# Patient Record
Sex: Female | Born: 1945
Health system: Southern US, Community
[De-identification: ages and names within clinical notes are randomized; demographics above are authoritative.]

## PROBLEM LIST (undated history)

## (undated) DIAGNOSIS — Z9289 Personal history of other medical treatment: Secondary | ICD-10-CM

## (undated) DIAGNOSIS — F329 Major depressive disorder, single episode, unspecified: Secondary | ICD-10-CM

## (undated) DIAGNOSIS — E119 Type 2 diabetes mellitus without complications: Secondary | ICD-10-CM

## (undated) DIAGNOSIS — R059 Cough, unspecified: Secondary | ICD-10-CM

## (undated) DIAGNOSIS — Z950 Presence of cardiac pacemaker: Secondary | ICD-10-CM

## (undated) DIAGNOSIS — M2550 Pain in unspecified joint: Secondary | ICD-10-CM

## (undated) DIAGNOSIS — E785 Hyperlipidemia, unspecified: Secondary | ICD-10-CM

## (undated) DIAGNOSIS — D649 Anemia, unspecified: Secondary | ICD-10-CM

## (undated) DIAGNOSIS — I495 Sick sinus syndrome: Secondary | ICD-10-CM

## (undated) DIAGNOSIS — J302 Other seasonal allergic rhinitis: Secondary | ICD-10-CM

## (undated) DIAGNOSIS — Z8489 Family history of other specified conditions: Secondary | ICD-10-CM

## (undated) DIAGNOSIS — R05 Cough: Secondary | ICD-10-CM

## (undated) DIAGNOSIS — F32A Depression, unspecified: Secondary | ICD-10-CM

## (undated) DIAGNOSIS — Z87440 Personal history of urinary (tract) infections: Secondary | ICD-10-CM

## (undated) DIAGNOSIS — M2540 Effusion, unspecified joint: Secondary | ICD-10-CM

## (undated) DIAGNOSIS — M1990 Unspecified osteoarthritis, unspecified site: Secondary | ICD-10-CM

## (undated) HISTORY — PX: CHOLECYSTECTOMY: SHX55

## (undated) HISTORY — PX: INSERT / REPLACE / REMOVE PACEMAKER: SUR710

## (undated) HISTORY — DX: Sick sinus syndrome: I49.5

## (undated) HISTORY — PX: ABDOMINAL HYSTERECTOMY: SHX81

## (undated) HISTORY — PX: COLONOSCOPY: SHX174

## (undated) HISTORY — PX: OTHER SURGICAL HISTORY: SHX169

---

## 2005-05-28 ENCOUNTER — Inpatient Hospital Stay (HOSPITAL_COMMUNITY): Admission: AD | Admit: 2005-05-28 | Discharge: 2005-06-01 | Payer: Self-pay | Admitting: Cardiology

## 2005-05-29 ENCOUNTER — Encounter: Payer: Self-pay | Admitting: Cardiology

## 2005-05-30 ENCOUNTER — Ambulatory Visit: Payer: Self-pay | Admitting: Internal Medicine

## 2005-06-14 ENCOUNTER — Ambulatory Visit: Payer: Self-pay

## 2005-06-20 ENCOUNTER — Ambulatory Visit: Payer: Self-pay | Admitting: Cardiology

## 2005-09-19 ENCOUNTER — Ambulatory Visit: Payer: Self-pay | Admitting: Internal Medicine

## 2006-06-25 ENCOUNTER — Ambulatory Visit: Payer: Self-pay

## 2006-09-26 ENCOUNTER — Ambulatory Visit: Payer: Self-pay | Admitting: Internal Medicine

## 2006-12-26 ENCOUNTER — Ambulatory Visit: Payer: Self-pay | Admitting: Internal Medicine

## 2007-03-27 ENCOUNTER — Ambulatory Visit: Payer: Self-pay | Admitting: Internal Medicine

## 2007-06-19 ENCOUNTER — Ambulatory Visit: Payer: Self-pay | Admitting: Internal Medicine

## 2007-09-26 ENCOUNTER — Ambulatory Visit: Payer: Self-pay | Admitting: Internal Medicine

## 2007-12-25 ENCOUNTER — Ambulatory Visit: Payer: Self-pay | Admitting: Internal Medicine

## 2008-03-25 ENCOUNTER — Ambulatory Visit: Payer: Self-pay | Admitting: Internal Medicine

## 2008-06-03 ENCOUNTER — Ambulatory Visit: Payer: Self-pay | Admitting: Internal Medicine

## 2008-09-02 ENCOUNTER — Ambulatory Visit: Payer: Self-pay | Admitting: Internal Medicine

## 2008-11-20 ENCOUNTER — Encounter (INDEPENDENT_AMBULATORY_CARE_PROVIDER_SITE_OTHER): Payer: Self-pay | Admitting: *Deleted

## 2008-12-04 ENCOUNTER — Encounter: Payer: Self-pay | Admitting: Internal Medicine

## 2008-12-14 ENCOUNTER — Ambulatory Visit: Payer: Self-pay | Admitting: Internal Medicine

## 2008-12-21 ENCOUNTER — Encounter: Payer: Self-pay | Admitting: Internal Medicine

## 2009-02-02 DIAGNOSIS — I10 Essential (primary) hypertension: Secondary | ICD-10-CM | POA: Insufficient documentation

## 2009-02-02 DIAGNOSIS — M069 Rheumatoid arthritis, unspecified: Secondary | ICD-10-CM | POA: Insufficient documentation

## 2009-02-02 DIAGNOSIS — I498 Other specified cardiac arrhythmias: Secondary | ICD-10-CM

## 2009-02-02 DIAGNOSIS — Z950 Presence of cardiac pacemaker: Secondary | ICD-10-CM

## 2009-02-09 ENCOUNTER — Encounter: Admission: RE | Admit: 2009-02-09 | Discharge: 2009-02-09 | Payer: Self-pay | Admitting: Rheumatology

## 2009-03-19 ENCOUNTER — Encounter: Payer: Self-pay | Admitting: Internal Medicine

## 2009-03-23 ENCOUNTER — Ambulatory Visit: Payer: Self-pay | Admitting: Internal Medicine

## 2009-04-07 ENCOUNTER — Encounter: Payer: Self-pay | Admitting: Internal Medicine

## 2009-07-26 ENCOUNTER — Telehealth: Payer: Self-pay | Admitting: Internal Medicine

## 2009-07-27 ENCOUNTER — Encounter: Payer: Self-pay | Admitting: Internal Medicine

## 2009-08-19 ENCOUNTER — Encounter: Payer: Self-pay | Admitting: Internal Medicine

## 2009-10-28 ENCOUNTER — Encounter: Payer: Self-pay | Admitting: Internal Medicine

## 2009-11-03 ENCOUNTER — Encounter: Payer: Self-pay | Admitting: Internal Medicine

## 2010-02-28 ENCOUNTER — Encounter (INDEPENDENT_AMBULATORY_CARE_PROVIDER_SITE_OTHER): Payer: Self-pay | Admitting: *Deleted

## 2010-05-27 ENCOUNTER — Encounter (INDEPENDENT_AMBULATORY_CARE_PROVIDER_SITE_OTHER): Payer: Self-pay | Admitting: *Deleted

## 2010-06-14 ENCOUNTER — Ambulatory Visit: Payer: Self-pay | Admitting: Internal Medicine

## 2010-08-25 ENCOUNTER — Encounter: Payer: Self-pay | Admitting: Internal Medicine

## 2010-08-30 NOTE — Letter (Signed)
Summary: Device-Delinquent Check  [facility], Main Office  [address]   [place], [place] [address]   Phone: [phone]  Fax: [phone]     February 28, 2010 MRN: [id number]   Tracey Koch [address] [place], [place]  [address]   Dear Ms. Robison,  According to our records, you have not had your implanted device checked in the recommended period of time.  We are unable to determine appropriate device function without checking your device on a regular basis.  Please call our office to schedule an appointment as soon as possible.  If you are having your device checked by another physician, please call us so that we may update our records.  Thank you,   [facility]

## 2010-08-30 NOTE — Assessment & Plan Note (Signed)
Summary: ROV   Visit Type:  Follow-up Primary Hellen Shanley:  Tracey Koch  CC:  no cardiology complaints.  History of Present Illness: Mrs. Frick returns today for PPM followup.  She is a pleasant 65 yo woman with a h/o symptomatic brdaycardia, s/p PPM.  She has a long h/o arthritis and has required chronic steroids and methotrexate. She denies c/p, sob, or peripheral edema.  Current Medications (verified): 1)  Paroxetine Hcl 20 Mg Tabs (Paroxetine Hcl) .... Take 1 Tab At Bedtime 2)  Aldactone 25 Mg Tabs (Spironolactone) .... Take 1 Tab Daily 3)  Tramadol Hcl 50 Mg Tabs (Tramadol Hcl) .... Take As Needed For Pain 4)  Trazodone Hcl 150 Mg Tabs (Trazodone Hcl) .... Take 1 Tab At Bedtime 5)  Methotrexate 2.5 Mg Tabs (Methotrexate Sodium) .... Take 5 Tabs Every Tuesday and Wednesday 6)  Furosemide 40 Mg Tabs (Furosemide) .... Take 1 Tab Daily 7)  Prednisone 10 Mg Tabs (Prednisone) .... Take 1 Tab Daily 8)  Aspir-Low 81 Mg Tbec (Aspirin) .... Take 1 Tab Daily 9)  Simvastatin 20 Mg Tabs (Simvastatin) .... Take 1 Tab At Bedtime 10)  Metformin Hcl 500 Mg Tabs (Metformin Hcl) .... Take 1 Tab Every Otherday  Allergies (verified): No Known Drug Allergies  Comments:  Nurse/Medical Assistant: patient takes metformin 500 mg everyother day   Past History:  Past Medical History: Last updated: 02/02/2009 ARTHRITIS, RHEUMATOID (ICD-714.0) HYPERTENSION, UNSPECIFIED (ICD-401.9) PACEMAKER (ICD-V45.Marland Kitchen01) BRADYCARDIA (ICD-427.89)    Past Surgical History: Last updated: 02/02/2009 PCM -  Medtronic Adapta ADDR-01 - Symptomatic bradycardia. 06/09/2005  Vital Signs:  Patient profile:   66 year old female Height:      65 inches Weight:      174 pounds BMI:     29.06 Pulse rate:   80 / minute BP sitting:   110 / 78  (right arm)  Vitals Entered By: Dreama Saa, CNA (June 14, 2010 3:57 PM)  Physical Exam  General:  Well developed, well nourished, in no acute distress.  HEENT:  normal Neck: supple. No JVD. Carotids 2+ bilaterally no bruits Cor: RRR no rubs, gallops or murmur Lungs: CTA Ab: soft, nontender. nondistended. No HSM. Good bowel sounds Ext: warm. no cyanosis, clubbing or edema Neuro: alert and oriented. Grossly nonfocal. affect pleasant    PPM Specifications Following MD:  Lewayne Bunting, MD     PPM Vendor:  Medtronic     PPM Model Number:  ADDR01     PPM Serial Number:  [id number] PPM DOI:  05/31/2005     PPM Implanting MD:  Lewayne Bunting, MD  Lead 1    Location: RA     DOI: 05/31/2005     Model #: 0981     Serial #: [id number]     Status: active Lead 2    Location: RV     DOI: 05/31/2005     Model #: 2130     Serial #: [id number]     Status: active  Magnet Response Rate:  BOL 85 ERI 65  Indications:  Sick sinus syndrome   PPM Follow Up Remote Check?  No Battery Voltage:  2.76 V     Battery Est. Longevity:  5.5 years     Pacer Dependent:  No       PPM Device Measurements Atrium  Amplitude: 2.8 mV, Impedance: 606 ohms, Threshold: 0.375 V at 0.4 msec Right Ventricle  Amplitude: 16 mV, Impedance: 730 ohms, Threshold: 0.5 V at 0.4 msec  Episodes MS Episodes:  4  Percent Mode Switch:  <0.1%     Coumadin:  No Ventricular High Rate:  0     Atrial Pacing:  16.6%     Ventricular Pacing:  0.4%  Parameters Mode:  DDDR+     Lower Rate Limit:  60     Upper Rate Limit:  140 Paced AV Delay:  150     Sensed AV Delay:  120 Next Remote Date:  09/15/2010     Next Cardiology Appt Due:  06/01/2011 Tech Comments:  No parameter changes.  Device function normal.  Carelink transmissions every 3 months.  ROV 1 year with  Dr. Ladona Ridgel in [place]. Altha Harm, LPN  June 14, 2010 4:32 PM  MD Comments:  Agree with above.  Impression & Recommendations:  Problem # 1:  PACEMAKER, PERMANENT (ICD-V45.01) Her device is working normally.  Will recheck in several months.  Problem # 2:  HYPERTENSION, UNSPECIFIED (ICD-401.9) Her blood pressure is well controlled. She  will continue her meds as below. Her updated medication list for this problem includes:    Aldactone 25 Mg Tabs (Spironolactone) .Marland Kitchen... Take 1 tab daily    Furosemide 40 Mg Tabs (Furosemide) .Marland Kitchen... Take 1 tab daily    Aspir-low 81 Mg Tbec (Aspirin) .Marland Kitchen... Take 1 tab daily

## 2010-08-30 NOTE — Letter (Signed)
Summary: Remote Device Check  [facility], Main Office  [address]   [place], [place] [address]   Phone: [phone]  Fax: [phone]     August 19, 2009 MRN: [id number]   Tracey Koch [address] [place], [place]  [address]   Dear [name],   Your remote transmission was recieved and reviewed by your physician.  All diagnostics were within normal limits for you.  __X___Your next transmission is scheduled for:  October 27, 2009.  Please transmit at any time this day.  If you have a wireless device your transmission will be sent automatically.     Sincerely,  Proofreader

## 2010-08-30 NOTE — Cardiovascular Report (Signed)
Summary: Office Visit Remote   Office Visit Remote   Imported By: Roderic Ovens 08/25/2009 12:26:30  _____________________________________________________________________  External Attachment:    Type:   Image     Comment:   External Document

## 2010-08-30 NOTE — Miscellaneous (Signed)
Summary: dx correction  Clinical Lists Changes  Problems: Changed problem from PACEMAKER (ICD-V45..01) to PACEMAKER, PERMANENT (ICD-V45.01)  changed the incorrect dx code to correct dx code 

## 2010-08-30 NOTE — Letter (Signed)
Summary: Remote Device Check  [facility], Main Office  [address]   [place], [place] [address]   Phone: [phone]  Fax: [phone]     November 03, 2009 MRN: [id number]   CURTINA GRILLS [address] [place], [place]  [address]   Dear Ms. Collar,   Your remote transmission was recieved and reviewed by your physician.  All diagnostics were within normal limits for you.  __X___Your next transmission is scheduled for: January 26, 2010.  Please transmit at any time this day.  If you have a wireless device your transmission will be sent automatically.      Sincerely,  Proofreader

## 2010-08-30 NOTE — Cardiovascular Report (Signed)
Summary: Office Visit Remote   Office Visit Remote   Imported By: Roderic Ovens 11/05/2009 14:55:29  _____________________________________________________________________  External Attachment:    Type:   Image     Comment:   External Document

## 2010-09-01 NOTE — Miscellaneous (Signed)
Summary: corrected device information  Clinical Lists Changes  Observations: Added new observation of PPM SERL#: [id number] (08/25/2010 19:32)      PPM Specifications Following MD:  Lewayne Bunting, MD     PPM Vendor:  Medtronic     PPM Model Number:  ADDR01     PPM Serial Number:  [id number] PPM DOI:  05/31/2005     PPM Implanting MD:  Lewayne Bunting, MD  Lead 1    Location: RA     DOI: 05/31/2005     Model #: 6301     Serial #: [id number]     Status: active Lead 2    Location: RV     DOI: 05/31/2005     Model #: 7322     Serial #: [id number]     Status: active  Magnet Response Rate:  BOL 85 ERI 65  Indications:  Sick sinus syndrome   PPM Follow Up Pacer Dependent:  No      Episodes Coumadin:  No  Parameters Mode:  DDDR+     Lower Rate Limit:  60     Upper Rate Limit:  140 Paced AV Delay:  150     Sensed AV Delay:  120

## 2010-09-15 ENCOUNTER — Encounter (INDEPENDENT_AMBULATORY_CARE_PROVIDER_SITE_OTHER): Payer: Medicare Other

## 2010-09-15 ENCOUNTER — Encounter: Payer: Self-pay | Admitting: Internal Medicine

## 2010-09-15 DIAGNOSIS — I495 Sick sinus syndrome: Secondary | ICD-10-CM

## 2010-10-17 ENCOUNTER — Encounter: Payer: Self-pay | Admitting: *Deleted

## 2010-10-27 NOTE — Letter (Signed)
Summary: Remote Device Check  [facility], Main Office  [address]   [place], [place] [address]   Phone: [phone]  Fax: [phone]     October 17, 2010 MRN: [id number]   Tracey Koch [address] [place], [place]  [address]   Dear [name],   Your remote transmission was recieved and reviewed by your physician.  All diagnostics were within normal limits for you.  __X___Your next transmission is scheduled for: 12-15-2010.  Please transmit at any time this day.  If you have a wireless device your transmission will be sent automatically.   Sincerely,  Vella Kohler

## 2010-10-27 NOTE — Cardiovascular Report (Signed)
Summary: Office Visit   Office Visit   Imported By: Roderic Ovens 10/18/2010 15:30:02  _____________________________________________________________________  External Attachment:    Type:   Image     Comment:   External Document

## 2010-12-13 NOTE — Assessment & Plan Note (Signed)
[facility]                         ELECTROPHYSIOLOGY OFFICE NOTE   Tracey, Koch                      MRN:          [id number]  DATE:06/19/2007                            DOB:          07-Dec-1945    HISTORY:  Tracey Koch returns today for followup.  She is a very pleasant  woman with a history of symptomatic bradycardia, hypertension, diabetes  and problems with arthritis, who returns today for followup.  Her main  complaint today is that of pain in her knees and ankles bilaterally.  She has had evaluation by Dr. Kellie Simmering in [place] and also is followed  by Dr. Margo Common.  She continues to have problems.  She is on some Vicodin  for pain.  She denies palpitations.  She denies chest pain.  She denies  shortness of breath.   MEDICATIONS:  1. Zocor 20 a day.  2. Trazodone 150 mg nightly.  3. Metformin 500 twice daily.  4. Neurontin 300 nightly.  5. Aspirin.  6. Diovan/HCTZ.  7. Cymbalta.   PHYSICAL EXAMINATION:  GENERAL:  She is a pleasant middle-aged woman in  no acute distress.  VITAL SIGNS:  Blood pressure 100/72, pulse 76 and regular, respirations  18, weight 213 pounds.  NECK:  Revealed no jugular venous distention.  LUNGS:  Clear bilaterally to auscultation.  No wheezes, rales or rhonchi  were present.  CARDIOVASCULAR:  Regular rate and rhythm.  Normal S1 and S2.  EXTREMITIES:  Demonstrated 1+ peripheral edema bilaterally.   PROCEDURE:  Interrogation of her pacemaker demonstrates a Medtronic  Adapta.  P-waves were greater than 2.  R-waves are 16.  Impedance 375 in  the atrium, 750 in the ventricle.  The threshold is 0.5 to 0.4 and 0.75  to 0.5 in the V.  The battery voltage was 2.78 volts.  There were 10  mode switches for less than 0.1% of the time.  There were no ventricular  high rates noted.   IMPRESSION:  1. Symptomatic bradycardia.  2. Status post pacemaker insertion.  3. Hypertension.   DISCUSSION:  Overall, Tracey Koch  is stable from a cardiac perspective.  I have asked her to continue her medications.  She is to remain on a low-  sodium diet.  She will follow up with the pacemaker clinic in 1 year.     Doylene Canning. Ladona Ridgel, MD  Electronically Signed    GWT/MedQ  DD: 06/19/2007  DT: 06/20/2007  Job #: [id number]

## 2010-12-13 NOTE — Assessment & Plan Note (Signed)
[facility]                         ELECTROPHYSIOLOGY OFFICE NOTE   CAMYLLE, WHICKER                      MRN:          [id number]  DATE:06/03/2008                            DOB:          1946/03/29    Tracey Koch returns today for followup.  She is a very pleasant 65-year-  old woman with a history of symptomatic bradycardia status post  pacemaker insertion.  She also has very severe arthritis and has been  followed by Dr. Kellie Simmering.  She has a history of hypertension.  The  patient, in the interim, has done well with the exception of difficulty  with her arthritis.  She denies chest pain.  She denies shortness of  breath.  She is limited by pain in her feet, ankles, and knees when she  walks.  She denies palpitations.   MEDICINES:  1. Zocor 20 a day.  2. Trazodone 150 nightly.  3. Metformin 500 twice daily.  4. Neurontin 300 a day.  5. Aspirin 81 a day.  6. Diovan 160 a day.  7. Lantus insulin nightly.  8. Glipizide 5 a day.  9. Aldactone 25 twice a day.  10.Folate 800 mcg daily.  11.Methotrexate 2 mg, Tuesday and Wednesday.  12.Lasix 40 two tablets daily.   PHYSICAL EXAMINATION:  GENERAL:  She is a pleasant middle-aged woman, in  no acute distress.  VITAL SIGNS:  Blood pressure was 100/66, pulse 70 and regular,  respirations were 18, and the weight was 207 pounds.  NECK:  No jugular venous distention.  LUNGS:  Clear bilaterally to auscultation.  No wheezes, rales, or  rhonchi are present.  There is no increased work of breathing.  CARDIOVASCULAR:  Regular rate and rhythm.  Normal S1 and S2.  EXTREMITIES:  Demonstrated no edema.   Interrogation of her pacemaker demonstrates a Medtronic Adapta.  The P-  waves were greater than 2, the R-waves 16, the impedance 474 in the A  and 629 in the V, and the threshold is 0.5 at 0.4 in the A and 0.65 at  0.4 in the RV.  The battery voltage was 2.78 volts.  The patient had 17-  mode switches less  than 0.1% at the time.  These rates were typically  around 200-150 beats per minute in the atrium.  Maximum duration of  these was 7 seconds.   IMPRESSION:  1. Symptomatic bradycardia.  2. Status post pacemaker insertion.  3. Hypertension.  4. Rheumatoid arthritis.   DISCUSSION:  Overall, Ms. Wehrenberg is stable.  Her arthritis is still  being followed by Dr. Kellie Simmering.  Her blood pressure is well controlled.  We will plan to see her for pacemaker followup in 1 year.     Doylene Canning. Ladona Ridgel, MD  Electronically Signed    GWT/MedQ  DD: 06/03/2008  DT: 06/03/2008  Job #: [id number]   cc:   Wyvonnia Lora

## 2010-12-15 ENCOUNTER — Other Ambulatory Visit: Payer: Self-pay | Admitting: Internal Medicine

## 2010-12-15 ENCOUNTER — Ambulatory Visit (INDEPENDENT_AMBULATORY_CARE_PROVIDER_SITE_OTHER): Payer: Medicare Other | Admitting: *Deleted

## 2010-12-15 DIAGNOSIS — I495 Sick sinus syndrome: Secondary | ICD-10-CM

## 2010-12-15 DIAGNOSIS — I498 Other specified cardiac arrhythmias: Secondary | ICD-10-CM

## 2010-12-16 NOTE — Cardiovascular Report (Signed)
NAMESAGAN, WURZEL             ACCOUNT NO.:  [id number]   MEDICAL RECORD NO.:  [id number]          PATIENT TYPE:  INP   LOCATION:  2926                         FACILITY:  [facility]   PHYSICIAN:  Arvilla Meres, M.D. [facility] OF BIRTH:  1945/11/05   DATE OF PROCEDURE:  05/29/2005  DATE OF DISCHARGE:                              CARDIAC CATHETERIZATION   Wyvonnia Lora, M.D. in [place].   PATIENT IDENTIFICATION:  Ms. Borgmeyer is a delightful 65 year old woman with  multiple cardiac risk factors including diabetes, hypertension,  hyperlipidemia and ongoing tobacco use who was transferred from [facility] for  further management of chest pain. While at [facility] she ruled out  for myocardial infarction with serial cardiac markers.  However, her course  was complicated by multiple sinus pauses with several between five and six  seconds.  These were asymptomatic.  She is transferred for further  evaluation and cardiac catheterization to clearly define her coronary  anatomy.   PROCEDURES PERFORMED:  1.  Selective coronary angiography.  2.  Left heart cath.  3.  Left ventriculogram.   DESCRIPTION OF PROCEDURE:  A 5-French arterial sheath was placed in the  right femoral artery using a modified Seldinger technique.  Standard  catheters including JL-4, JR-4 and angled pigtail were used for the  procedure.  All catheter changes were made over wire.  There were no  apparent complications.   CONTRAST:  80 mL.   HEMODYNAMIC RESULTS:  1.  Central aortic pressure was 146/76 with a mean of 103.  LV was      134/11/21.  There was no gradient on aortic valve pullback.   1.  Left main:  Was long.  No angiographic CAD.  2.  LAD was a moderate size vessel.  It gave off a very large proximal      septal perforator and a large diagonal in the mid section. There is no      angiographic CAD.  The distal LAD was small.  There was a minor luminal      irregularity in the proximal portion.  3.  Left  circumflex was a large vessel.  It gave off a large branching OM-1,      a large OM-2 and a tiny OM-3.  There is no angiographic CAD.  4.  Right coronary artery was a large dominant vessel.  It gave off a large      PDA, a small PL and a large PL.  There was a minor irregularity in the      proximal portion.   1.  Left ventriculogram done in the RAO approach showed an EF of 65-70% with      no significant wall motion abnormalities or mitral regurgitation.   ASSESSMENT:  1.  Normal coronary arteries.  2.  Normal LV function.  3  Sinus pauses.   PLAN:  She will be evaluated by EP for possible pacemaker.  She will also  need risk factor modification including smoking cessation.      Arvilla Meres, M.D. [facility]  Electronically Signed     DB/MEDQ  D:  05/29/2005  T:  05/29/2005  Job:  [id number]   cc:   Wyvonnia Lora  Fax: [phone]

## 2010-12-16 NOTE — H&P (Signed)
Tracey Koch, CAISSE             ACCOUNT NO.:  [id number]   MEDICAL RECORD NO.:  [id number]          PATIENT TYPE:  INP   LOCATION:  2926                         FACILITY:  [facility]   PHYSICIAN:  Arvilla Meres, M.D. [facility] OF BIRTH:  [birth year]   DATE OF ADMISSION:  05/28/2005  DATE OF DISCHARGE:                                HISTORY & PHYSICAL   PRIMARY CARE PHYSICIAN:  Dr. Margo Common in [place].   REASON FOR ADMISSION:  Shoulder pain radiating to the left arm, questionable  unstable angina, complicated by sinus pauses.   HISTORY OF PRESENT ILLNESS:  Ms. Tracey Koch is a very pleasant 65 year old woman  with multiple cardiac risk factors, but no known history of coronary  disease. She tells me she had a stress test many years ago which was  negative. She has never had a cardiac catheterization.   She reports a several day history of bilateral shoulder soreness which was  nonexertional. There was no chest pain or shortness of breath.  Yesterday  morning she was sitting in bed and she had developed severe pain in both  shoulders radiating down to her left arm. There was no associated chest  pain, shortness of breath, diaphoresis, nausea, or vomiting. The pain lasted  one hour. Given the continuing pain she went to the [facility] emergency room  for further evaluation. On arrival to the emergency room she was pain free.  She was admitted for rule out myocardial infarction. She had subsequent  three sets of cardiac markers which were negative for any evidence of  ischemia. She has not had any recurrent pain. However, while in the  hospital, she had several episodes of sinus pauses with two episodes up to  5.5 seconds. These were asymptomatic. She denies any history of  palpitations. She has never had syncope or presyncope. Currently she is  quite comfortable.   REVIEW OF SYSTEMS:  As per HPI and problem list. Otherwise, all systems  negative.   PAST MEDICAL HISTORY:  1.  Diabetes mellitus,  type 2, times ten years.  2.  Hypertension.  3.  Hyperlipidemia.  4.  Tobacco use, ongoing.      1.  One pack per day times 40 years.      2.  Family history of coronary artery disease and conduction disease.  5.  Obesity.   CURRENT MEDICATIONS:  1.  Zocor 20 mg daily.  2.  Diovan 160 mg daily.  3.  Hydrochlorothiazide 25 mg daily.  4.  Allegra D 180 mg daily.  5.  Trazodone 150 mg q.h.s. p.r.n.  6.  Aspirin 81 mg daily.  7.  Neurontin 300 mg p.r.n.  8.  Metformin 500 mg b.i.d.  9.  Vitamin C.  10. Calcium.  11. Tylenol p.r.n.   ALLERGIES:  PENICILLIN and CODEINE which causes swelling; SULFA which causes  a rash; LATEX.   SOCIAL HISTORY:  She is married, she lives in [place]. 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 She has a history of tobacco use one pack per day times 40  years, ongoing. She denies any significant alcohol use.   FAMILY  HISTORY:  Mother died at 15 due to lung disease. Father died at 63.  He had a history of stroke as well as coronary artery disease, status post  multiple CABGs and angioplasties. She has six brothers and sisters total.  The younger brother has a pacemaker, a sister has an AICD, and her oldest  brother has coronary disease with an MI.   PHYSICAL EXAMINATION:  GENERAL: She is well appearing, in no acute distress,  able to lie flat.  VITAL SIGNS: Respirations are unlabored. She is afebrile. Blood pressure is  130/68, heart rate 72, she is saturating 100% on room air.  HEENT: Sclerae anicteric. EOMI. There is no xanthelasma. Mucous membranes  are moist.  NECK: Supple. There is no JVD. Carotids are 2+ bilaterally without any  bruits. There is no lymphadenopathy or thyromegaly.  CARDIAC: Regular rate and rhythm. Normal S1 and S2. PMI is nondisplaced.  There are no murmurs, rubs, or gallops.  LUNGS: Mildly rhonchrous with very occasional end-expiratory wheeze. There  are no rales.  ABDOMEN: Mildly obese, soft, nontender, nondistended with no   hepatosplenomegaly, no bruits, no masses.  EXTREMITIES: Warm with no clubbing, cyanosis, or edema. Femoral pulses are  2+ bilaterally with no bruits. DP pulses are 2+ bilaterally.  NEUROLOGIC: She is alert and oriented times three with a very pleasant  affect. Cranial nerves II-XII are intact. She moves all four extremities  without any difficulties.   Bloodwork shows a sodium of 138, potassium 3.9, BUN 14, creatinine 0.9.  Cardiac markers reveal total CK, first set was 60 with an MB of 1.0,  troponin of 0.09; second set 78 and 1.1 with a troponin of 0.09; the third  set revealed CPK of 60, MB of 0.8, and troponin of 0.01.  INR is 1.0. White  count 6.7, hemoglobin 14.1, platelet count 287,000.   EKG shows normal sinus rhythm at 75 beats per minute with no ST-T wave  changes. Her intervals were normal with a PR interval of 186, QRS of 82  milliseconds, and QTC of 460 milliseconds.   ASSESSMENT/PLAN:  Ms. Tracey Koch is a 65 year old woman as above with multiple  cardiac risk factors, but no known history of coronary artery disease, who  was admitted with shoulder pain radiating to her left arm. This has both  typical and atypical features. This has been complicated by significant  sinus pauses ranging from 2.5 to 5.5 seconds which was asymptomatic. Given  her multiple cardiac risk factors and evidence of conduction disease, I do  think it is appropriate to proceed with cardiac catheterization to clearly  define her coronary anatomy. Should she not have any significant cardiac  disease she would likely need to be seen by EP for the possibility of a  permanent pacemaker.   PLAN:  1.  Admit to stepdown.  2.  Proceed with cardiac catheterization in the morning (hold Metformin).  3.  If cardiac catheterization negative, EP to see for question of      pacemaker.  4.  Hold all AV nodal blockers.  5.  Check echocardiogram and TSH. 6.  Atropine and Zoll's at bedside.  7.  Smoking cessation  consult.      Arvilla Meres, M.D. [facility]  Electronically Signed     DB/MEDQ  D:  05/28/2005  T:  05/28/2005  Job:  [id number]   cc:   Wyvonnia Lora  Fax: [phone]

## 2010-12-16 NOTE — Op Note (Signed)
NAMECYLA, Tracey Koch             ACCOUNT NO.:  [id number]   MEDICAL RECORD NO.:  [id number]          PATIENT TYPE:  INP   LOCATION:  2926                         FACILITY:  [facility]   PHYSICIAN:  Doylene Canning. Ladona Ridgel, M.D.  DATE OF BIRTH:  01-Jun-1946   DATE OF PROCEDURE:  06/09/2005  DATE OF DISCHARGE:                                 OPERATIVE REPORT   PROCEDURE PERFORMED:  Implantation of dual-chamber pacemaker.   INDICATIONS:  Symptomatic bradycardia.   INTRODUCTION:  The patient is a 65 year old woman with a history of diabetes  and hypertension, who was subsequently found to have pauses of over 5  seconds. She is admitted for pacemaker implantation.   DESCRIPTION OF PROCEDURE:  After informed consent was obtained, the patient  was taken to the diagnostic EP lab in a fasting state. After the usual  preparation and draping, intravenous fentanyl and midazolam was given for  sedation. Lidocaine 30 cc was infiltrated in the left infraclavicular  region. A 5 cm incision was carried out over this region. Electrocautery  utilized to dissect down to the fascial plane. The left subclavian vein was  punctured x2, and the Medtronic Model 5076 52-cm active fixation pacing lead  (serial number [id number]) was advanced into the right ventricle.  Then the  Medtronic Model 5076 45-cm active fixation pacing lead (serial number  [id number]) was advanced to the right atrium. Mapping was carried out in the  right ventricle.   At the final site at the RV septum, the R-waves measured 19 mV and the  pacing threshold with the lead actively fixed was 0.6 volts at 0.5  milliseconds. The pacing impedance was 889 ohms. Pacing at 10 V did not  stimulate the diaphragm.   With the ventricular lead in satisfactory position, attention was then  turned to the atrial lead. It was placed the right atrial appendage, where P-  waves measured 5 mV and the pacing threshold 1.2 volts at 0.5 milliseconds.  The pacing  impedance was 724 ohms. Pacing at 10 V again did not stimulate  the diaphragm with the lead actively fixed.   At this point, the leads were secured to subpectoralis fascia with a figure-  of-eight silk suture. In addition, the sewing sleeve was secured with silk  suture. Electrocautery was utilized to make a subcutaneous pocket. Kanamycin  irrigation was utilized to irrigate the pocket. Electrocautery was utilized  to assure hemostasis.   Next, the Medtronic Adapta ADDR-01 dual-chamber pacemaker (serial number  [id number]) was connected to the atrial and ventricular pacing leads, then  placed in the subcutaneous pocket. Generator was secured to the skin with  silk suture. Kanamycin irrigation was utilized to irrigate the pocket. The  incision closed with layer of 2-0 Vicryl, followed by a layer of 3-0 Vicryl,  followed by a layer of 4-0 Vicryl. Benzoin was painted on the skin and Steri-  Strips were applied.  A pressure dressing was placed. The patient was  returned to her room in satisfactory condition.   COMPLICATIONS:  There were no immediate procedure complications.   RESULTS:  Successful implantation of Medtronic dual-chamber pacemaker,  in a  patient with symptomatic bradycardia.           ______________________________  Doylene Canning. Ladona Ridgel, M.D.     GWT/MEDQ  D:  05/31/2005  T:  05/31/2005  Job:  [id number]   cc:   Wyvonnia Lora  Fax: [phone]

## 2010-12-16 NOTE — Discharge Summary (Signed)
Tracey Koch, Tracey Koch             ACCOUNT NO.:  [id number]   MEDICAL RECORD NO.:  [id number]          PATIENT TYPE:  INP   LOCATION:  4712                         FACILITY:  [facility]   PHYSICIAN:  Arvilla Meres, M.D. LHCDATE OF BIRTH:  Jul 13, 1946   DATE OF ADMISSION:  05/28/2005  DATE OF DISCHARGE:  06/01/2005                                 DISCHARGE SUMMARY   ADDENDUM DISCHARGE SUMMARY   LABORATORY DATA:  Her admission troponin-I studies were 0.09, 0.09, 0.10.  Her serum electrolytes on admission May 28, 2005 sodium 138, potassium  3.9, BUN 14, creatinine 0.9.  The complete blood count May 30, 2005  white cells 7, hemoglobin 13.9, hematocrit 40.7, platelet count 285,000.  TSH was 5.662.  Hemoglobin A1C was 6.7.  Lipid profile showing cholesterol  190, triglycerides 166, HDL cholesterol 33, LDL cholesterol 124.   Patient with elevated LDL in setting of diabetes, would probably benefit  from increasing Statin dosage.  Right now she is on Zocor 20.  She could  probably benefit from Vytorin 10/80 daily at bedtime.      Maple Mirza, P.A.      Arvilla Meres, M.D. [facility]  Electronically Signed    GM/MEDQ  D:  06/01/2005  T:  06/01/2005  Job:  [id number]   cc:   Wyvonnia Lora  Fax: [phone]   [facility], [facility]

## 2010-12-16 NOTE — Discharge Summary (Signed)
NAMEVELERA, LANSDALE             ACCOUNT NO.:  [id number]   MEDICAL RECORD NO.:  [id number]          PATIENT TYPE:  INP   LOCATION:  4712                         FACILITY:  [facility]   PHYSICIAN:  Arvilla Meres, M.D. LHCDATE OF BIRTH:  [birth year]   DATE OF ADMISSION:  05/28/2005  DATE OF DISCHARGE:  06/01/2005                                 DISCHARGE SUMMARY   Duration discharge encounter 37 minutes all told, including patient  instruction for pacemaker.   ALLERGIES:  LATEX, PENICILLIN, CODEINE, SULFA, MOLDS, RAGWEED.   DISCHARGE DIAGNOSES:  1.  Chest pain, possible unstable angina in patient with multiple risk      factors.  Troponin-I studies are negative, however.  2.  Discharging day #1, status post implantation of dual chamber Medtronic      ADAPTA ADDR-O1 pacemaker.  3.  Sinus pauses this admission, greater than 5 seconds (asymptomatic).  4.  Left heart catheterization May 29, 2005 demonstrating normal      coronaries with ejection fraction of 65 to 70%.  5.  2-dimensional echocardiogram May 29, 2005.  The left ventricle      images are suboptimal.  No definite segmental wall motion abnormalities.      Overall left ventricular function appears to be low normal.  Trivial      tricuspid regurgitation.  No pericardial effusion.   SECONDARY DIAGNOSES:  1.  Type 2 diabetes mellitus diagnosed 10 years ago.  2.  Hypertension.  3.  Dyslipidemia.  4.  Ongoing tobacco habituation.  5.  Family history of coronary artery disease.   PROCEDURE:  1.  May 29, 2005 left heart catheterization.  This study showed the left      anterior descending had proximal luminal irregularities but no flow-      limiting stenoses.  Left circumflex was without significant coronary      artery disease as was the left main free of significant coronary artery      disease.  The right coronary artery had luminal irregularities in the      proximal to mid point but no evidence of stenosis.   Ejection fraction      estimated on ventriculogram at 65 to 70%.  No wall motion abnormalities.      The patient is experiencing sinuses pauses, however, and will need      permanent pacemaker as well as risk factor reduction going forward.  2.  2-dimensional echocardiogram on May 29, 2005. Study showing left      ventricle images suboptimal, no definite segmental wall motion      abnormalities. Overall left ventricular function is low normal.  Trivial      tricuspid valvular regurgitation.  There is mild mitral annular      calcification, mild fibrocalcific change of the aortic root.  Pulmonary      valve not well visualized.  Right atrial size is normal.  No pericardial      effusion.   DISPOSITION:  Tracey Koch is discharging day #1 after implantation  of permanent pacemaker for sinus pauses greater than 5 seconds.  She is  maintaining  sinus rhythm in the post implantation period.  She is not having  any dizziness or syncope.  She is achieving 95% oxygen saturation on room  air.  Her blood pressure is 118/79. She remains afebrile.  The pacemaker  incision is without swelling, erythema or drainage and is not particularly  tender.  The patient is doing well with Tylenol 325 mg one to two tablets  every 4-6 hours as an analgesic.  Mobility of the left arm has been  discussed with the patient and follow up chest x-ray on June 01, 2005  shows the leads are in appropriate position and there is no pneumothorax.  The device has been interrogated without any changes.  All values within  normal limits.  The patient is asked to keep her incision dry for the next 7  days, to sponge bathe until Wednesday, June 07, 2005.  Her discharge diet  is low sodium, low cholesterol, diabetic diet.  She is asked not to drive  for the next week and not to lift any heavy weight more than 10 pounds for  the next four weeks.   DISCHARGE MEDICATIONS:  1.  Metformin 500 mg twice daily to start on  Saturday June 03, 2005.  2.  Enteric coated aspirin 81 mg daily.  3.  Zocor 20 mg daily at bedtime.  4.  Diovan 160 mg daily.  5.  Hydrochlorothiazide 25 mg daily.  6.  Allegra-D 180 mg daily.  7.  Trazodone 150 mg at bedtime as needed.  8.  Neurontin 300 mg as needed.   FOLLOW UP:  Follow up is at [facility] at [address].  P[facility] Clinic, Wednesday June 14, 2005 at 9:45 in the morning.  She will  see Dr. Ladona Ridgel September 19, 2005 at noon and she sees Dr. Arvilla Meres  on Tuesday, June 20, 2005 at 1:30 in the [place] office of [facility].   HISTORY OF PRESENT ILLNESS:  Briefly, Tracey Koch is a 65 year old female who  has a history of diabetes, hypertension, dyslipidemia and ongoing tobacco  use but no history of coronary artery disease.  She had a stress test many  years ago which, per the patient, was negative.  She has never had left  heart catheterization.   She has a several day history of bilateral shoulder soreness.  It is not  exertional.  Apparently it began when she was sitting in bed and she has  severe bilateral shoulder pain which radiated to the left arm. She presented  to [facility] emergency room with continued pain and was admitted to  rule out myocardial infarction.  She had no recurrent pain at the hospital  and troponin-I studies were negative.  The electrocardiogram was  nondiagnostic for acute myocardial infarction.  On telemetry, however at  [facility], it was noted she had several sinus pauses ranging from 2.5 to 5.5  seconds.  She has now been transferred to [facility].  She  has had no history of syncope, pre-syncope or palpitations.  Despite the  patient's negative troponin-I studies and atypical presentation chest pain,  the patient does have multiple risk factors for coronary artery disease including diabetes, hypertension, smoking, family history and dyslipidemia.  The patient was admitted  to [facility] in transfer.  Troponin's  were 0.09, then 0.09 then 0.1.  Admission hemoglobin was 14.1.  Admission  potassium 3.9.  Admission creatinine on May 28, 2005 0.9.  (All these  were  on May 28, 2005).  Plan is for Atropine by the bedside and all AV  node blockers will be held.  She will be planned for left heart  catheterization May 29, 2005.  To this end her metformin will be held.  If the catheterization is negative she will then have electrophysiology  consult.   HOSPITAL COURSE:  Briefly, the patient presented from [facility] with  history of chest pain, negative troponin-I studies, but sinus pauses on  telemetry.  She underwent left heart catheterization on hospital day #2 with  the results as dictated above.  She has no coronary anatomy and ejection  fraction well preserved at 65% to 70%.  She then underwent echocardiogram  the same day as the catheterization.  The study shows left ventricle images  to be suboptimal.  She was seen then by electrophysiology who recommended  implantation of percutaneous transvenous demand pacemaker.  Risks and  benefits had been explained to the patient and they agreed to proceed.  This  procedure was performed on May 31, 2005.  A DDD dual chamber pacemaker  was implanted, a Medtronic  ADAPTA ADDR-01.  She has had no post pacemaker implantation complications.  She has a potassium of 3.4 on the day of discharge which will be  supplemented prior to discharge.  She goes home with the medications and  follow up as dictated.      Maple Mirza, P.A.      Arvilla Meres, M.D. [facility]  Electronically Signed    GM/MEDQ  D:  06/01/2005  T:  06/01/2005  Job:  [id number]   cc:   Doylene Canning. Ladona Ridgel, M.D.  [address]  [address]  [place]  [place] [address]   Wyvonnia Lora  Fax: [phone]

## 2010-12-16 NOTE — Assessment & Plan Note (Signed)
[facility]                         ELECTROPHYSIOLOGY OFFICE NOTE   SHARMEL, BALLANTINE                      MRN:          [id number]  DATE:06/25/2006                            DOB:          1946-03-06    Ms. Gatchel is seen in the device clinic today, June 25, 2006, for  routine followup of her Medtronic Adapta ADDR01 dual-chamber pacemaker  implanted November 2006 for sick sinus syndrome by Dr. Ladona Ridgel. Upon  interrogation, battery voltage is 2.78 volts. She is A sense, Z sense  99.6 % of the time. The __________  intrinsic amplitude is greater than  5.6 millivolts with an impedance of 510 ohms and threshold of 0.5 volts  at 0.4 msec. In the right ventricle intrinsic amplitude is 16 millivolts  with an impedance of 598 ohms and threshold of 0.75 volts at 0.4 msec.  She has a adaptive A and V capture turned on, rate response was  activated at this visit. She will begin [facility] transmissions at 3, 6  and 9 month intervals and be seen in the device clinic in 1 year's time  for followup.      Cleatrice Burke, RN  Electronically Signed      Doylene Canning. Ladona Ridgel, MD  Electronically Signed   CF/MedQ  DD: 06/25/2006  DT: 06/25/2006  Job #: [id number]

## 2010-12-21 NOTE — Progress Notes (Signed)
Pacer remote check  

## 2010-12-22 ENCOUNTER — Encounter: Payer: Self-pay | Admitting: *Deleted

## 2011-03-16 ENCOUNTER — Other Ambulatory Visit: Payer: Self-pay | Admitting: Internal Medicine

## 2011-03-16 ENCOUNTER — Encounter: Payer: Self-pay | Admitting: Internal Medicine

## 2011-03-16 ENCOUNTER — Ambulatory Visit (INDEPENDENT_AMBULATORY_CARE_PROVIDER_SITE_OTHER): Payer: Medicare Other | Admitting: *Deleted

## 2011-03-16 DIAGNOSIS — I495 Sick sinus syndrome: Secondary | ICD-10-CM

## 2011-03-17 ENCOUNTER — Ambulatory Visit (INDEPENDENT_AMBULATORY_CARE_PROVIDER_SITE_OTHER): Payer: Medicare Other | Admitting: Urology

## 2011-03-17 DIAGNOSIS — N3942 Incontinence without sensory awareness: Secondary | ICD-10-CM

## 2011-03-17 DIAGNOSIS — N329 Bladder disorder, unspecified: Secondary | ICD-10-CM

## 2011-03-17 LAB — REMOTE PACEMAKER DEVICE
AL AMPLITUDE: 1.4 mv
AL IMPEDENCE PM: 543 Ohm
AL THRESHOLD: 0.5 V
BAMS-0001: 175 {beats}/min
BATTERY VOLTAGE: 2.76 V
RV LEAD AMPLITUDE: 11.2 mv
RV LEAD IMPEDENCE PM: 662 Ohm
RV LEAD THRESHOLD: 0.625 V
VENTRICULAR PACING PM: 0

## 2011-03-24 NOTE — Progress Notes (Signed)
Pacer remote check  

## 2011-04-06 ENCOUNTER — Inpatient Hospital Stay (HOSPITAL_COMMUNITY): Admission: RE | Admit: 2011-04-06 | Payer: Medicare Other | Source: Ambulatory Visit | Admitting: Orthopedic Surgery

## 2011-04-12 ENCOUNTER — Encounter: Payer: Self-pay | Admitting: *Deleted

## 2011-05-17 ENCOUNTER — Other Ambulatory Visit: Payer: Self-pay | Admitting: Orthopedic Surgery

## 2011-05-17 ENCOUNTER — Other Ambulatory Visit (HOSPITAL_COMMUNITY): Payer: Self-pay | Admitting: Orthopedic Surgery

## 2011-05-17 ENCOUNTER — Encounter (HOSPITAL_COMMUNITY): Payer: Medicare Other

## 2011-05-17 ENCOUNTER — Ambulatory Visit (HOSPITAL_COMMUNITY)
Admission: RE | Admit: 2011-05-17 | Discharge: 2011-05-17 | Disposition: A | Discharge status: Home / Self Care | Payer: Medicare Other | Source: Ambulatory Visit | Attending: Orthopedic Surgery | Admitting: Orthopedic Surgery

## 2011-05-17 DIAGNOSIS — M1710 Unilateral primary osteoarthritis, unspecified knee: Secondary | ICD-10-CM | POA: Insufficient documentation

## 2011-05-17 DIAGNOSIS — Z01818 Encounter for other preprocedural examination: Secondary | ICD-10-CM

## 2011-05-17 DIAGNOSIS — Z01812 Encounter for preprocedural laboratory examination: Secondary | ICD-10-CM | POA: Insufficient documentation

## 2011-05-17 DIAGNOSIS — Z01810 Encounter for preprocedural cardiovascular examination: Secondary | ICD-10-CM | POA: Insufficient documentation

## 2011-05-17 DIAGNOSIS — Z950 Presence of cardiac pacemaker: Secondary | ICD-10-CM | POA: Insufficient documentation

## 2011-05-17 LAB — CBC
HCT: 38.1 % (ref 36.0–46.0)
Hemoglobin: 12.7 g/dL (ref 12.0–15.0)
MCH: 29.1 pg (ref 26.0–34.0)
MCHC: 33.3 g/dL (ref 30.0–36.0)
MCV: 87.2 fL (ref 78.0–100.0)
RBC: 4.37 MIL/uL (ref 3.87–5.11)
RDW: 14.7 % (ref 11.5–15.5)
WBC: 8 10*3/uL (ref 4.0–10.5)

## 2011-05-17 LAB — DIFFERENTIAL
Basophils Absolute: 0 10*3/uL (ref 0.0–0.1)
Basophils Relative: 0 % (ref 0–1)
Eosinophils Absolute: 0.1 10*3/uL (ref 0.0–0.7)
Eosinophils Relative: 2 % (ref 0–5)
Lymphocytes Relative: 14 % (ref 12–46)
Lymphs Abs: 1.2 10*3/uL (ref 0.7–4.0)
Monocytes Absolute: 0.6 10*3/uL (ref 0.1–1.0)
Monocytes Relative: 8 % (ref 3–12)
Neutro Abs: 6.1 10*3/uL (ref 1.7–7.7)
Neutrophils Relative %: 76 % (ref 43–77)

## 2011-05-17 LAB — URINALYSIS, ROUTINE W REFLEX MICROSCOPIC
Bilirubin Urine: NEGATIVE
Glucose, UA: NEGATIVE mg/dL
Ketones, ur: NEGATIVE mg/dL
Protein, ur: NEGATIVE mg/dL
Urobilinogen, UA: 0.2 mg/dL (ref 0.0–1.0)
pH: 6.5 (ref 5.0–8.0)

## 2011-05-17 LAB — COMPREHENSIVE METABOLIC PANEL
ALT: 11 U/L (ref 0–35)
Albumin: 3.6 g/dL (ref 3.5–5.2)
Alkaline Phosphatase: 114 U/L (ref 39–117)
BUN: 19 mg/dL (ref 6–23)
CO2: 25 mEq/L (ref 19–32)
Calcium: 9.7 mg/dL (ref 8.4–10.5)
Chloride: 100 mEq/L (ref 96–112)
Creatinine, Ser: 1.18 mg/dL — ABNORMAL HIGH (ref 0.50–1.10)
GFR calc non Af Amer: 47 mL/min — ABNORMAL LOW (ref >90)
Glucose, Bld: 132 mg/dL — ABNORMAL HIGH (ref 70–99)
Potassium: 4.5 mEq/L (ref 3.5–5.1)
Total Bilirubin: 0.6 mg/dL (ref 0.3–1.2)
Total Protein: 7.6 g/dL (ref 6.0–8.3)

## 2011-05-17 LAB — SURGICAL PCR SCREEN
MRSA, PCR: NEGATIVE
Staphylococcus aureus: NEGATIVE

## 2011-05-17 LAB — URINE MICROSCOPIC-ADD ON

## 2011-05-17 LAB — PROTIME-INR: INR: 1.11 (ref 0.00–1.49)

## 2011-05-17 LAB — ABO/RH: ABO/RH(D): O POS

## 2011-05-18 ENCOUNTER — Telehealth: Payer: Self-pay | Admitting: Internal Medicine

## 2011-05-18 NOTE — Telephone Encounter (Signed)
Pacer Check,LOV faxed to [facility] @ [phone]  05/18/11/km

## 2011-05-24 LAB — CROSSMATCH
ABO/RH(D): O POS
Antibody Screen: NEGATIVE

## 2011-05-25 ENCOUNTER — Inpatient Hospital Stay (HOSPITAL_COMMUNITY)
Admission: RE | Admit: 2011-05-25 | Discharge: 2011-05-29 | DRG: 470 | Disposition: A | Discharge status: Home Health | Payer: Medicare Other | Source: Ambulatory Visit | Attending: Orthopedic Surgery | Admitting: Orthopedic Surgery

## 2011-05-25 DIAGNOSIS — F3289 Other specified depressive episodes: Secondary | ICD-10-CM | POA: Diagnosis present

## 2011-05-25 DIAGNOSIS — E119 Type 2 diabetes mellitus without complications: Secondary | ICD-10-CM | POA: Diagnosis present

## 2011-05-25 DIAGNOSIS — Z950 Presence of cardiac pacemaker: Secondary | ICD-10-CM

## 2011-05-25 DIAGNOSIS — I10 Essential (primary) hypertension: Secondary | ICD-10-CM | POA: Diagnosis present

## 2011-05-25 DIAGNOSIS — F329 Major depressive disorder, single episode, unspecified: Secondary | ICD-10-CM | POA: Diagnosis present

## 2011-05-25 DIAGNOSIS — Z01812 Encounter for preprocedural laboratory examination: Secondary | ICD-10-CM

## 2011-05-25 DIAGNOSIS — E7800 Pure hypercholesterolemia, unspecified: Secondary | ICD-10-CM | POA: Diagnosis present

## 2011-05-25 DIAGNOSIS — E669 Obesity, unspecified: Secondary | ICD-10-CM | POA: Diagnosis present

## 2011-05-25 DIAGNOSIS — M1710 Unilateral primary osteoarthritis, unspecified knee: Principal | ICD-10-CM | POA: Diagnosis present

## 2011-05-25 DIAGNOSIS — E876 Hypokalemia: Secondary | ICD-10-CM | POA: Diagnosis not present

## 2011-05-25 DIAGNOSIS — D62 Acute posthemorrhagic anemia: Secondary | ICD-10-CM | POA: Diagnosis not present

## 2011-05-25 DIAGNOSIS — F17200 Nicotine dependence, unspecified, uncomplicated: Secondary | ICD-10-CM | POA: Diagnosis present

## 2011-05-25 LAB — GLUCOSE, CAPILLARY: Glucose-Capillary: 112 mg/dL — ABNORMAL HIGH (ref 70–99)

## 2011-05-26 LAB — BASIC METABOLIC PANEL
BUN: 11 mg/dL (ref 6–23)
CO2: 24 mEq/L (ref 19–32)
Calcium: 8.8 mg/dL (ref 8.4–10.5)
Chloride: 104 mEq/L (ref 96–112)
GFR calc Af Amer: 80 mL/min — ABNORMAL LOW (ref >90)
GFR calc non Af Amer: 69 mL/min — ABNORMAL LOW (ref >90)
Glucose, Bld: 108 mg/dL — ABNORMAL HIGH (ref 70–99)
Potassium: 4.1 mEq/L (ref 3.5–5.1)
Sodium: 138 mEq/L (ref 135–145)

## 2011-05-26 LAB — GLUCOSE, CAPILLARY
Glucose-Capillary: 93 mg/dL (ref 70–99)
Glucose-Capillary: 99 mg/dL (ref 70–99)
Glucose-Capillary: 115 mg/dL — ABNORMAL HIGH (ref 70–99)
Glucose-Capillary: 174 mg/dL — ABNORMAL HIGH (ref 70–99)

## 2011-05-26 LAB — CBC
HCT: 32.1 % — ABNORMAL LOW (ref 36.0–46.0)
Hemoglobin: 10.6 g/dL — ABNORMAL LOW (ref 12.0–15.0)
MCH: 29 pg (ref 26.0–34.0)
MCV: 87.9 fL (ref 78.0–100.0)
Platelets: 191 10*3/uL (ref 150–400)
RBC: 3.65 MIL/uL — ABNORMAL LOW (ref 3.87–5.11)
RDW: 14.3 % (ref 11.5–15.5)
WBC: 5.9 10*3/uL (ref 4.0–10.5)

## 2011-05-26 LAB — HEMOGLOBIN A1C
Hgb A1c MFr Bld: 6.5 % — ABNORMAL HIGH (ref <5.7)
Mean Plasma Glucose: 140 mg/dL — ABNORMAL HIGH (ref <117)

## 2011-05-27 LAB — GLUCOSE, CAPILLARY

## 2011-05-27 LAB — BASIC METABOLIC PANEL
BUN: 8 mg/dL (ref 6–23)
CO2: 25 mEq/L (ref 19–32)
Calcium: 8.3 mg/dL — ABNORMAL LOW (ref 8.4–10.5)
Creatinine, Ser: 0.96 mg/dL (ref 0.50–1.10)
Glucose, Bld: 152 mg/dL — ABNORMAL HIGH (ref 70–99)

## 2011-05-27 LAB — CBC
HCT: 27.3 % — ABNORMAL LOW (ref 36.0–46.0)
Hemoglobin: 9 g/dL — ABNORMAL LOW (ref 12.0–15.0)
MCH: 28.7 pg (ref 26.0–34.0)
MCV: 86.9 fL (ref 78.0–100.0)
Platelets: 173 10*3/uL (ref 150–400)
RBC: 3.14 MIL/uL — ABNORMAL LOW (ref 3.87–5.11)

## 2011-05-28 LAB — CBC
HCT: 24 % — ABNORMAL LOW (ref 36.0–46.0)
Hemoglobin: 7.9 g/dL — ABNORMAL LOW (ref 12.0–15.0)
WBC: 6.8 10*3/uL (ref 4.0–10.5)

## 2011-05-28 LAB — BASIC METABOLIC PANEL
Chloride: 106 mEq/L (ref 96–112)
GFR calc Af Amer: 67 mL/min — ABNORMAL LOW (ref >90)
GFR calc non Af Amer: 58 mL/min — ABNORMAL LOW (ref >90)
Potassium: 3.6 mEq/L (ref 3.5–5.1)
Sodium: 136 mEq/L (ref 135–145)

## 2011-05-28 LAB — GLUCOSE, CAPILLARY
Glucose-Capillary: 96 mg/dL (ref 70–99)
Glucose-Capillary: 164 mg/dL — ABNORMAL HIGH (ref 70–99)
Glucose-Capillary: 107 mg/dL — ABNORMAL HIGH (ref 70–99)

## 2011-05-29 LAB — CBC
HCT: 24.8 % — ABNORMAL LOW (ref 36.0–46.0)
MCH: 29.1 pg (ref 26.0–34.0)
MCHC: 33.9 g/dL (ref 30.0–36.0)
RDW: 14.3 % (ref 11.5–15.5)

## 2011-05-29 LAB — BASIC METABOLIC PANEL
BUN: 13 mg/dL (ref 6–23)
Calcium: 8.9 mg/dL (ref 8.4–10.5)
GFR calc Af Amer: 52 mL/min — ABNORMAL LOW (ref >90)
GFR calc non Af Amer: 45 mL/min — ABNORMAL LOW (ref >90)
Glucose, Bld: 104 mg/dL — ABNORMAL HIGH (ref 70–99)
Potassium: 3.3 mEq/L — ABNORMAL LOW (ref 3.5–5.1)

## 2011-05-29 LAB — GLUCOSE, CAPILLARY: Glucose-Capillary: 140 mg/dL — ABNORMAL HIGH (ref 70–99)

## 2011-05-30 LAB — CROSSMATCH
Antibody Screen: NEGATIVE
Unit division: 0

## 2011-06-01 NOTE — Op Note (Signed)
NAMERYLAND, SMOOTS             ACCOUNT NO.:  [id number]  MEDICAL RECORD NO.:  [id number]  LOCATION:  1613                         FACILITY:  [facility]  PHYSICIAN:  Dezeray Puccio L. Rendall, M.D.  DATE OF BIRTH:  1945/10/30  DATE OF PROCEDURE:  05/25/2011 DATE OF DISCHARGE:                              OPERATIVE REPORT   PREOPERATIVE DIAGNOSIS:  Osteoarthritis, right knee with deformity.  SURGICAL PROCEDURE:  Right LCS total knee arthroplasty with computer navigation assistance.  POSTOPERATIVE DIAGNOSIS:  Osteoarthritis, right knee with deformity.  SURGEON:  Denyla Cortese L. Rendall, M.D.  ASSISTANT:  Legrand Pitts. Duffy, P.A.C. -  present and participating in entire procedure.  ANESTHESIA:  General with femoral nerve block.  PATHOLOGY:  The patient has a fixed varus flexed knee, that I saw at computer nav.  I have 8 degrees of fixed varus and approximately 12 degrees of fixed flexion.  Andrienne has had knee pain for 3 years.  She had been treated by a rheumatologist.  She has been rheumatoid in addition to OA for the last 3 years.  She has had all conservative measures.  She has bone against bone in both compartments of the knee, and x-ray showed sclerosis and spurs, worse at the medial joint line.  PROCEDURE IN DETAIL:  Under general anesthesia with femoral nerve block, the right leg was prepared with DuraPrep and draped as a sterile field. Sterile tourniquet was placed proximally on the thigh.  Legs wrapped out with the Esmarch and the tourniquet was used at 350 mm.  Midline incision was made.  The patella was everted.  Femur was sized as the standard.  Debridement was done in preparation for computer mapping at this point.  Schanz pins were placed through accessory punctures at the superior medial tibia and in the distal femur within the wound.  The arrays were set up.  The femoral head was then identified, medial and lateral malleoli.  Proximal tibia and distal femur were then  mapped within 1 mm of reproducible accuracy.  Then using the first tibial guide, proximal tibial resection was carried out.  At this point, bone was noted to be very soft.  It is necessary to do a considerable release along the medial and posterior side to get the leg into full extension, and to release the varus deformity.  After approximately 3 tries with this using the tensioner, we got the leg straight within 1 degree of anatomic alignment.  The flexion gap was then measured using the first femoral guide.  Anterior and posterior flare of the distal femur were resected.  This was done with a 10 mm flexion gap.  Distal femoral cut was then made and remade to give an appropriate 10 mm extension gap. Once this was completed, remnants of the menisci and cruciates were removed.  Spurs were taken off the back of the femoral condyles.  The recessing guide is then used.  Following this, the proximal tibia was sized at a 2.5, center peg hole with keel are inserted, a 10 bearing was inserted, and the standard femur.  The knee has excellent fit, alignment, and stability.  The patella was then osteotomized, 3 peg holes inserted.  Permanent components were  obtained while bony surfaces were prepared with pulse irrigation, and then all components were cemented in place.  Alignment prior to insertion of permanent components was within 1 degree of anatomic, with full extension.  At this point with permanent components in place, the tourniquet was let down once the cement was hardened.  Multiple small vessels were cauterized. Medium Hemovac drain was inserted.  The knee was then closed in layers with #1 Tycron, #1 Vicryl, 2-0 Vicryl, and skin clips.  Operative time approximately an hour and 25 minutes.  The patient tolerated the procedure well and returned to recovery in good condition.     Hughes Wyndham L. Priscille Kluver, M.D.     Renato Gails  D:  05/25/2011  T:  05/25/2011  Job:  [id number]  cc:   Wyvonnia Lora,  MD Fax: [phone]  Aundra Dubin, M.D. [address] [place] [address]  Electronically Signed by Erasmo Leventhal M.D. on 06/01/2011 07:37:19 AM

## 2011-06-06 NOTE — Discharge Summary (Signed)
Tracey Koch, Tracey Koch             ACCOUNT NO.:  [id number]  MEDICAL RECORD NO.:  [id number]  LOCATION:  1613                         FACILITY:  [facility]  PHYSICIAN:  Elver Stadler L. Rendall, M.D.  DATE OF BIRTH:  12/24/1945  DATE OF ADMISSION:  05/25/2011 DATE OF DISCHARGE:  05/29/2011                              DISCHARGE SUMMARY   ADMISSION DIAGNOSES: 1. Rheumatoid arthritis, right knee. 2. Diabetes mellitus. 3. Possible chronic obstructive pulmonary disease. 4. Hypertension. 5. Obesity. 6. Pacemaker secondary to sick sinus syndrome. 7. Hypercholesterolemia. 8. Depression.  DISCHARGE DIAGNOSES: 1. Rheumatoid arthritis of right knee, status post right total knee     arthroplasty. 2. Acute blood loss anemia secondary to surgery. 3. Hypokalemia. 4. Diabetes mellitus. 5. Possible chronic obstructive pulmonary disease. 6. Hypertension. 7. Obesity. 8. Pacemaker secondary to sick sinus syndrome. 9. Hypercholesterolemia. 10.Depression.  SURGICAL PROCEDURE:  On May 25, 2011, Tracey Koch underwent a right total knee arthroplasty with computer navigation by Dr. Jonny Ruiz L. Randall assisted by Arnoldo Morale PAC.  Tracey Koch had an LCS complete metal-backed patella, cemented size standard placed with a tibial tray rotating platform, MBT keel size 2-1/2, cemented.  A primary femoral component, cemented size standard right and an LCS complete rotating platform, 10 mm thickness standard.  COMPLICATIONS:  None.  CONSULT: 1. Physical therapy consult on May 26, 2011 2. Occupational therapy consult on May 27, 2011.  HISTORY OF PRESENT ILLNESS:  This 65 year old white female patient with rheumatoid arthritis presented with a 3-year history of gradual onset progressive right knee pain.  Tracey Koch has had no injury or surgery to the knee, but the pain is a constant ache, diffuse about the knee without radiation and increases with prolonged walking and decreases with lying down.  The knee pops  swells, occasionally gives way.  Ultram provides no relief of pain.  Tracey Koch has failed cortisone injections, also requires a cane walker or scooter for ambulation.  X-rays show bone-on-bone in the medial compartment of the knee with significant spurs off the tibia and femur.  Because of this, Tracey Koch is presenting for right knee replacement.  HOSPITAL COURSE:  Tracey Koch tolerated her surgical procedure well without immediate postoperative complications.  Tracey Koch was transferred to the orthopedic floor.  On postop day #1, Tracey Koch was afebrile.  Vitals were stable.  Hemoglobin 10.6, hematocrit 32.1.  Tracey Koch was tolerating CPM.  Tracey Koch was weaned off her oxygen and started on therapy per protocol.  On postop day #2, hemoglobin had dropped to 9 with hematocrit of 27.3. Potassium was low at 3.1 that was orally supplemented.  Tracey Koch was making slow progress with therapy, so discussion was had about possible need for skilled facility and that was discussed with the family.  Tracey Koch continued to make slow steady progress over the next 2 days.  On the29th, Tracey Koch was doing well enough with therapy that the family felt they could take her home.  Her hemoglobin, however, had dropped to 8.4 with hematocrit of 24.8.  With her rheumatoid arthritis, we felt it was necessary to give her 1 unit of blood, so Tracey Koch was transfused at that time.  Tracey Koch remained afebrile.  Potassium was still a bit low at 3.3, and that  was continued to be supplemented.  After her transfusion that day, Tracey Koch was doing well enough with therapy that Tracey Koch was able to be discharged to home.  DISCHARGE INSTRUCTIONS: 1. Diet:  Tracey Koch is to resume her regular prehospitalization diet. 2. Medications:  Please see the patient med discharge instruction     sheet for complete documentation of medications.  We did add     ferrous sulfate, Robaxin, Percocet, and Xarelto to her medicine     regime.  Tracey Koch is to hold her aspirin and her tramadol at this time.     Tracey Koch can resume  the aspirin, once his Xarelto was complete and Tracey Koch     is to resume her methotrexate the next week after surgery. 3. Activity:  Tracey Koch is to be weightbearing as tolerated on the right leg     with use of a walker.  No lifting or driving for 6 weeks.  Please     see the white total joint discharge sheet for further activity     instructions. 4. Wound care:  Please see the white total joint discharge sheet for     further wound care instructions. 5. Followup:  Tracey Koch is to follow up with Dr. Brynda Greathouse in our office in     [place].  Tracey Koch needs to call [phone] for that appointment.  Tracey Koch needs     to follow up on Monday, June 05, 2011.  Tracey Koch is arranged for home     health PT per [place].  LABORATORY DATA:  Hemoglobin and hematocrit were ranged from 10.6 and 32.1 on 26th; to 7.9 and 24 on the 28th; to 8.4 and 24.8 on the 29th. White count and platelets were within normal limits.  Potassium ranged from 4.1 on 26th; to 3.1 on the 27th; to 3.6 on the 28th; 3.3 on the 29th.  Glucose ranged from 108 on the 26th; to 152 on the 27th; then to 104.  BUN and creatinine went to a high of 1.22 and 13 on the 29th.  Calcium ranged from 8.8 on 26th to 8.3 on the 27th and 8.9 on the 29th.  Hemoglobin A1c was 6.5% on the 26th.  All other laboratory studies were within normal limits.     Legrand Pitts Duffy, P.A.   ______________________________ Carlisle Beers. Priscille Kluver, M.D.    KED/MEDQ  D:  06/01/2011  T:  06/01/2011  Job:  [id number]  Electronically Signed by Otilio Jefferson. on 06/01/2011 12:45:54 PM Electronically Signed by Erasmo Leventhal M.D. on 06/06/2011 01:31:49 PM

## 2011-07-10 ENCOUNTER — Ambulatory Visit (INDEPENDENT_AMBULATORY_CARE_PROVIDER_SITE_OTHER): Payer: Medicare Other | Admitting: Internal Medicine

## 2011-07-10 ENCOUNTER — Encounter: Payer: Self-pay | Admitting: Internal Medicine

## 2011-07-10 VITALS — BP 115/70 | HR 75 | Resp 16 | Ht 64.0 in

## 2011-07-10 DIAGNOSIS — Z950 Presence of cardiac pacemaker: Secondary | ICD-10-CM

## 2011-07-10 DIAGNOSIS — M069 Rheumatoid arthritis, unspecified: Secondary | ICD-10-CM

## 2011-07-10 DIAGNOSIS — I10 Essential (primary) hypertension: Secondary | ICD-10-CM

## 2011-07-10 DIAGNOSIS — I495 Sick sinus syndrome: Secondary | ICD-10-CM | POA: Insufficient documentation

## 2011-07-10 LAB — PACEMAKER DEVICE OBSERVATION
AL IMPEDENCE PM: 596 Ohm
ATRIAL PACING PM: 11
BATTERY VOLTAGE: 2.76 V
RV LEAD IMPEDENCE PM: 736 Ohm
VENTRICULAR PACING PM: 0

## 2011-07-10 NOTE — Progress Notes (Signed)
HPI Ms. Tracey Koch returns today for followup. She is a pleasant 65 yo woman who has a h/o symptomatic bradycardia, s/p PPM, HTN, arthritis and ongoing tobacco abuse. She is s/p knee replacement surgery and continues to c/o knee pain. She is undergoing rehab. She denies c/p or sob but admits to being sedentary. No syncope. No palpitations. Allergies  Allergen Reactions  . Codeine   . Penicillins   . Sulfa Drugs Cross Reactors      Current Outpatient Prescriptions  Medication Sig Dispense Refill  . folic acid (FOLVITE) 1 MG tablet Take 1 mg by mouth daily.        . furosemide (LASIX) 40 MG tablet Take 40 mg by mouth daily.        . hydroxychloroquine (PLAQUENIL) 200 MG tablet Take 400 mg by mouth daily.        . methotrexate (RHEUMATREX) 7.5 MG tablet Take 7.5 mg by mouth 2 (two) times a week. Caution" Chemotherapy. Protect from light.       . simvastatin (ZOCOR) 40 MG tablet Take 40 mg by mouth at bedtime.        Marland Kitchen spironolactone (ALDACTONE) 25 MG tablet Take 25 mg by mouth 2 (two) times daily.        . traZODone (DESYREL) 150 MG tablet Take 150 mg by mouth at bedtime.           Past Medical History  Diagnosis Date  . Sinoatrial node dysfunction     ROS:   All systems reviewed and negative except as noted in the HPI.   No past surgical history on file.   No family history on file.   History   Social History  . Marital Status: Married    Spouse Name: N/A    Number of Children: N/A  . Years of Education: N/A   Occupational History  . Not on file.   Social History Main Topics  . Smoking status: Current Everyday Smoker  . Smokeless tobacco: Not on file  . Alcohol Use: Not on file  . Drug Use: Not on file  . Sexually Active: Not on file   Other Topics Concern  . Not on file   Social History Narrative  . No narrative on file     BP 115/70  Pulse 75  Resp 16  Ht 5\' 4"  (1.626 m)  Physical Exam:  Well appearing middle aged woman, looks older than her stated  age, but in NAD HEENT: Unremarkable Neck:  No JVD, no thyromegally Lymphatics:  No adenopathy Back:  No CVA tenderness Lungs:  Clear except for scattered expiratory crackles. No wheezes or ronchi. Well healed PPM incision. HEART:  Regular rate rhythm, no murmurs, no rubs, no clicks Abd:  soft, positive bowel sounds, no organomegally, no rebound, no guarding Ext:  2 plus pulses, right leg with swelling about the knee, no cyanosis, no clubbing Skin:  No rashes no nodules Neuro:  CN II through XII intact, motor grossly intact  DEVICE  Normal device function.  See PaceArt for details.   Assess/Plan:

## 2011-07-10 NOTE — Assessment & Plan Note (Signed)
Her device is working normally. Will recheck in several months. 

## 2011-07-10 NOTE — Patient Instructions (Signed)
Your physician recommends that you schedule a follow-up appointment in: 12 months.  

## 2011-07-10 NOTE — Assessment & Plan Note (Signed)
This appears to be her biggest problem.  She is s/p knee replacement and has had a slow rehab. I have encouraged her to increase her physical activity as tolerated.

## 2011-07-10 NOTE — Assessment & Plan Note (Signed)
Her blood pressure is well controlled. She will continue her current meds and maintain a low sodium diet.

## 2011-10-12 ENCOUNTER — Encounter: Payer: Self-pay | Admitting: Internal Medicine

## 2011-10-12 ENCOUNTER — Ambulatory Visit (INDEPENDENT_AMBULATORY_CARE_PROVIDER_SITE_OTHER): Payer: Medicare Other | Admitting: *Deleted

## 2011-10-12 DIAGNOSIS — I495 Sick sinus syndrome: Secondary | ICD-10-CM

## 2011-10-13 LAB — REMOTE PACEMAKER DEVICE
AL AMPLITUDE: 2.8 mv
AL THRESHOLD: 0.5 V
BAMS-0001: 175 {beats}/min
BATTERY VOLTAGE: 2.75 V
RV LEAD AMPLITUDE: 16 mv

## 2011-10-26 NOTE — Progress Notes (Signed)
Remote pacer check  

## 2011-10-30 ENCOUNTER — Encounter: Payer: Self-pay | Admitting: *Deleted

## 2012-01-18 ENCOUNTER — Encounter: Payer: Medicare Other | Admitting: *Deleted

## 2012-01-29 ENCOUNTER — Encounter: Payer: Self-pay | Admitting: *Deleted

## 2012-02-06 ENCOUNTER — Ambulatory Visit (INDEPENDENT_AMBULATORY_CARE_PROVIDER_SITE_OTHER): Payer: Medicare Other | Admitting: *Deleted

## 2012-02-06 ENCOUNTER — Encounter: Payer: Self-pay | Admitting: Internal Medicine

## 2012-02-06 DIAGNOSIS — I495 Sick sinus syndrome: Secondary | ICD-10-CM

## 2012-02-16 LAB — REMOTE PACEMAKER DEVICE
AL AMPLITUDE: 2.8 mv
ATRIAL PACING PM: 7
BAMS-0001: 175 {beats}/min
BATTERY VOLTAGE: 2.75 V
RV LEAD IMPEDENCE PM: 784 Ohm
RV LEAD THRESHOLD: 0.625 V
VENTRICULAR PACING PM: 0

## 2012-03-12 ENCOUNTER — Encounter: Payer: Self-pay | Admitting: *Deleted

## 2012-05-20 ENCOUNTER — Encounter: Payer: Medicare Other | Admitting: *Deleted

## 2012-05-24 ENCOUNTER — Encounter: Payer: Self-pay | Admitting: *Deleted

## 2012-05-29 ENCOUNTER — Ambulatory Visit (INDEPENDENT_AMBULATORY_CARE_PROVIDER_SITE_OTHER): Payer: Medicare Other | Admitting: *Deleted

## 2012-05-29 DIAGNOSIS — Z950 Presence of cardiac pacemaker: Secondary | ICD-10-CM

## 2012-05-29 DIAGNOSIS — I495 Sick sinus syndrome: Secondary | ICD-10-CM

## 2012-06-11 LAB — REMOTE PACEMAKER DEVICE
AL IMPEDENCE PM: 552 Ohm
AL THRESHOLD: 0.5 V
RV LEAD AMPLITUDE: 22.4 mv
RV LEAD IMPEDENCE PM: 678 Ohm
RV LEAD THRESHOLD: 0.75 V

## 2012-06-18 ENCOUNTER — Encounter: Payer: Self-pay | Admitting: *Deleted

## 2012-07-01 ENCOUNTER — Encounter: Payer: Self-pay | Admitting: Internal Medicine

## 2012-08-05 ENCOUNTER — Encounter: Payer: Self-pay | Admitting: Internal Medicine

## 2012-08-05 ENCOUNTER — Ambulatory Visit (INDEPENDENT_AMBULATORY_CARE_PROVIDER_SITE_OTHER): Payer: Medicare Other | Admitting: Internal Medicine

## 2012-08-05 VITALS — BP 115/66 | HR 82

## 2012-08-05 DIAGNOSIS — Z950 Presence of cardiac pacemaker: Secondary | ICD-10-CM

## 2012-08-05 DIAGNOSIS — I10 Essential (primary) hypertension: Secondary | ICD-10-CM

## 2012-08-05 DIAGNOSIS — I495 Sick sinus syndrome: Secondary | ICD-10-CM

## 2012-08-05 LAB — PACEMAKER DEVICE OBSERVATION
AL AMPLITUDE: 2.8 mv
AL IMPEDENCE PM: 496 Ohm
BATTERY VOLTAGE: 2.74 V
RV LEAD IMPEDENCE PM: 704 Ohm
RV LEAD THRESHOLD: 0.75 V
VENTRICULAR PACING PM: 0

## 2012-08-05 NOTE — Assessment & Plan Note (Signed)
Her blood pressure is well controlled. Will continue current meds and she will maintain a low sodium diet.

## 2012-08-05 NOTE — Progress Notes (Signed)
HPI Tracey Koch returns today for followup. She is a pleasant 67 yo woman with a h/o symptomatic bradycardia, s/p PPM, HTN, arthritis, and DM. In the interim, she has been stable. She is trying to stop smoking and is down to under a pack a day. No chest pain or sob. No syncope. She still has pain in her left knee. The right knee which was replaced is improved. Allergies  Allergen Reactions  . Codeine   . Penicillins   . Sulfa Drugs Cross Reactors      Current Outpatient Prescriptions  Medication Sig Dispense Refill  . metFORMIN (GLUCOPHAGE) 500 MG tablet 500 mg 2 (two) times daily with a meal.       . methotrexate (RHEUMATREX) 7.5 MG tablet Take 7.5 mg by mouth 2 (two) times a week. Caution" Chemotherapy. Protect from light.       Marland Kitchen PARoxetine (PAXIL) 20 MG tablet Take 20 mg by mouth at bedtime.      . simvastatin (ZOCOR) 40 MG tablet Take 40 mg by mouth at bedtime.        . traMADol (ULTRAM) 50 MG tablet Take 50 mg by mouth every 6 (six) hours as needed.      . traZODone (DESYREL) 150 MG tablet Take 150 mg by mouth at bedtime.        . furosemide (LASIX) 40 MG tablet Take 40 mg by mouth daily.        . hydroxychloroquine (PLAQUENIL) 200 MG tablet Take 400 mg by mouth daily.        . predniSONE (DELTASONE) 5 MG tablet as needed.       Marland Kitchen spironolactone (ALDACTONE) 25 MG tablet Take 25 mg by mouth 2 (two) times daily.           Past Medical History  Diagnosis Date  . Sinoatrial node dysfunction     ROS:   All systems reviewed and negative except as noted in the HPI.   No past surgical history on file.   No family history on file.   History   Social History  . Marital Status: Married    Spouse Name: N/A    Number of Children: N/A  . Years of Education: N/A   Occupational History  . Not on file.   Social History Main Topics  . Smoking status: Current Every Day Smoker  . Smokeless tobacco: Not on file  . Alcohol Use: Not on file  . Drug Use: Not on file  . Sexually  Active: Not on file   Other Topics Concern  . Not on file   Social History Narrative  . No narrative on file     There were no vitals taken for this visit.  Physical Exam:  Well appearing 67 yo woman, NAD HEENT: Unremarkable Neck:  7 cm JVD, no thyromegally Lungs:  Clear with no wheezes, rales, or rhonchi HEART:  Regular rate rhythm, no murmurs, no rubs, no clicks Abd:  soft, positive bowel sounds, no organomegally, no rebound, no guarding Ext:  2 plus pulses, no edema, no cyanosis, no clubbing Skin:  No rashes no nodules Neuro:  CN II through XII intact, motor grossly intact  DEVICE  Normal device function.  See PaceArt for details.   Assess/Plan:

## 2012-08-05 NOTE — Assessment & Plan Note (Signed)
Her Medtronic DDD PPM is working normally. Will recheck in several months.

## 2012-08-05 NOTE — Patient Instructions (Addendum)
Your physician recommends that you schedule a follow-up appointment in:  September with Dr Kennon Rounds transmission April 7 th

## 2012-08-12 ENCOUNTER — Encounter: Payer: Self-pay | Admitting: Internal Medicine

## 2012-11-04 ENCOUNTER — Encounter: Payer: Medicare Other | Admitting: *Deleted

## 2012-11-11 ENCOUNTER — Encounter: Payer: Self-pay | Admitting: *Deleted

## 2012-11-14 ENCOUNTER — Other Ambulatory Visit: Payer: Self-pay | Admitting: Internal Medicine

## 2012-11-14 ENCOUNTER — Ambulatory Visit (INDEPENDENT_AMBULATORY_CARE_PROVIDER_SITE_OTHER): Payer: Medicare Other | Admitting: *Deleted

## 2012-11-14 DIAGNOSIS — Z950 Presence of cardiac pacemaker: Secondary | ICD-10-CM

## 2012-11-14 DIAGNOSIS — I495 Sick sinus syndrome: Secondary | ICD-10-CM

## 2012-11-20 LAB — REMOTE PACEMAKER DEVICE
AL THRESHOLD: 0.5 V
ATRIAL PACING PM: 15
BAMS-0001: 175 {beats}/min
RV LEAD THRESHOLD: 0.75 V

## 2012-12-19 ENCOUNTER — Encounter: Payer: Self-pay | Admitting: *Deleted

## 2012-12-31 ENCOUNTER — Encounter: Payer: Self-pay | Admitting: Internal Medicine

## 2013-02-17 ENCOUNTER — Ambulatory Visit (INDEPENDENT_AMBULATORY_CARE_PROVIDER_SITE_OTHER): Payer: Medicare Other | Admitting: *Deleted

## 2013-02-17 DIAGNOSIS — Z950 Presence of cardiac pacemaker: Secondary | ICD-10-CM

## 2013-02-17 DIAGNOSIS — I495 Sick sinus syndrome: Secondary | ICD-10-CM

## 2013-02-19 LAB — REMOTE PACEMAKER DEVICE
AL AMPLITUDE: 2.8 mv
AL IMPEDENCE PM: 616 Ohm
BATTERY VOLTAGE: 2.73 V
RV LEAD AMPLITUDE: 22.4 mv
VENTRICULAR PACING PM: 0

## 2013-03-21 ENCOUNTER — Encounter: Payer: Self-pay | Admitting: *Deleted

## 2013-04-01 ENCOUNTER — Encounter: Payer: Self-pay | Admitting: Internal Medicine

## 2013-04-24 ENCOUNTER — Encounter (HOSPITAL_COMMUNITY): Payer: Self-pay | Admitting: Pharmacy Technician

## 2013-04-25 NOTE — Pre-Procedure Instructions (Signed)
Kealey Kemmer  04/25/2013   Your procedure is scheduled on:  Tues, Oct 7 @ 10:05 AM  Report to [facility]  2 * 3 at 8:00 AM.  Call this number if you have problems the morning of surgery: [phone]   Remember:   Do not eat food or drink liquids after midnight.                  Stop taking your Aspirin.No Goody's,BC's,Aleve,Ibuprofen,Fish Oil,or any Herbal Medications   Do not wear jewelry, make-up or nail polish.  Do not wear lotions, powders, or perfumes. You may wear deodorant.  Do not shave 48 hours prior to surgery.   Do not bring valuables to the hospital.  [facility] is not responsible                  for any belongings or valuables.               Contacts, dentures or bridgework may not be worn into surgery.  Leave suitcase in the car. After surgery it may be brought to your room.  For patients admitted to the hospital, discharge time is determined by your                treatment team.                 Special Instructions: Shower using CHG 2 nights before surgery and the night before surgery.  If you shower the day of surgery use CHG.  Use special wash - you have one bottle of CHG for all showers.  You should use approximately 1/3 of the bottle for each shower.   Please read over the following fact sheets that you were given: Pain Booklet, Coughing and Deep Breathing, Blood Transfusion Information, MRSA Information and Surgical Site Infection Prevention

## 2013-04-28 ENCOUNTER — Inpatient Hospital Stay (HOSPITAL_COMMUNITY)
Admission: RE | Admit: 2013-04-28 | Discharge: 2013-04-28 | Disposition: A | Discharge status: Home / Self Care | Payer: Medicare Other | Source: Ambulatory Visit

## 2013-04-28 ENCOUNTER — Encounter (HOSPITAL_COMMUNITY): Payer: Self-pay

## 2013-04-28 HISTORY — DX: Depression, unspecified: F32.A

## 2013-04-28 HISTORY — DX: Major depressive disorder, single episode, unspecified: F32.9

## 2013-04-28 HISTORY — DX: Other seasonal allergic rhinitis: J30.2

## 2013-04-28 HISTORY — DX: Type 2 diabetes mellitus without complications: E11.9

## 2013-04-28 HISTORY — DX: Hyperlipidemia, unspecified: E78.5

## 2013-04-28 MED ORDER — DARBEPOETIN ALFA-POLYSORBATE 25 MCG/0.42ML IJ SOLN
INTRAMUSCULAR | Status: AC
  Filled 2013-04-28: qty 0.42

## 2013-05-01 ENCOUNTER — Encounter (HOSPITAL_COMMUNITY): Payer: Self-pay

## 2013-05-01 ENCOUNTER — Encounter (HOSPITAL_COMMUNITY)
Admission: RE | Admit: 2013-05-01 | Discharge: 2013-05-01 | Disposition: A | Discharge status: Home / Self Care | Payer: Medicare Other | Source: Ambulatory Visit | Attending: Orthopedic Surgery | Admitting: Orthopedic Surgery

## 2013-05-01 ENCOUNTER — Encounter (HOSPITAL_COMMUNITY)
Admission: RE | Admit: 2013-05-01 | Discharge: 2013-05-01 | Disposition: A | Discharge status: Home / Self Care | Payer: Medicare Other | Source: Ambulatory Visit | Attending: Orthopaedic Surgery | Admitting: Orthopaedic Surgery

## 2013-05-01 DIAGNOSIS — Z01812 Encounter for preprocedural laboratory examination: Secondary | ICD-10-CM | POA: Insufficient documentation

## 2013-05-01 DIAGNOSIS — Z01818 Encounter for other preprocedural examination: Secondary | ICD-10-CM | POA: Insufficient documentation

## 2013-05-01 HISTORY — DX: Family history of other specified conditions: Z84.89

## 2013-05-01 HISTORY — DX: Anemia, unspecified: D64.9

## 2013-05-01 HISTORY — DX: Personal history of other medical treatment: Z92.89

## 2013-05-01 HISTORY — DX: Cough: R05

## 2013-05-01 HISTORY — DX: Personal history of urinary (tract) infections: Z87.440

## 2013-05-01 HISTORY — DX: Cough, unspecified: R05.9

## 2013-05-01 HISTORY — DX: Unspecified osteoarthritis, unspecified site: M19.90

## 2013-05-01 HISTORY — DX: Pain in unspecified joint: M25.50

## 2013-05-01 HISTORY — DX: Presence of cardiac pacemaker: Z95.0

## 2013-05-01 HISTORY — DX: Effusion, unspecified joint: M25.40

## 2013-05-01 LAB — URINALYSIS, ROUTINE W REFLEX MICROSCOPIC

## 2013-05-01 MED ORDER — CHLORHEXIDINE GLUCONATE 4 % EX LIQD: 60.0000 mL | Freq: Once | CUTANEOUS | Status: DC

## 2013-05-01 MED ORDER — CHLORHEXIDINE GLUCONATE 4 % EX LIQD: 60.0000 mL | Freq: Every day | CUTANEOUS | Status: DC

## 2013-05-01 NOTE — Progress Notes (Signed)
Cardiologist is Dr.Joyce with [facility]-last visit in Jan 2014 and sees him yearly  Heart cath report in epic from 2006  Stress test and echo done 8-53yrs ago  Medical Md is Dr.David Tapper  EKG in epic from 08-12-12  Denies CXR in past yr

## 2013-05-01 NOTE — H&P (Signed)
CHIEF COMPLAINT:  Painful left knee.    HISTORY OF PRESENT ILLNESS:  Tracey Koch is a 67 year old white female who is seen today for evaluation of her left knee.  She has been having pain and discomfort in this left knee for several months.  She was last evaluated back in May of 2014 and at that time she was having some problems with a urinary tract infection and was sent to a specialist.  She is on methotrexate by Dr. Kellie Simmering.  She unfortunately has come to the point where she is losing motion in her knee and she has pain with every step.  She is unable to fully straighten the knee or fully bend the knee either.  Her bad days outnumber her good days.  She walks with a walker at this time.  Her pain is progressive.  She has had several injections into the left knee by Dr. Kellie Simmering as well as Dr. Cleophas Dunker.  She denies any neurovascular compromise.  Seen today for evaluation.     PAST MEDICAL/SURGICAL HISTORY:  In general, health is fair.  Hospitalizations:  Total abdominal hysterectomy, bilateral salpingo-oophorectomy and appendectomy, pacemaker, laparoscopic cholecystectomy, right total knee replacement by Dr. Priscille Kluver.  She has had 3 children.    CURRENT MEDICATIONS:   1.  Metformin 500 mg daily 1 daily. 2.  Methotrexate 2.5 mg 5 tablets Thursday and Friday. 3.  Simvastatin 20 mg at bedtime. 4.  Tramadol 50 mg 2 tabs at bedtime. 5.  VESIcare 5 mg each morning.    ALLERGIES:  All codeines, sulfa, and penicillin, where she has swelling and rash.   FAMILY HISTORY:  Positive for mother who is deceased at age 30 from emphysema but was not a smoker.  Father is deceased at age 6 from a stroke or aneurysm.  Brothers, she has 4 of them still alive, ages 66, 19, 42, 71.  They have had diabetes and arthritis.  She has 1 sister who is deceased in her 97s from multiple sclerosis, 1 who remains alive at age 29 with diabetes.     SOCIAL HISTORY:  Tracey Koch is a 67 year old white female who is married.  She smokes  1/2 pack of cigarettes per day now, previously 1 pack per day and she smoked for 50+ years.  She denies use of alcohol.     REVIEW OF SYSTEMS:  Fourteen-point review of systems negative except for upper denture and lower partial.  She does also wear glasses.  She does have a morning cough either on the basis of allergies or she continues to be a cigarette smoker.  She has been a diabetic since age 60 but is only using 1 metformin 500 mg daily.  She has had a bladder infection about 1-1/2 months ago.  Nocturia 2-3 times per evening.  She has problems with bladder control.  She is on VESIcare.     PHYSICAL EXAMINATION:  A 67 year old female, well developed, well nourished, alert, cooperative, looks older than her stated age, in moderate distress secondary to left knee pain.  Her height is 5 feet 3 inches.  She weighs 194 pounds.  Her BMI is 34.4.   Vital signs:  A temperature of 98.4.  Pulse is 80.  Respirations 18.  Blood pressure 144/83.   Head:  Normocephalic.   Eyes:  Pupils equal, round and reactive to light and accomodation with extraocular movements intact. Ears, nose, throat:  Benign. Neck:  Supple.  No bruits were noted.   Chest:  Had decreased expansion.  Lungs:  Had decreased breath sounds diffusely with an occasional scattered wheeze. Cardiac:  Reveals a regular rhythm and rate.  Distant heart sounds.  No murmurs, rubs or gallops appreciated. Pulse:  Were 1+ bilateral and symmetric in the posterior tibialis bilaterally and symmetric.   Abdomen:  Obese, soft, nontender.  No masses palpable.  Normal bowel sounds present. Genitorectal/breast:  Exam not indicated for an orthopedic evaluation.  CNS:  She is oriented x3.  Cranial nerves II-XII grossly intact. Musculoskeletal:  She has a range of motion from about 20-25 degrees of flexion contracture to right now around 70 degrees of flexion.  Crepitance with range of motion.  Ligamentously, it is very difficult to determine because of her  flexion contractures.  She does open some with varus and valgus stressing.     RADIOGRAPHS:  Reveal significant OA of the left knee.  She is near bone-on-bone both medially and laterally.  Periarticular spurring is noted.  There appears to be a possible bone island noted or an old bone infarct in the distal femur.  Multiple cystic changes throughout both medial and lateral compartments.     CLINICAL IMPRESSION:   1.  Endstage OA, left knee. 2.  Cigarette smoker. 3.  Obesity. 4.  Diabetes.  5.  Rheumatologic disease on methotrexate.  6.  History of multiple UTIs.    RECOMMENDATIONS:   1.  At this time, I have reviewed clearance forms from Dr. Kellie Simmering who states she can stop her methotrexate the week of surgery and can restart the next week or be off a total of 2 weeks.  Will severely flare with her rheumatoid arthritis if she is off for more than 3 weeks.  She did also have a clearance noted from Dr. Margo Common, who felt that she was a candidate from medical and cardiac standpoint.   2.  At this time, we will proceed with planning for left total knee arthroplasty.  The procedure risks and benefits were fully explained to her in detail and she is understanding.  The complications were reiterated and was told that these were the majority but did not include others that were possible.  She is going to stop cigarette smoking as of today, which is 6 days prior to her total joint replacement.  Also she will begin an exercise program, straight leg lifts, to try to institute some strengthening.  All questions were answered for her and her daughter.    Oris Drone Aleda Grana [facility] [phone]  05/01/2013 2:36 PM

## 2013-05-02 LAB — SURGICAL PCR SCREEN
MRSA, PCR: NEGATIVE
Staphylococcus aureus: NEGATIVE

## 2013-05-02 LAB — TYPE AND SCREEN
ABO/RH(D): O POS
Antibody Screen: NEGATIVE

## 2013-05-02 LAB — ABO/RH: ABO/RH(D): O POS

## 2013-05-02 NOTE — H&P (Signed)
CHIEF COMPLAINT:  Painful left knee.    HISTORY OF PRESENT ILLNESS:  Tracey Koch is a 67 year old white female who is seen today for evaluation of her left knee.  She has been having pain and discomfort in this left knee for several months.  She was last evaluated back in May of 2014 and at that time she was having some problems with a urinary tract infection and was sent to a specialist.  She is on methotrexate by Dr. Kellie Simmering.  She unfortunately has come to the point where she is losing motion in her knee and she has pain with every step.  She is unable to fully straighten the knee or fully bend the knee either.  Her bad days outnumber her good days.  She walks with a walker at this time.  Her pain is progressive.  She has had several injections into the left knee by Dr. Kellie Simmering as well as Dr. Cleophas Dunker.  She denies any neurovascular compromise.  Seen today for evaluation.     PAST MEDICAL/SURGICAL HISTORY:  In general, health is fair.  Hospitalizations:  Total abdominal hysterectomy, bilateral salpingo-oophorectomy and appendectomy, pacemaker, laparoscopic cholecystectomy, right total knee replacement by Dr. Priscille Kluver.  She has had 3 children.    CURRENT MEDICATIONS:   1.  Metformin 500 mg daily 1 daily. 2.  Methotrexate 2.5 mg 5 tablets Thursday and Friday. 3.  Simvastatin 20 mg at bedtime. 4.  Tramadol 50 mg 2 tabs at bedtime. 5.  VESIcare 5 mg each morning.    ALLERGIES:  All codeines, sulfa, and penicillin, where she has swelling and rash.   FAMILY HISTORY:  Positive for mother who is deceased at age 30 from emphysema but was not a smoker.  Father is deceased at age 70 from a stroke or aneurysm.  Brothers, she has 4 of them still alive, ages 1, 14, 60, 39.  They have had diabetes and arthritis.  She has 1 sister who is deceased in her 33s from multiple sclerosis, 1 who remains alive at age 69 with diabetes.     SOCIAL HISTORY:  Martina is a 67 year old white female who is married.  She smokes 1/2  pack of cigarettes per day now, previously 1 pack per day and she smoked for 50+ years.  She denies use of alcohol.     REVIEW OF SYSTEMS:  Fourteen-point review of systems negative except for upper denture and lower partial.  She does also wear glasses.  She does have a morning cough either on the basis of allergies or she continues to be a cigarette smoker.  She has been a diabetic since age 1 but is only using 1 metformin 500 mg daily.  She has had a bladder infection about 1-1/2 months ago.  Nocturia 2-3 times per evening.  She has problems with bladder control.  She is on VESIcare.     PHYSICAL EXAMINATION:  A 67 year old female, well developed, well nourished, alert, cooperative, looks older than her stated age, in moderate distress secondary to left knee pain.  Her height is 5 feet 3 inches.  She weighs 194 pounds.  Her BMI is 34.4.   Vital signs:  A temperature of 98.4.  Pulse is 80.  Respirations 18.  Blood pressure 144/83.   Head:  Normocephalic.   Eyes:  Pupils equal, round and reactive to light and accomodation with extraocular movements intact. Ears, nose, throat:  Benign. Neck:  Supple.  No bruits were noted.   Chest:  Had decreased expansion.  Lungs:  Had decreased breath sounds diffusely with an occasional scattered wheeze. Cardiac:  Reveals a regular rhythm and rate.  Distant heart sounds.  No murmurs, rubs or gallops appreciated. Pulse:  Were 1+ bilateral and symmetric in the posterior tibialis bilaterally and symmetric.   Abdomen:  Obese, soft, nontender.  No masses palpable.  Normal bowel sounds present. Genitorectal/breast:  Exam not indicated for an orthopedic evaluation.  CNS:  She is oriented x3.  Cranial nerves II-XII grossly intact. Musculoskeletal:  She has a range of motion from about 20-25 degrees of flexion contracture to right now around 70 degrees of flexion.  Crepitance with range of motion.  Ligamentously, it is very difficult to determine because of her flexion  contractures.  She does open some with varus and valgus stressing.     RADIOGRAPHS:  Reveal significant OA of the left knee.  She is near bone-on-bone both medially and laterally.  Periarticular spurring is noted.  There appears to be a possible bone island noted or an old bone infarct in the distal femur.  Multiple cystic changes throughout both medial and lateral compartments.     CLINICAL IMPRESSION:   1.  Endstage OA, left knee. 2.  Cigarette smoker. 3.  Obesity. 4.  Diabetes.  5.  Rheumatologic disease on methotrexate.  6.  History of multiple UTIs.    RECOMMENDATIONS:   1.  At this time, I have reviewed clearance forms from Dr. Kellie Simmering who states she can stop her methotrexate the week of surgery and can restart the next week or be off a total of 2 weeks.  Will severely flare with her rheumatoid arthritis if she is off for more than 3 weeks.  She did also have a clearance noted from Dr. Margo Common, who felt that she was a candidate from medical and cardiac standpoint.   2.  At this time, we will proceed with planning for left total knee arthroplasty.  The procedure risks and benefits were fully explained to her in detail and she is understanding.  The complications were reiterated and was told that these were the majority but did not include others that were possible.  She is going to stop cigarette smoking as of today, which is 6 days prior to her total joint replacement.  Also she will begin an exercise program, straight leg lifts, to try to institute some strengthening.  All questions were answered for her and her daughter.   Oris Drone Aleda Grana [facility] [phone]  05/02/2013 9:11 PM

## 2013-05-02 NOTE — Progress Notes (Signed)
Tracey Koch the PA at Dr. Hoy Register office wants the patient to go to [facility] to have blood drawn or arrive at 6am on Tuesday for surgery. Patient states she will [place] back when her husband gets home to let us know what she will do.

## 2013-05-02 NOTE — Progress Notes (Signed)
Patient's husband called to say he will take patient to [facility] on Monday 05/05/13 to have blood drawn. Lab request information faxed to [facility] main lab to [name]'s attention to make them aware of patient coming on Monday.

## 2013-05-02 NOTE — Progress Notes (Signed)
Patient's blood will need to be repeated DOS on 10/7. Patient's family stated they will not be coming back to [facility] before surgery date. Patient was pre-admitted and tube station did not send the blood in a timely fashion and the blood was too late to process when discovered. Patient made aware and will notify Dr. Hoy Register office of this occurrence.

## 2013-05-05 MED ORDER — VANCOMYCIN HCL IN DEXTROSE 1-5 GM/200ML-% IV SOLN
1000.0000 mg | INTRAVENOUS | Status: AC
  Administered 2013-05-06: 1000 mg via INTRAVENOUS
  Filled 2013-05-05: qty 200

## 2013-05-06 ENCOUNTER — Inpatient Hospital Stay (HOSPITAL_COMMUNITY)
Admission: RE | Admit: 2013-05-06 | Discharge: 2013-05-09 | DRG: 470 | Disposition: A | Discharge status: Skilled Nursing Facility | Payer: Medicare Other | Source: Ambulatory Visit | Attending: Orthopaedic Surgery | Admitting: Orthopaedic Surgery

## 2013-05-06 ENCOUNTER — Encounter (HOSPITAL_COMMUNITY): Payer: Self-pay | Admitting: *Deleted

## 2013-05-06 ENCOUNTER — Encounter (HOSPITAL_COMMUNITY)
Admission: RE | Disposition: A | Discharge status: Skilled Nursing Facility | Payer: Self-pay | Source: Ambulatory Visit | Attending: Orthopaedic Surgery

## 2013-05-06 ENCOUNTER — Inpatient Hospital Stay (HOSPITAL_COMMUNITY): Payer: Medicare Other | Admitting: Anesthesiology

## 2013-05-06 ENCOUNTER — Encounter (HOSPITAL_COMMUNITY): Payer: Self-pay | Admitting: Anesthesiology

## 2013-05-06 DIAGNOSIS — M1710 Unilateral primary osteoarthritis, unspecified knee: Principal | ICD-10-CM | POA: Diagnosis present

## 2013-05-06 DIAGNOSIS — Z823 Family history of stroke: Secondary | ICD-10-CM

## 2013-05-06 DIAGNOSIS — E785 Hyperlipidemia, unspecified: Secondary | ICD-10-CM | POA: Diagnosis present

## 2013-05-06 DIAGNOSIS — Z95810 Presence of automatic (implantable) cardiac defibrillator: Secondary | ICD-10-CM

## 2013-05-06 DIAGNOSIS — Z9089 Acquired absence of other organs: Secondary | ICD-10-CM

## 2013-05-06 DIAGNOSIS — Z7901 Long term (current) use of anticoagulants: Secondary | ICD-10-CM

## 2013-05-06 DIAGNOSIS — Z79899 Other long term (current) drug therapy: Secondary | ICD-10-CM

## 2013-05-06 DIAGNOSIS — Z6834 Body mass index (BMI) 34.0-34.9, adult: Secondary | ICD-10-CM

## 2013-05-06 DIAGNOSIS — Z888 Allergy status to other drugs, medicaments and biological substances status: Secondary | ICD-10-CM

## 2013-05-06 DIAGNOSIS — D649 Anemia, unspecified: Secondary | ICD-10-CM | POA: Diagnosis present

## 2013-05-06 DIAGNOSIS — R112 Nausea with vomiting, unspecified: Secondary | ICD-10-CM | POA: Diagnosis not present

## 2013-05-06 DIAGNOSIS — N390 Urinary tract infection, site not specified: Secondary | ICD-10-CM | POA: Diagnosis not present

## 2013-05-06 DIAGNOSIS — Z8261 Family history of arthritis: Secondary | ICD-10-CM

## 2013-05-06 DIAGNOSIS — Z833 Family history of diabetes mellitus: Secondary | ICD-10-CM

## 2013-05-06 DIAGNOSIS — E1142 Type 2 diabetes mellitus with diabetic polyneuropathy: Secondary | ICD-10-CM | POA: Diagnosis present

## 2013-05-06 DIAGNOSIS — Z882 Allergy status to sulfonamides status: Secondary | ICD-10-CM

## 2013-05-06 DIAGNOSIS — E669 Obesity, unspecified: Secondary | ICD-10-CM | POA: Diagnosis present

## 2013-05-06 DIAGNOSIS — M069 Rheumatoid arthritis, unspecified: Secondary | ICD-10-CM | POA: Diagnosis present

## 2013-05-06 DIAGNOSIS — Z880 Allergy status to penicillin: Secondary | ICD-10-CM

## 2013-05-06 DIAGNOSIS — Z836 Family history of other diseases of the respiratory system: Secondary | ICD-10-CM

## 2013-05-06 DIAGNOSIS — E1149 Type 2 diabetes mellitus with other diabetic neurological complication: Secondary | ICD-10-CM | POA: Diagnosis present

## 2013-05-06 DIAGNOSIS — F329 Major depressive disorder, single episode, unspecified: Secondary | ICD-10-CM | POA: Diagnosis present

## 2013-05-06 DIAGNOSIS — Z96659 Presence of unspecified artificial knee joint: Secondary | ICD-10-CM

## 2013-05-06 DIAGNOSIS — M1712 Unilateral primary osteoarthritis, left knee: Secondary | ICD-10-CM | POA: Diagnosis present

## 2013-05-06 DIAGNOSIS — A498 Other bacterial infections of unspecified site: Secondary | ICD-10-CM | POA: Diagnosis not present

## 2013-05-06 DIAGNOSIS — I495 Sick sinus syndrome: Secondary | ICD-10-CM | POA: Diagnosis present

## 2013-05-06 DIAGNOSIS — F3289 Other specified depressive episodes: Secondary | ICD-10-CM | POA: Diagnosis present

## 2013-05-06 DIAGNOSIS — F17200 Nicotine dependence, unspecified, uncomplicated: Secondary | ICD-10-CM | POA: Diagnosis present

## 2013-05-06 HISTORY — PX: TOTAL KNEE ARTHROPLASTY: SHX125

## 2013-05-06 LAB — URINALYSIS, ROUTINE W REFLEX MICROSCOPIC
Glucose, UA: NEGATIVE mg/dL
Ketones, ur: NEGATIVE mg/dL
Nitrite: POSITIVE — AB
Protein, ur: NEGATIVE mg/dL
Specific Gravity, Urine: 1.017 (ref 1.005–1.030)
pH: 6 (ref 5.0–8.0)

## 2013-05-06 LAB — GLUCOSE, CAPILLARY
Glucose-Capillary: 127 mg/dL — ABNORMAL HIGH (ref 70–99)
Glucose-Capillary: 144 mg/dL — ABNORMAL HIGH (ref 70–99)

## 2013-05-06 LAB — GRAM STAIN

## 2013-05-06 LAB — URINE MICROSCOPIC-ADD ON

## 2013-05-06 LAB — HEMOGLOBIN A1C
Hgb A1c MFr Bld: 5.7 % — ABNORMAL HIGH (ref <5.7)
Mean Plasma Glucose: 117 mg/dL — ABNORMAL HIGH (ref <117)

## 2013-05-06 SURGERY — ARTHROPLASTY, KNEE, TOTAL
Anesthesia: General | Site: Knee | Laterality: Left | Wound class: Clean

## 2013-05-06 MED ORDER — INSULIN ASPART 100 UNIT/ML ~~LOC~~ SOLN
0.0000 [IU] | Freq: Three times a day (TID) | SUBCUTANEOUS | Status: DC
  Administered 2013-05-06: 3 [IU] via SUBCUTANEOUS
  Administered 2013-05-07: 5 [IU] via SUBCUTANEOUS
  Administered 2013-05-07 – 2013-05-08 (×4): 3 [IU] via SUBCUTANEOUS
  Administered 2013-05-08: 8 [IU] via SUBCUTANEOUS
  Administered 2013-05-09: 3 [IU] via SUBCUTANEOUS
  Administered 2013-05-09: 5 [IU] via SUBCUTANEOUS

## 2013-05-06 MED ORDER — HYDROMORPHONE HCL PF 1 MG/ML IJ SOLN
0.5000 mg | INTRAMUSCULAR | Status: DC | PRN
  Administered 2013-05-06: 1 mg via INTRAVENOUS
  Filled 2013-05-06: qty 1

## 2013-05-06 MED ORDER — FOLIC ACID 1 MG PO TABS
1.0000 mg | ORAL_TABLET | Freq: Every day | ORAL | Status: DC
  Administered 2013-05-06 – 2013-05-09 (×4): 1 mg via ORAL
  Filled 2013-05-06 (×5): qty 1

## 2013-05-06 MED ORDER — KETOROLAC TROMETHAMINE 15 MG/ML IJ SOLN
15.0000 mg | Freq: Four times a day (QID) | INTRAMUSCULAR | Status: AC
  Administered 2013-05-06: 15 mg via INTRAVENOUS
  Filled 2013-05-06 (×3): qty 1

## 2013-05-06 MED ORDER — HYDROMORPHONE HCL PF 1 MG/ML IJ SOLN: 0.2500 mg | INTRAMUSCULAR | Status: DC | PRN

## 2013-05-06 MED ORDER — ALUM & MAG HYDROXIDE-SIMETH 200-200-20 MG/5ML PO SUSP: 30.0000 mL | ORAL | Status: DC | PRN

## 2013-05-06 MED ORDER — INFLUENZA VAC SPLIT QUAD 0.5 ML IM SUSP
0.5000 mL | INTRAMUSCULAR | Status: AC
  Filled 2013-05-06: qty 0.5

## 2013-05-06 MED ORDER — ONDANSETRON HCL 4 MG/2ML IJ SOLN
INTRAMUSCULAR | Status: DC | PRN
  Administered 2013-05-06: 4 mg via INTRAMUSCULAR

## 2013-05-06 MED ORDER — LORATADINE 10 MG PO TABS
10.0000 mg | ORAL_TABLET | Freq: Every day | ORAL | Status: DC
  Administered 2013-05-06 – 2013-05-09 (×4): 10 mg via ORAL
  Filled 2013-05-06 (×5): qty 1

## 2013-05-06 MED ORDER — METOCLOPRAMIDE HCL 5 MG/ML IJ SOLN
5.0000 mg | Freq: Three times a day (TID) | INTRAMUSCULAR | Status: DC | PRN
  Administered 2013-05-06: 10 mg via INTRAVENOUS
  Filled 2013-05-06: qty 2

## 2013-05-06 MED ORDER — MAGNESIUM HYDROXIDE 400 MG/5ML PO SUSP: 30.0000 mL | Freq: Every day | ORAL | Status: DC | PRN

## 2013-05-06 MED ORDER — ONDANSETRON HCL 4 MG/2ML IJ SOLN
4.0000 mg | Freq: Four times a day (QID) | INTRAMUSCULAR | Status: DC | PRN
  Administered 2013-05-07: 4 mg via INTRAVENOUS
  Filled 2013-05-06: qty 2

## 2013-05-06 MED ORDER — MIDAZOLAM HCL 2 MG/2ML IJ SOLN
1.0000 mg | INTRAMUSCULAR | Status: DC | PRN
  Administered 2013-05-06: 2 mg via INTRAVENOUS

## 2013-05-06 MED ORDER — ACETAMINOPHEN 650 MG RE SUPP: 650.0000 mg | Freq: Four times a day (QID) | RECTAL | Status: DC | PRN

## 2013-05-06 MED ORDER — LACTATED RINGERS IV SOLN
INTRAVENOUS | Status: DC
  Administered 2013-05-06: 10:00:00 via INTRAVENOUS

## 2013-05-06 MED ORDER — MEPERIDINE HCL 25 MG/ML IJ SOLN: 6.2500 mg | INTRAMUSCULAR | Status: DC | PRN

## 2013-05-06 MED ORDER — METHOCARBAMOL 500 MG PO TABS
500.0000 mg | ORAL_TABLET | Freq: Four times a day (QID) | ORAL | Status: DC | PRN
  Administered 2013-05-07 – 2013-05-09 (×5): 500 mg via ORAL
  Filled 2013-05-06 (×6): qty 1

## 2013-05-06 MED ORDER — FLUOXETINE HCL 20 MG PO TABS
20.0000 mg | ORAL_TABLET | Freq: Every day | ORAL | Status: DC
  Administered 2013-05-06 – 2013-05-08 (×3): 20 mg via ORAL
  Filled 2013-05-06 (×4): qty 1

## 2013-05-06 MED ORDER — SODIUM CHLORIDE 0.9 % IR SOLN
Status: DC | PRN
  Administered 2013-05-06: 3000 mL
  Administered 2013-05-06: 1000 mL

## 2013-05-06 MED ORDER — OXYCODONE HCL 5 MG PO TABS: 5.0000 mg | ORAL_TABLET | ORAL | Status: DC | PRN

## 2013-05-06 MED ORDER — VANCOMYCIN HCL IN DEXTROSE 1-5 GM/200ML-% IV SOLN
1000.0000 mg | Freq: Two times a day (BID) | INTRAVENOUS | Status: AC
  Administered 2013-05-06: 1000 mg via INTRAVENOUS
  Filled 2013-05-06: qty 200

## 2013-05-06 MED ORDER — FENTANYL CITRATE 0.05 MG/ML IJ SOLN
INTRAMUSCULAR | Status: AC
  Administered 2013-05-06: 100 ug via INTRAVENOUS
  Filled 2013-05-06: qty 2

## 2013-05-06 MED ORDER — SODIUM CHLORIDE 0.9 % IV SOLN
75.0000 mL/h | INTRAVENOUS | Status: DC
  Administered 2013-05-06: 75 mL/h via INTRAVENOUS

## 2013-05-06 MED ORDER — BISACODYL 10 MG RE SUPP: 10.0000 mg | Freq: Every day | RECTAL | Status: DC | PRN

## 2013-05-06 MED ORDER — SODIUM CHLORIDE 0.9 % IV SOLN: INTRAVENOUS | Status: DC

## 2013-05-06 MED ORDER — VANCOMYCIN HCL 1000 MG IV SOLR
INTRAVENOUS | Status: DC | PRN
  Administered 2013-05-06: 1000 mg

## 2013-05-06 MED ORDER — FLEET ENEMA 7-19 GM/118ML RE ENEM: 1.0000 | ENEMA | Freq: Once | RECTAL | Status: AC | PRN

## 2013-05-06 MED ORDER — METHOCARBAMOL 100 MG/ML IJ SOLN
500.0000 mg | Freq: Four times a day (QID) | INTRAVENOUS | Status: DC | PRN
  Filled 2013-05-06: qty 5

## 2013-05-06 MED ORDER — MIDAZOLAM HCL 2 MG/2ML IJ SOLN
INTRAMUSCULAR | Status: AC
  Administered 2013-05-06: 2 mg via INTRAVENOUS
  Filled 2013-05-06: qty 2

## 2013-05-06 MED ORDER — RIVAROXABAN 10 MG PO TABS
10.0000 mg | ORAL_TABLET | Freq: Every day | ORAL | Status: DC
  Administered 2013-05-07 – 2013-05-08 (×3): 10 mg via ORAL
  Filled 2013-05-06 (×4): qty 1

## 2013-05-06 MED ORDER — BUPIVACAINE-EPINEPHRINE PF 0.5-1:200000 % IJ SOLN
INTRAMUSCULAR | Status: DC | PRN
  Administered 2013-05-06: 30 mL

## 2013-05-06 MED ORDER — PHENOL 1.4 % MT LIQD: 1.0000 | OROMUCOSAL | Status: DC | PRN

## 2013-05-06 MED ORDER — PROPOFOL 10 MG/ML IV BOLUS
INTRAVENOUS | Status: DC | PRN
  Administered 2013-05-06: 140 mg via INTRAVENOUS

## 2013-05-06 MED ORDER — FENTANYL CITRATE 0.05 MG/ML IJ SOLN
INTRAMUSCULAR | Status: DC | PRN
  Administered 2013-05-06: 50 ug via INTRAVENOUS
  Administered 2013-05-06 (×2): 100 ug via INTRAVENOUS
  Administered 2013-05-06 (×3): 50 ug via INTRAVENOUS

## 2013-05-06 MED ORDER — ONDANSETRON HCL 4 MG PO TABS: 4.0000 mg | ORAL_TABLET | Freq: Four times a day (QID) | ORAL | Status: DC | PRN

## 2013-05-06 MED ORDER — FENTANYL CITRATE 0.05 MG/ML IJ SOLN
50.0000 ug | INTRAMUSCULAR | Status: DC | PRN
  Administered 2013-05-06: 100 ug via INTRAVENOUS

## 2013-05-06 MED ORDER — INSULIN ASPART 100 UNIT/ML ~~LOC~~ SOLN: 0.0000 [IU] | Freq: Every day | SUBCUTANEOUS | Status: DC

## 2013-05-06 MED ORDER — METOCLOPRAMIDE HCL 10 MG PO TABS: 5.0000 mg | ORAL_TABLET | Freq: Three times a day (TID) | ORAL | Status: DC | PRN

## 2013-05-06 MED ORDER — OXYCODONE HCL 5 MG/5ML PO SOLN: 5.0000 mg | Freq: Once | ORAL | Status: DC | PRN

## 2013-05-06 MED ORDER — ACETAMINOPHEN 500 MG PO TABS: 1000.0000 mg | ORAL_TABLET | Freq: Once | ORAL | Status: DC

## 2013-05-06 MED ORDER — CIPROFLOXACIN HCL 500 MG PO TABS
500.0000 mg | ORAL_TABLET | Freq: Two times a day (BID) | ORAL | Status: DC
  Administered 2013-05-06 – 2013-05-08 (×4): 500 mg via ORAL
  Filled 2013-05-06 (×6): qty 1

## 2013-05-06 MED ORDER — LIDOCAINE HCL (CARDIAC) 10 MG/ML IV SOLN
INTRAVENOUS | Status: DC | PRN
  Administered 2013-05-06: 100 mg via INTRAVENOUS

## 2013-05-06 MED ORDER — MENTHOL 3 MG MT LOZG: 1.0000 | LOZENGE | OROMUCOSAL | Status: DC | PRN

## 2013-05-06 MED ORDER — BUPIVACAINE-EPINEPHRINE PF 0.25-1:200000 % IJ SOLN
INTRAMUSCULAR | Status: AC
  Filled 2013-05-06: qty 30

## 2013-05-06 MED ORDER — ONDANSETRON HCL 4 MG/2ML IJ SOLN
INTRAMUSCULAR | Status: AC
  Filled 2013-05-06: qty 2

## 2013-05-06 MED ORDER — DOCUSATE SODIUM 100 MG PO CAPS
100.0000 mg | ORAL_CAPSULE | Freq: Two times a day (BID) | ORAL | Status: DC
  Administered 2013-05-06 – 2013-05-09 (×6): 100 mg via ORAL
  Filled 2013-05-06 (×7): qty 1

## 2013-05-06 MED ORDER — NICOTINE 21 MG/24HR TD PT24
21.0000 mg | MEDICATED_PATCH | Freq: Every day | TRANSDERMAL | Status: DC
  Administered 2013-05-06 – 2013-05-09 (×4): 21 mg via TRANSDERMAL
  Filled 2013-05-06 (×4): qty 1

## 2013-05-06 MED ORDER — KETOROLAC TROMETHAMINE 30 MG/ML IJ SOLN
INTRAMUSCULAR | Status: DC | PRN
  Administered 2013-05-06: 30 mg via INTRAVENOUS

## 2013-05-06 MED ORDER — ACETAMINOPHEN 325 MG PO TABS
650.0000 mg | ORAL_TABLET | Freq: Four times a day (QID) | ORAL | Status: DC | PRN
  Administered 2013-05-07 – 2013-05-09 (×4): 650 mg via ORAL
  Filled 2013-05-06 (×4): qty 2

## 2013-05-06 MED ORDER — ONDANSETRON HCL 4 MG/2ML IJ SOLN
4.0000 mg | Freq: Once | INTRAMUSCULAR | Status: AC | PRN
  Administered 2013-05-06: 4 mg via INTRAVENOUS

## 2013-05-06 MED ORDER — VANCOMYCIN HCL 1000 MG IV SOLR
INTRAVENOUS | Status: AC
  Filled 2013-05-06: qty 1000

## 2013-05-06 MED ORDER — OXYCODONE HCL 5 MG PO TABS: 5.0000 mg | ORAL_TABLET | Freq: Once | ORAL | Status: DC | PRN

## 2013-05-06 MED ORDER — BUPIVACAINE-EPINEPHRINE 0.25% -1:200000 IJ SOLN
INTRAMUSCULAR | Status: DC | PRN
  Administered 2013-05-06: 30 mL

## 2013-05-06 SURGICAL SUPPLY — 70 items
ANCH SUT 2 FT CRKSW 14.7X5.5 (Anchor) ×1 IMPLANT
ANCHOR CORKSCREW FIBER 5.5X15 (Anchor) ×1 IMPLANT
BANDAGE ESMARK 6X9 LF (GAUZE/BANDAGES/DRESSINGS) ×1 IMPLANT
BLADE SAGITTAL 25.0X1.19X90 (BLADE) ×2 IMPLANT
BNDG CMPR 9X6 STRL LF SNTH (GAUZE/BANDAGES/DRESSINGS) ×1
BNDG ESMARK 6X9 LF (GAUZE/BANDAGES/DRESSINGS) ×2
BOWL SMART MIX CTS (DISPOSABLE) ×2 IMPLANT
CAPT RP KNEE ×1 IMPLANT
CEMENT HV SMART SET (Cement) ×4 IMPLANT
CLOTH BEACON ORANGE TIMEOUT ST (SAFETY) ×1 IMPLANT
CONT SPEC STER OR (MISCELLANEOUS) ×1 IMPLANT
COVER BACK TABLE 24X17X13 BIG (DRAPES) ×1 IMPLANT
COVER SURGICAL LIGHT HANDLE (MISCELLANEOUS) ×2 IMPLANT
CUFF TOURNIQUET SINGLE 34IN LL (TOURNIQUET CUFF) ×1 IMPLANT
CUFF TOURNIQUET SINGLE 44IN (TOURNIQUET CUFF) IMPLANT
DRAPE EXTREMITY T 121X128X90 (DRAPE) ×2 IMPLANT
DRAPE PROXIMA HALF (DRAPES) ×2 IMPLANT
DRSG ADAPTIC 3X8 NADH LF (GAUZE/BANDAGES/DRESSINGS) ×2 IMPLANT
DRSG PAD ABDOMINAL 8X10 ST (GAUZE/BANDAGES/DRESSINGS) ×3 IMPLANT
DURAPREP 26ML APPLICATOR (WOUND CARE) ×2 IMPLANT
ELECT CAUTERY BLADE 6.4 (BLADE) ×3 IMPLANT
ELECT REM PT RETURN 9FT ADLT (ELECTROSURGICAL) ×2
ELECTRODE REM PT RTRN 9FT ADLT (ELECTROSURGICAL) ×1 IMPLANT
EVACUATOR 1/8 PVC DRAIN (DRAIN) ×1 IMPLANT
FACESHIELD LNG OPTICON STERILE (SAFETY) ×4 IMPLANT
FLOSEAL 10ML (HEMOSTASIS) IMPLANT
GLOVE BIO SURGEON STRL SZ7 (GLOVE) ×1 IMPLANT
GLOVE BIOGEL M STRL SZ7.5 (GLOVE) ×1 IMPLANT
GLOVE BIOGEL PI IND STRL 7.0 (GLOVE) IMPLANT
GLOVE BIOGEL PI IND STRL 8 (GLOVE) ×1 IMPLANT
GLOVE BIOGEL PI IND STRL 8.5 (GLOVE) ×1 IMPLANT
GLOVE BIOGEL PI INDICATOR 7.0 (GLOVE) ×1
GLOVE BIOGEL PI INDICATOR 8 (GLOVE) ×1
GLOVE BIOGEL PI INDICATOR 8.5 (GLOVE) ×1
GLOVE ECLIPSE 8.0 STRL XLNG CF (GLOVE) ×5 IMPLANT
GLOVE SURG ORTHO 8.5 STRL (GLOVE) ×4 IMPLANT
GOWN PREVENTION PLUS XXLARGE (GOWN DISPOSABLE) ×2 IMPLANT
GOWN STRL NON-REIN LRG LVL3 (GOWN DISPOSABLE) ×3 IMPLANT
GOWN STRL REIN 2XL XLG LVL4 (GOWN DISPOSABLE) ×1 IMPLANT
HANDPIECE INTERPULSE COAX TIP (DISPOSABLE) ×2
KIT BASIN OR (CUSTOM PROCEDURE TRAY) ×2 IMPLANT
KIT ROOM TURNOVER OR (KITS) ×2 IMPLANT
MANIFOLD NEPTUNE II (INSTRUMENTS) ×2 IMPLANT
NDL 1/2 CIR CATGUT .05X1.09 (NEEDLE) IMPLANT
NEEDLE 1/2 CIR CATGUT .05X1.09 (NEEDLE) ×2 IMPLANT
NEEDLE 22X1 1/2 (OR ONLY) (NEEDLE) ×1 IMPLANT
NS IRRIG 1000ML POUR BTL (IV SOLUTION) ×2 IMPLANT
PACK TOTAL JOINT (CUSTOM PROCEDURE TRAY) ×2 IMPLANT
PAD ARMBOARD 7.5X6 YLW CONV (MISCELLANEOUS) ×3 IMPLANT
PAD CAST 4YDX4 CTTN HI CHSV (CAST SUPPLIES) ×1 IMPLANT
PADDING CAST COTTON 4X4 STRL (CAST SUPPLIES) ×2
PADDING CAST COTTON 6X4 STRL (CAST SUPPLIES) ×2 IMPLANT
PENCIL BUTTON HOLSTER BLD 10FT (ELECTRODE) ×1 IMPLANT
SET HNDPC FAN SPRY TIP SCT (DISPOSABLE) ×1 IMPLANT
SPONGE GAUZE 4X4 12PLY (GAUZE/BANDAGES/DRESSINGS) ×2 IMPLANT
STAPLER VISISTAT 35W (STAPLE) ×2 IMPLANT
SUCTION FRAZIER TIP 10 FR DISP (SUCTIONS) ×2 IMPLANT
SUT BONE WAX W31G (SUTURE) ×2 IMPLANT
SUT ETHIBOND NAB CT1 #1 30IN (SUTURE) ×6 IMPLANT
SUT MNCRL AB 3-0 PS2 18 (SUTURE) ×2 IMPLANT
SUT VIC AB 0 CT1 27 (SUTURE) ×2
SUT VIC AB 0 CT1 27XBRD ANBCTR (SUTURE) ×1 IMPLANT
SUT VIC AB 1 CT1 27 (SUTURE) ×2
SUT VIC AB 1 CT1 27XBRD ANBCTR (SUTURE) ×1 IMPLANT
SYR CONTROL 10ML LL (SYRINGE) ×2 IMPLANT
TOWEL OR 17X24 6PK STRL BLUE (TOWEL DISPOSABLE) ×2 IMPLANT
TOWEL OR 17X26 10 PK STRL BLUE (TOWEL DISPOSABLE) ×2 IMPLANT
TRAY FOLEY CATH 16FRSI W/METER (SET/KITS/TRAYS/PACK) IMPLANT
WATER STERILE IRR 1000ML POUR (IV SOLUTION) ×3 IMPLANT
WRAP KNEE MAXI GEL POST OP (GAUZE/BANDAGES/DRESSINGS) ×2 IMPLANT

## 2013-05-06 NOTE — Anesthesia Procedure Notes (Signed)
Anesthesia Regional Block:  Femoral nerve block  Pre-Anesthetic Checklist: ,, timeout performed, Correct Patient, Correct Site, Correct Laterality, Correct Procedure, Correct Position, site marked, Risks and benefits discussed,  Surgical consent,  Pre-op evaluation,  At surgeon's request and post-op pain management  Laterality: Left  Prep: chloraprep       Needles:  Injection technique: Single-shot  Needle Type: Echogenic Stimulator Needle     Needle Length:cm 9 cm Needle Gauge: 21 and 21 G    Additional Needles:  Procedures: ultrasound guided (picture in chart) and nerve stimulator Femoral nerve block  Nerve Stimulator or Paresthesia:  Response: 0.4 mA,   Additional Responses:   Narrative:  Start time: 05/06/2013 9:20 AM End time: 05/06/2013 9:35 AM Injection made incrementally with aspirations every 5 mL.  Performed by: Personally  Anesthesiologist: Arta Bruce MD  Additional Notes: Monitors applied. Patient sedated. Sterile prep and drape,hand hygiene and sterile gloves were used. Relevant anatomy identified.Needle position confirmed.Local anesthetic injected incrementally after negative aspiration. Local anesthetic spread visualized around nerve(s). Vascular puncture avoided. No complications. Image printed for medical record.The patient tolerated the procedure well.       Femoral nerve block

## 2013-05-06 NOTE — Anesthesia Preprocedure Evaluation (Signed)
Anesthesia Evaluation  Patient identified by MRN, date of birth, ID band Patient awake    Reviewed: Allergy & Precautions, H&P , NPO status , Patient's Chart, lab work & pertinent test results  Airway Mallampati: I TM Distance: >3 FB Neck ROM: Full    Dental   Pulmonary          Cardiovascular + dysrhythmias + pacemaker     Neuro/Psych    GI/Hepatic   Endo/Other  diabetes, Well Controlled, Type 2, Oral Hypoglycemic Agents  Renal/GU      Musculoskeletal   Abdominal   Peds  Hematology   Anesthesia Other Findings   Reproductive/Obstetrics                           Anesthesia Physical Anesthesia Plan  ASA: II  Anesthesia Plan: General   Post-op Pain Management:    Induction: Intravenous  Airway Management Planned: LMA  Additional Equipment:   Intra-op Plan:   Post-operative Plan: Extubation in OR  Informed Consent: I have reviewed the patients History and Physical, chart, labs and discussed the procedure including the risks, benefits and alternatives for the proposed anesthesia with the patient or authorized representative who has indicated his/her understanding and acceptance.     Plan Discussed with: CRNA and Surgeon  Anesthesia Plan Comments:         Anesthesia Quick Evaluation

## 2013-05-06 NOTE — Op Note (Signed)
PATIENT ID:      Tracey Koch  MRN:     [id number] DOB/AGE:    09/03/1945 / 67 y.o.       OPERATIVE REPORT    DATE OF PROCEDURE:  05/06/2013       PREOPERATIVE DIAGNOSIS:   Left Knee Osteoarthritis with associated RA                                                       Estimated body mass index is 30.83 kg/(m^2) as calculated from the following:   Height as of 05/01/13: 5\' 3"  (1.6 m).   Weight as of 06/14/10: 78.926 kg (174 lb).     POSTOPERATIVE DIAGNOSIS:   Left Knee Osteoarthritis-same                                                                     Estimated body mass index is 30.83 kg/(m^2) as calculated from the following:   Height as of 05/01/13: 5\' 3"  (1.6 m).   Weight as of 06/14/10: 78.926 kg (174 lb).     PROCEDURE:  Procedure(s): TOTAL KNEE ARTHROPLASTY left     SURGEON:  [name], MD    ASSISTANT:   Jacqualine Code, PA-C   (Present and scrubbed throughout the case, critical for assistance with exposure, retraction, instrumentation, and closure.)          ANESTHESIA: regional and general     DRAINS: (clamped) Hemovact drain(s) in the left knee with  Suction Clamped :      TOURNIQUET TIME:  Total Tourniquet Time Documented: Thigh (Left) - 79 minutes Total: Thigh (Left) - 79 minutes     COMPLICATIONS:  None   CONDITION:  stable  PROCEDURE IN [id number]   Tracey Koch 05/06/2013, 12:08 PM

## 2013-05-06 NOTE — Anesthesia Postprocedure Evaluation (Signed)
Anesthesia Post Note  Patient: Tracey Koch  Procedure(s) Performed: Procedure(s) (LRB): TOTAL KNEE ARTHROPLASTY (Left)  Anesthesia type: general  Patient location: PACU  Post pain: Pain level controlled  Post assessment: Patient's Cardiovascular Status Stable  Last Vitals:  Filed Vitals:   05/06/13 1437  BP: 140/65  Pulse: 78  Temp:   Resp: 12    Post vital signs: Reviewed and stable  Level of consciousness: sedated  Complications: No apparent anesthesia complications

## 2013-05-06 NOTE — H&P (Signed)
  The recent History & Physical has been reviewed. I have personally examined the patient today. There is no interval change to the documented History & Physical. The patient would like to proceed with the procedure.  [name] W 05/06/2013,  9:45 AM

## 2013-05-06 NOTE — Progress Notes (Signed)
Patient could not provide urine sample at this time.

## 2013-05-06 NOTE — Progress Notes (Signed)
UR COMPLETED  

## 2013-05-06 NOTE — Plan of Care (Signed)
Problem: Consults Goal: Diagnosis- Total Joint Replacement Primary Total Knee     

## 2013-05-06 NOTE — Progress Notes (Signed)
Orthopedic Tech Progress Note Patient Details:  Tracey Koch 17-Jan-1946 [id number] CPM applied to Left LE with appropriate settings. OHF applied to bed.  CPM Left Knee CPM Left Knee: On Left Knee Flexion (Degrees): 60 Left Knee Extension (Degrees): 0   Asia R Thompson 05/06/2013, 1:23 PM

## 2013-05-06 NOTE — Transfer of Care (Signed)
Immediate Anesthesia Transfer of Care Note  Patient: Tracey Koch  Procedure(s) Performed: Procedure(s): TOTAL KNEE ARTHROPLASTY (Left)  Patient Location: PACU  Anesthesia Type:GA combined with regional for post-op pain  Level of Consciousness: awake, alert  and patient cooperative  Airway & Oxygen Therapy: Patient Spontanous Breathing and Patient connected to nasal cannula oxygen  Post-op Assessment: Report given to PACU RN, Post -op Vital signs reviewed and stable and Patient moving all extremities  Post vital signs: Reviewed and stable  Complications: No apparent anesthesia complications

## 2013-05-07 ENCOUNTER — Encounter (HOSPITAL_COMMUNITY): Payer: Self-pay | Admitting: General Practice

## 2013-05-07 LAB — BASIC METABOLIC PANEL
CO2: 23 mEq/L (ref 19–32)
Calcium: 8.1 mg/dL — ABNORMAL LOW (ref 8.4–10.5)
Creatinine, Ser: 1.06 mg/dL (ref 0.50–1.10)
GFR calc non Af Amer: 53 mL/min — ABNORMAL LOW (ref >90)
Glucose, Bld: 204 mg/dL — ABNORMAL HIGH (ref 70–99)
Sodium: 134 mEq/L — ABNORMAL LOW (ref 135–145)

## 2013-05-07 LAB — CBC
HCT: 31.7 % — ABNORMAL LOW (ref 36.0–46.0)
Hemoglobin: 10.7 g/dL — ABNORMAL LOW (ref 12.0–15.0)
MCH: 32.5 pg (ref 26.0–34.0)
MCHC: 33.8 g/dL (ref 30.0–36.0)
MCV: 96.4 fL (ref 78.0–100.0)
Platelets: 204 10*3/uL (ref 150–400)
RBC: 3.29 MIL/uL — ABNORMAL LOW (ref 3.87–5.11)
RDW: 15.5 % (ref 11.5–15.5)

## 2013-05-07 LAB — GLUCOSE, CAPILLARY
Glucose-Capillary: 214 mg/dL — ABNORMAL HIGH (ref 70–99)
Glucose-Capillary: 154 mg/dL — ABNORMAL HIGH (ref 70–99)
Glucose-Capillary: 184 mg/dL — ABNORMAL HIGH (ref 70–99)

## 2013-05-07 MED ORDER — MORPHINE SULFATE 2 MG/ML IJ SOLN: 1.0000 mg | INTRAMUSCULAR | Status: DC | PRN

## 2013-05-07 MED ORDER — SODIUM CHLORIDE 0.9 % IV SOLN
INTRAVENOUS | Status: DC
  Administered 2013-05-07: 20 mL/h via INTRAVENOUS

## 2013-05-07 NOTE — Progress Notes (Signed)
Patient ID: Tracey Koch, female   DOB: [birth year], 67 y.o.   MRN: [id number] PATIENT ID: Tracey Koch        MRN:  [id number]          DOB/AGE: 1945/08/23 / 67 y.o.    [name], MD   Jacqualine Code, PA-C [address] [place], [place]  [address]                             [phone]   PROGRESS NOTE  Subjective:  negative for Chest Pain  negative for Shortness of Breath  positive for Nausea/Vomiting   negative for Calf Pain    Tolerating Diet: no         Patient reports pain as mild.     Nausea and some vomiting after dilaudid  Objective: Vital signs in last 24 hours:   Patient Vitals for the past 24 hrs:  BP Temp Temp src Pulse Resp SpO2  05/07/13 0603 134/67 mmHg 99.9 F (37.7 C) - 94 18 97 %  05/06/13 2127 131/60 mmHg 98.1 F (36.7 C) - 86 18 98 %  05/06/13 1500 155/65 mmHg 97.4 F (36.3 C) - 73 16 100 %  05/06/13 1437 140/65 mmHg - - 78 12 100 %  05/06/13 1430 - 97.7 F (36.5 C) - - - -  05/06/13 1415 150/78 mmHg - - 81 11 100 %  05/06/13 1400 144/70 mmHg - - - - -  05/06/13 1345 151/70 mmHg - - 83 - 99 %  05/06/13 1330 140/80 mmHg - - 85 10 99 %  05/06/13 1315 142/73 mmHg - - 80 12 98 %  05/06/13 1300 145/72 mmHg - - - - -  05/06/13 1245 135/64 mmHg 98.4 F (36.9 C) - 100 12 94 %  05/06/13 0907 - - - 68 18 100 %  05/06/13 0835 165/66 mmHg 98.1 F (36.7 C) Oral 79 22 99 %      Intake/Output from previous day:   10/07 0701 - 10/08 0700 In: 1510 [P.O.:360; I.V.:1150] Out: 775 [Urine:675; Drains:100]   Intake/Output this shift:       Intake/Output     10/07 0701 - 10/08 0700 10/08 0701 - 10/09 0700   P.O. 360    I.V. 1150    Total Intake 1510     Urine 675    Drains 100    Total Output 775     Net +735             LABORATORY DATA:  Recent Labs  05/07/13 0615  WBC 9.2  HGB 10.7*  HCT 31.7*  PLT 204    Recent Labs  05/07/13 0615  NA 134*  K 4.1  CL 103  CO2 23  BUN 18  CREATININE 1.06  GLUCOSE 204*  CALCIUM 8.1*    Lab Results  Component Value Date   INR 1.11 05/17/2011    Recent Radiographic Studies :  Chest 2 View  05/01/2013   CLINICAL DATA:  Preoperative evaluation for knee replacement  EXAM: CHEST  2 VIEW  COMPARISON:  10/30/2011  FINDINGS: Pacing device is again noted. The cardiac shadow is stable. The lungs are clear bilaterally. No acute bony abnormality is noted.  IMPRESSION: No active cardiopulmonary disease.   Electronically Signed   By: Alcide Clever   On: 05/01/2013 16:38     Examination:  General appearance: alert, cooperative and no distress  Wound Exam: clean, dry,  intact   Drainage:  None: wound tissue dry  Motor Exam: EHL, FHL, Anterior Tibial and Posterior Tibial Intact  Sensory Exam: Superficial Peroneal, Deep Peroneal and Tibial normal  Vascular Exam: Normal  Assessment:    1 Day Post-Op  Procedure(s) (LRB): TOTAL KNEE ARTHROPLASTY (Left)  ADDITIONAL DIAGNOSIS:  Active Problems:   * No active hospital problems. *  post op nausea   Plan: Physical Therapy as ordered Partial Weight Bearing @ 50% (PWB)  DVT Prophylaxis:  Xarelto  DISCHARGE PLAN: Skilled Nursing Facility/Rehab-[facility]  DISCHARGE NEEDS: HHPT, CPM, Walker and 3-in-1 comode seat  OOB with PT, foley out, await urine and left knee culture reports, switch to MS to control nausea, has active bowel sounds       [name] 05/07/2013 7:36 AM

## 2013-05-07 NOTE — Clinical Social Work Psychosocial (Signed)
Clinical Social Work Department  BRIEF PSYCHOSOCIAL ASSESSMENT  Patient: Tracey Koch  Account Number: [id number]  Admit date: 05/06/13 Clinical Social Worker Sabino Niemann, MSW Date/Time: 05/07/2013 3:45 PM Referred by: Physician Date Referred:  05/06/13 Referred for   SNF Placement   Other Referral:  Interview type: Patient and patient's husband Other interview type: PSYCHOSOCIAL DATA  Living Status:Husband Admitted from facility:  Level of care:  Primary support name: Lohn,Johnny  Primary support relationship to patient:  Husband Degree of support available:  Strong and vested  CURRENT CONCERNS  Current Concerns   Post-Acute Placement   Other Concerns:  SOCIAL WORK ASSESSMENT / PLAN  CSW met with pt re: PT recommendation for SNF.   Pt lives with her husband  CSW explained placement process and answered questions.   Pt reports [facility]   as her preference    CSW completed FL2 and initiated SNF search.     Assessment/plan status: Information/Referral to [facility]  Other assessment/ plan:  Information/referral to community resources:  SNF     PATIENT'S/FAMILY'S RESPONSE TO PLAN OF CARE:  Pt  reports she is agreeable to [facility] SNF in order to increase strength and independence with mobility prior to returning home  Pt verbalized understanding of placement process and appreciation for CSW assist.   Sabino Niemann, MSW [phone]

## 2013-05-07 NOTE — Op Note (Signed)
Tracey Koch, Tracey Koch             ACCOUNT NO.:  [id number]  MEDICAL RECORD NO.:  [id number]  LOCATION:  5N19C                        FACILITY:  [facility]  PHYSICIAN:  Claude Manges. Laparis Durrett, M.D.DATE OF BIRTH:  06/29/1946  DATE OF PROCEDURE:  05/06/2013 DATE OF DISCHARGE:                              OPERATIVE REPORT   PREOPERATIVE DIAGNOSIS:  End-stage osteoarthritis/rheumatoid arthritis, left knee.  POSTOPERATIVE DIAGNOSIS:  End-stage osteoarthritis/rheumatoid arthritis, left knee.  PROCEDURE:  Left total knee replacement.  SURGEON:  Claude Manges. Cleophas Dunker, MD  ASSISTANT:  Jacqualine Code PA-C was present throughout the operative procedure to ensure its timely completion.  ANESTHESIA:  General with left femoral nerve block.  COMPLICATIONS:  None.  COMPONENTS:  DePuy LCS standard femoral component.  A #3 rotating tibial keeled tray and 10-mm bridging bearing, a 3 PEG metal back rotating patella.  Components were secured with polymethylmethacrylate with added gram of vancomycin.  PROCEDURE IN DETAIL:  [name] was met with her husband in the holding area, identified the left knee as the appropriate operative site.  She had a Foley catheter inserted in the preop area, and a cath urine specimen was sent.  She does have an increased white cells and has a prior history of urinary tract infection.  She has been afebrile with normal white count, and we will await cultures and identified the left as the appropriate operative site.  The patient was then transported to room #7 and placed under general anesthesia without difficulty.  Tourniquet was applied to the left thigh and the leg was then prepped with chlorhexidine scrub and then DuraPrep from the tourniquet to the toes.  Sterile draping was performed.  With the extremity elevated with Esmarch exsanguinated with a proximal tourniquet at 350 mmHg, a time-out was called.  Midline longitudinal incision was made centered about the  patella extending from the superior pouch to the tibial tubercle via sharp dissection incision was carried down to subcutaneous tissue.  Small bleeders were Bovie coagulated.  The first layer of capsule was incised in midline.  A medial parapatellar incision was then made with the Bovie to the deep capsule.  The joint was entered.  There was a cloudy joint fluid.  This was sent for stat Gram stain and culture and sensitivity. Gram stain revealed numerous polys, but no organisms identified.  With a history of rheumatoid arthritis, it certainly could be consistent. There was a large pannus formation with areas of erosion about the both femur as well as the tibia.  She did have a fixed flexure contracture preoperatively of least 15 degrees.  With the knee flexed 90 degrees and the patella everted 180 degrees, the joint was then further explored.  I did an extensive synovectomy and measured a standard femoral component.  The first bony cut was made transversely on the tibia using the external tibial guide with the 7-degree angle of declination at each bony cut on both the tibia and femur.  We used the external guide to check our alignment and to make any corrections.  Subsequent cuts were then made on the femur using the standard femoral guide, 10 mm flexion and extension gaps were perfectly symmetrical and we had completely eliminated  the preoperative fixed flexure contracture.  The tissue was very poor and the capsule would avulse from the bone and there was a partial avulsion of the patellar tendon despite our attempt to be as careful as we could.  This was subsequently repaired.  Otherwise MCL and LCL remained intact.  The final cut on the tibia was then made for tapering into.  Laminar spurs were then inserted.  I removed the medial and lateral menisci as well as ACL and PCL and any osteophytes in the posterior femoral condyles medially and laterally.  The bone was soft.  We  then measured a #3 rotating keeled tibial tray.  This was pinned in place.  A center hole was then made followed by the keeled cut.  With the tibial trial in place, we inserted the 10-mm bridging bearing and then inserted the standard femoral component.  This was then reduced and through a full range of motion, we had excellent stability.  I completely eliminate the flexion contracture, full extension, no opening with varus or valgus stress.  Patella was then prepared by removing approximately 8 mm of bone leaving the patella thickness of approximately 12 mm.  The trial patella was then inserted and the appropriate holes were made with the trial and through full range of motion remained stable.  We, at that point, noticed that there was a partial avulsion of the patellar tendon.  The trial components were then removed.  We copiously irrigated the joint with saline solution.  We checked to be sure we had done an extensive synovectomy.  The final components were then impacted with polymethylmethacrylate.  We initially inserted the tibial tray followed by the 10-mm bridging bearing and then the femoral component.  The joint was then reduced.  We had nice tight fit and any extraneous methacrylate was removed from around the periphery of the components.  We did use 1 g of the vancomycin given the patient's multiple comorbidities including her diabetes, she smokes, and is on methotrexate for her rheumatoid arthritis.  While waiting for the methacrylate to mature and after inserting the patella with the same polymethyl methacrylate in the patellar clamp, we injected the deep capsule with 0.25% Marcaine with epinephrine, approximately 16 minutes, the methacrylate hardened.  The joint was then explored.  We re-irrigated and removed any further synovitis.  I used a single 5.5-mm peek screw and anchor to reattached the partially avulsed patellar tendon.  We had a nice fit and the edges were  then secured to the medial capsule with the same #2 FiberWire.  The tourniquet was deflated.  Gross bleeders were Bovie coagulated.  We had a nice dry field.  A medium-sized Hemovac was placed laterally.  A deep capsule was then closed with interrupted #1 Ethibond, did run the Ethibond along the last several inches of capsule proximally and then I placed the knee through a full range of motion.  The patella tracked in the midline. Again, no opening with varus or valgus stress.  The superficial capsule was closed with running 0 Vicryl, subcu with 3-0 Monocryl.  Skin closed with skin clips.  Sterile bulky dressing was applied followed by the patient's support stocking.  Ms. Utke did not have any anesthetic problems.  She was awoke and returned to the postanesthesia recovery room in satisfactory condition.     Claude Manges. Cleophas Dunker, M.D.     PWW/MEDQ  D:  05/06/2013  T:  05/07/2013  Job:  [id number]

## 2013-05-07 NOTE — Progress Notes (Signed)
OT Cancellation Note  Patient Details Name: Tracey Koch MRN: [id number] DOB: April 06, 1946   Cancelled Treatment:    Reason Eval/Treat Not Completed: Fatigue/lethargy limiting ability to participate. Pt had just finished working with PT and now back in bed.  Declining OT session at this time. Will re-attempt next date.  05/07/2013 Cipriano Mile OTR/L Pager [phone] Office [phone]

## 2013-05-07 NOTE — Clinical Social Work Placement (Signed)
Clinical Social Work Department  CLINICAL SOCIAL WORK PLACEMENT NOTE   Patient: Radha Coggins  Account Number: [id number]  Admit date: 05/06/13 Clinical Social Worker: Sabino Niemann LCSWA Date/time: 05/07/2013 11:30 AM  Clinical Social Work is seeking post-discharge placement for this patient at the following level of care: SKILLED NURSING (*CSW will update this form in Epic as items are completed)  10/8/2014Patient/family provided with [facility] Department of Clinical Social Work's list of facilities offering this level of care within the geographic area requested by the patient (or if unable, by the patient's family).  05/07/2013 Patient/family informed of their freedom to choose among providers that offer the needed level of care, that participate in Medicare, Medicaid or managed care program needed by the patient, have an available bed and are willing to accept the patient.  05/07/2013 Patient/family informed of [facility]' ownership interest in [facility], as well as of the fact that they are under no obligation to receive care at this facility.  PASARR submitted to EDS on   PASARR number received from EDS on  FL2 transmitted to all facilities in geographic area requested by pt/family on 05/07/2013 FL2 transmitted to all facilities within larger geographic area on  Patient informed that his/her managed care company has contracts with or will negotiate with certain facilities, including the following:  Patient/family informed of bed offers received:  Patient chooses bed at  Physician recommends and patient chooses bed at  Patient to be transferred to on  Patient to be transferred to facility by  The following physician request were entered in Epic:  Additional Comments:

## 2013-05-07 NOTE — Progress Notes (Signed)
Physical Therapy Treatment Patient Details Name: Tracey Koch MRN: [id number] DOB: [birth year] Today's Date: 05/07/2013 Time: 4098-1191 PT Time Calculation (min): 34 min  PT Assessment / Plan / Recommendation  History of Present Illness Pt is a 67 y/o female s/p L TKA on 05/06/2013.    PT Comments   Pt progressing towards physical therapy goals. Pt with difficulty maintaining upright posture during ambulation with RW and requires increased cueing for LPWB status. 2 person assist for transfers to/from EOB and for chair follow necessary for safety. Pt on CPM at end of session -1-70.   Follow Up Recommendations  SNF     Does the patient have the potential to tolerate intense rehabilitation     Barriers to Discharge        Equipment Recommendations  None recommended by PT    Recommendations for Other Services    Frequency 7X/week   Progress towards PT Goals Progress towards PT goals: Progressing toward goals  Plan Current plan remains appropriate    Precautions / Restrictions Precautions Precautions: Fall;Knee Restrictions Weight Bearing Restrictions: Yes LLE Weight Bearing: Partial weight bearing LLE Partial Weight Bearing Percentage or Pounds: 50   Pertinent Vitals/Pain Pt reports 7/10 pain throughout session.    Mobility  Bed Mobility Bed Mobility: Sit to Supine;Scooting to HOB Sit to Supine: 1: +2 Total assist;HOB flat;With rail Sit to Supine: Patient Percentage: 50% Scooting to HOB: 1: +2 Total assist;With rail Scooting to [facility]: Patient Percentage: 50% Details for Bed Mobility Assistance: VC's for sequencing and technique. Pt required increased assist for L LE support onto the bed and trunk support while lowering to supine position. Transfers Transfers: Sit to Stand;Stand to Sit Sit to Stand: 1: +2 Total assist;From chair/3-in-1;With upper extremity assist Sit to Stand: Patient Percentage: 50% Stand to Sit: 3: Mod assist;To chair/3-in-1;With armrests Details for  Transfer Assistance: VC's for hand placement on seated surface, and L LE extended for comfort prior to initiating transfers. Ambulation/Gait Ambulation/Gait Assistance: 3: Mod assist Ambulation Distance (Feet): 15 Feet Assistive device: Rolling walker Ambulation/Gait Assistance Details: VC's for hand placement on the RW as pt frequently reached for other objects to hold on to or rested elbows on walker handles. Gait Pattern: Step-to pattern;Decreased stride length;Trunk flexed Gait velocity: decreased Stairs: No    Exercises Total Joint Exercises Ankle Circles/Pumps: 10 reps;Both Heel Slides: 5 reps;Left Goniometric ROM: 10-68   PT Diagnosis:    PT Problem List:   PT Treatment Interventions:     PT Goals (current goals can now be found in the care plan section) Acute Rehab PT Goals Patient Stated Goal: to return home after SNF PT Goal Formulation: With patient/family Time For Goal Achievement: 05/14/13 Potential to Achieve Goals: Good  Visit Information  Last PT Received On: 05/07/13 Assistance Needed: +2 (Chair follow) History of Present Illness: Pt is a 67 y/o female s/p L TKA on 05/06/2013.     Subjective Data  Subjective: "My arms are weak with this walker." Patient Stated Goal: to return home after SNF   Cognition  Cognition Arousal/Alertness: Awake/alert Behavior During Therapy: WFL for tasks assessed/performed Overall Cognitive Status: Within Functional Limits for tasks assessed    Balance     End of Session PT - End of Session Equipment Utilized During Treatment: Gait belt Activity Tolerance: Patient limited by fatigue;Patient limited by pain Patient left: in bed;in CPM;with call bell/phone within reach;with family/visitor present Nurse Communication: Mobility status   GP     Ruthann Cancer 05/07/2013, 4:12 PM  Ruthann Cancer, PT, DPT Acute Rehabilitation Services

## 2013-05-07 NOTE — Evaluation (Signed)
Physical Therapy Evaluation Patient Details Name: Tracey Koch MRN: [id number] DOB: [birth year] Today's Date: 05/07/2013 Time: 7829-5621 PT Time Calculation (min): 23 min  PT Assessment / Plan / Recommendation History of Present Illness  Pt is a 67 y/o female s/p L TKA on 05/06/2013.   Clinical Impression  This patient presents with decreased AROM and strength s/p L TKA. At the time of PT eval, pt was able to perform transfers and ambulation with mod assist and +2 assist for safety. This patient is appropriate for skilled PT interventions to address functional limitations, improve safety and independence with functional mobility, and return to PLOF.    PT Assessment  Patient needs continued PT services    Follow Up Recommendations  SNF    Does the patient have the potential to tolerate intense rehabilitation      Barriers to Discharge Inaccessible home environment;Decreased caregiver support      Equipment Recommendations  None recommended by PT    Recommendations for Other Services     Frequency 7X/week    Precautions / Restrictions Precautions Precautions: Fall;Knee Restrictions Weight Bearing Restrictions: Yes LLE Weight Bearing: Partial weight bearing LLE Partial Weight Bearing Percentage or Pounds: 50   Pertinent Vitals/Pain Pt reports 7/10 pain during transfer to chair.      Mobility  Bed Mobility Bed Mobility: Supine to Sit;Sitting - Scoot to Edge of Bed Supine to Sit: 1: +2 Total assist;With rails Supine to Sit: Patient Percentage: 50% Sitting - Scoot to Edge of Bed: 3: Mod assist Details for Bed Mobility Assistance: VC's for sequencing and safety awareness with transfer to EOB. Increased assist needed for trunk elevation to full sitting. Transfers Transfers: Sit to Stand;Stand to Sit Sit to Stand: 1: +2 Total assist;From bed;With upper extremity assist Sit to Stand: Patient Percentage: 50% Stand to Sit: 3: Mod assist;To chair/3-in-1;With armrests Details  for Transfer Assistance: VC's for hand placement on seated surface prior to initiating transfers. Pt began to sit without warning prior to being all the way at the chair. Ambulation/Gait Ambulation/Gait Assistance: 3: Mod assist Ambulation Distance (Feet): 5 Feet Assistive device: Rolling walker Ambulation/Gait Assistance Details: VC's for sequencing with the RW and to maintain PWB status. Assist was required to [facility] pt as she was stepping with the R LE. Gait Pattern: Step-to pattern;Decreased stride length;Shuffle;Narrow base of support Gait velocity: decreased Stairs: No    Exercises     PT Diagnosis: Difficulty walking;Acute pain  PT Problem List: Decreased strength;Decreased range of motion;Decreased activity tolerance;Decreased balance;Decreased mobility;Decreased knowledge of use of DME;Decreased safety awareness;Decreased knowledge of precautions;Pain PT Treatment Interventions: DME instruction;Gait training;Stair training;Functional mobility training;Therapeutic activities;Therapeutic exercise;Neuromuscular re-education;Patient/family education     PT Goals(Current goals can be found in the care plan section) Acute Rehab PT Goals Patient Stated Goal: to return home after SNF PT Goal Formulation: With patient/family Time For Goal Achievement: 05/14/13 Potential to Achieve Goals: Good  Visit Information  Last PT Received On: 05/07/13 Assistance Needed: +2 History of Present Illness: Pt is a 67 y/o female s/p L TKA on 05/06/2013.        Prior Functioning  Home Living Family/patient expects to be discharged to:: Skilled nursing facility [facility] ) Living Arrangements: Spouse/significant other Prior Function Level of Independence: Independent with assistive device(s) Communication Communication: No difficulties Dominant Hand: Right    Cognition  Cognition Arousal/Alertness: Awake/alert Behavior During Therapy: WFL for tasks assessed/performed Overall Cognitive  Status: Within Functional Limits for tasks assessed    Extremity/Trunk Assessment Upper Extremity Assessment Upper  Extremity Assessment: Defer to OT evaluation Lower Extremity Assessment Lower Extremity Assessment: LLE deficits/detail LLE Deficits / Details: Decreased AROM and strength consistent with L TKA. Cervical / Trunk Assessment Cervical / Trunk Assessment: Kyphotic   Balance    End of Session PT - End of Session Equipment Utilized During Treatment: Gait belt Activity Tolerance: Patient limited by fatigue;Patient limited by pain Patient left: in chair;with call bell/phone within reach;with family/visitor present Nurse Communication: Mobility status CPM Left Knee CPM Left Knee: Off  GP     Ruthann Cancer 05/07/2013, 12:15 PM  Ruthann Cancer, PT, DPT Acute Rehabilitation Services

## 2013-05-08 DIAGNOSIS — M1712 Unilateral primary osteoarthritis, left knee: Secondary | ICD-10-CM | POA: Diagnosis present

## 2013-05-08 LAB — URINE CULTURE: Colony Count: >=100000

## 2013-05-08 LAB — CBC
MCH: 32.4 pg (ref 26.0–34.0)
MCHC: 34.3 g/dL (ref 30.0–36.0)
Platelets: 180 10*3/uL (ref 150–400)
RBC: 3.27 MIL/uL — ABNORMAL LOW (ref 3.87–5.11)
RDW: 14.9 % (ref 11.5–15.5)
WBC: 12.2 10*3/uL — ABNORMAL HIGH (ref 4.0–10.5)

## 2013-05-08 LAB — GLUCOSE, CAPILLARY
Glucose-Capillary: 159 mg/dL — ABNORMAL HIGH (ref 70–99)
Glucose-Capillary: 171 mg/dL — ABNORMAL HIGH (ref 70–99)
Glucose-Capillary: 260 mg/dL — ABNORMAL HIGH (ref 70–99)
Glucose-Capillary: 148 mg/dL — ABNORMAL HIGH (ref 70–99)

## 2013-05-08 LAB — BASIC METABOLIC PANEL
BUN: 15 mg/dL (ref 6–23)
CO2: 23 mEq/L (ref 19–32)
Calcium: 8.5 mg/dL (ref 8.4–10.5)
Chloride: 104 mEq/L (ref 96–112)
Creatinine, Ser: 1.02 mg/dL (ref 0.50–1.10)
GFR calc Af Amer: 64 mL/min — ABNORMAL LOW (ref >90)
GFR calc non Af Amer: 55 mL/min — ABNORMAL LOW (ref >90)
Glucose, Bld: 165 mg/dL — ABNORMAL HIGH (ref 70–99)
Potassium: 3.9 mEq/L (ref 3.5–5.1)
Sodium: 135 mEq/L (ref 135–145)

## 2013-05-08 MED ORDER — FOSFOMYCIN TROMETHAMINE 3 G PO PACK: 3.0000 g | PACK | ORAL | Status: DC

## 2013-05-08 MED ORDER — METHOCARBAMOL 500 MG PO TABS: 500.0000 mg | ORAL_TABLET | Freq: Three times a day (TID) | ORAL | Status: DC | PRN

## 2013-05-08 MED ORDER — RIVAROXABAN 10 MG PO TABS: 10.0000 mg | ORAL_TABLET | Freq: Every day | ORAL | Status: DC

## 2013-05-08 MED ORDER — NICOTINE 21 MG/24HR TD PT24: 1.0000 | MEDICATED_PATCH | Freq: Every day | TRANSDERMAL | Status: DC

## 2013-05-08 MED ORDER — DSS 100 MG PO CAPS: 100.0000 mg | ORAL_CAPSULE | Freq: Two times a day (BID) | ORAL | Status: DC

## 2013-05-08 MED ORDER — TRAMADOL HCL 50 MG PO TABS: 100.0000 mg | ORAL_TABLET | Freq: Four times a day (QID) | ORAL | Status: DC | PRN

## 2013-05-08 MED ORDER — CEPHALEXIN 500 MG PO CAPS: 500.0000 mg | ORAL_CAPSULE | Freq: Two times a day (BID) | ORAL | Status: DC

## 2013-05-08 MED ORDER — FOSFOMYCIN TROMETHAMINE 3 G PO PACK
3.0000 g | PACK | ORAL | Status: DC
  Administered 2013-05-08: 3 g via ORAL
  Filled 2013-05-08: qty 3

## 2013-05-08 NOTE — Progress Notes (Signed)
OT Cancellation Note and Discharge  Patient Details Name: Tracey Koch MRN: [id number] DOB: 04-07-1946   Cancelled Treatment:    Reason Eval/Treat Not Completed: Other (comment). Pt's plan is to go to SNF and pt is Medicare, will defer OT eval and treatment to that facility. Acute OT will sign off.  Evette Georges [phone] 05/08/2013, 8:08 AM

## 2013-05-08 NOTE — Progress Notes (Signed)
Physical Therapy Treatment Patient Details Name: Tracey Koch MRN: [id number] DOB: May 15, 1946 Today's Date: 05/08/2013 Time: 7846-9629 PT Time Calculation (min): 30 min  PT Assessment / Plan / Recommendation  History of Present Illness Pt is a 67 y/o female s/p L TKA on 05/06/2013.    PT Comments   Pt significantly limited by pain and stiffness in the L knee. She was unable to initiate movement in L LE during ambulation and required assist to advance foot forward while gait training. Pt left in CPM set from -1-75 at 1525. +2 assist required for safety.  Follow Up Recommendations  SNF     Does the patient have the potential to tolerate intense rehabilitation     Barriers to Discharge        Equipment Recommendations  None recommended by PT    Recommendations for Other Services    Frequency 7X/week   Progress towards PT Goals Progress towards PT goals: Progressing toward goals  Plan Current plan remains appropriate    Precautions / Restrictions Precautions Precautions: Fall;Knee Restrictions Weight Bearing Restrictions: Yes LLE Weight Bearing: Partial weight bearing LLE Partial Weight Bearing Percentage or Pounds: 50   Pertinent Vitals/Pain Pt reports unrated pain in L knee and states she had pain medication prior to session beginning.     Mobility  Bed Mobility Bed Mobility: Supine to Sit;Sitting - Scoot to Edge of Bed Sit to Supine: 1: +2 Total assist;HOB flat;With rail Sit to Supine: Patient Percentage: 50% Scooting to HOB: 1: +2 Total assist;With rail Scooting to [facility]: Patient Percentage: 50% Details for Bed Mobility Assistance: VC's for sequencing and technique. Pt moving very slow due to pain Transfers Transfers: Sit to Stand;Stand to Sit Sit to Stand: 1: +2 Total assist;From chair/3-in-1;With upper extremity assist Sit to Stand: Patient Percentage: 30% Stand to Sit: 3: Mod assist;To chair/3-in-1;With armrests Details for Transfer Assistance: Pt with increased  difficulty completing transfers due to knee pain and stiffness. Does not make corrective changes with cueing. Ambulation/Gait Ambulation/Gait Assistance: 3: Mod assist Ambulation Distance (Feet): 5 Feet Assistive device: Rolling walker Ambulation/Gait Assistance Details: Pt unable to take more than a few steps due to pain and stiffness in the L knee. Pt took frequent seated rest breaks and reequired assist to advance L foot forward. Gait Pattern: Step-to pattern;Decreased stride length;Trunk flexed Gait velocity: decreased Stairs: No    Exercises Total Joint Exercises Heel Slides: 5 reps;Seated;Left   PT Diagnosis:    PT Problem List:   PT Treatment Interventions:     PT Goals (current goals can now be found in the care plan section) Acute Rehab PT Goals Patient Stated Goal: to return home after SNF PT Goal Formulation: With patient/family Time For Goal Achievement: 05/14/13 Potential to Achieve Goals: Good  Visit Information  Last PT Received On: 05/08/13 Assistance Needed: +2 History of Present Illness: Pt is a 67 y/o female s/p L TKA on 05/06/2013.     Subjective Data  Subjective: Pt very tearful and cries out a lot during session due to pain. Patient Stated Goal: to return home after SNF   Cognition  Cognition Arousal/Alertness: Awake/alert Behavior During Therapy: WFL for tasks assessed/performed Overall Cognitive Status: Within Functional Limits for tasks assessed    Balance     End of Session PT - End of Session Equipment Utilized During Treatment: Gait belt Activity Tolerance: Patient limited by fatigue;Patient limited by pain Patient left: in bed;in CPM;with call bell/phone within reach;with family/visitor present Nurse Communication: Mobility status CPM Left  Knee CPM Left Knee: On Left Knee Flexion (Degrees): 70   GP     Ruthann Cancer 05/08/2013, 4:47 PM  Ruthann Cancer, PT, DPT Acute Rehabilitation Services

## 2013-05-08 NOTE — Progress Notes (Signed)
Patient ID: Tracey Koch, female   DOB: 1945/10/22, 67 y.o.   MRN: [id number] PATIENT ID: Tracey Koch        MRN:  [id number]          DOB/AGE: 14-Jan-1946 / 67 y.o.    [name], MD   Jacqualine Code, PA-C [address] [place], [place]  [address]                             [phone]   PROGRESS NOTE  Subjective:  negative for Chest Pain  negative for Shortness of Breath  negative for Nausea/Vomiting   negative for Calf Pain    Tolerating Diet: yes         Patient reports pain as mild.     Pain with movement, comfortable at rest  Objective: Vital signs in last 24 hours:   Patient Vitals for the past 24 hrs:  BP Temp Pulse Resp SpO2  05/08/13 0502 142/84 mmHg 98.5 F (36.9 C) 86 16 95 %  05/07/13 2138 165/78 mmHg 100.3 F (37.9 C) 109 16 96 %  05/07/13 1333 141/60 mmHg 98.7 F (37.1 C) 98 17 97 %      Intake/Output from previous day:   10/08 0701 - 10/09 0700 In: 240 [P.O.:240] Out: 325 [Urine:175; Drains:150]   Intake/Output this shift:       Intake/Output     10/08 0701 - 10/09 0700 10/09 0701 - 10/10 0700   P.O. 240    I.V.     Total Intake 240     Urine 175    Drains 150    Total Output 325     Net -85          Urine Occurrence 2 x    Stool Occurrence 1 x       LABORATORY DATA:  Recent Labs  05/07/13 0615  WBC 9.2  HGB 10.7*  HCT 31.7*  PLT 204    Recent Labs  05/07/13 0615  NA 134*  K 4.1  CL 103  CO2 23  BUN 18  CREATININE 1.06  GLUCOSE 204*  CALCIUM 8.1*   Lab Results  Component Value Date   INR 1.11 05/17/2011    Recent Radiographic Studies :  Chest 2 View  05/01/2013   CLINICAL DATA:  Preoperative evaluation for knee replacement  EXAM: CHEST  2 VIEW  COMPARISON:  10/30/2011  FINDINGS: Pacing device is again noted. The cardiac shadow is stable. The lungs are clear bilaterally. No acute bony abnormality is noted.  IMPRESSION: No active cardiopulmonary disease.   Electronically Signed   By: Alcide Clever   On:  05/01/2013 16:38     Examination:  General appearance: alert, cooperative and no distress  Wound Exam: clean, dry, intact   Drainage:  None: wound tissue dry  Motor Exam: EHL, FHL, Anterior Tibial and Posterior Tibial  intact  Sensory Exam: history of peripheral neuropathy related to DM diminished sensation  Vascular Exam: both feet warm  Assessment:    2 Days Post-Op  Procedure(s) (LRB): TOTAL KNEE ARTHROPLASTY (Left)  ADDITIONAL DIAGNOSIS:  Active Problems:   * No active hospital problems. *  Nausea resolved   Plan: Physical Therapy as ordered Partial Weight Bearing @ 50% (PWB)  DVT Prophylaxis:  Xarelto  DISCHARGE PLAN: Skilled Nursing Facility/Rehab  DISCHARGE NEEDS: HHPT and Walker    Plan on D/C in am to [facility] in [place] changed-wound clean  dry, will Rx with cipro for 5 days with E. Coli UTI, synovial fluid from left knee culture no growth at two days    [name] 05/08/2013 7:24 AM

## 2013-05-08 NOTE — Discharge Summary (Signed)
[name], MD   Jacqualine Code, PA-C [address], [place], [place]  [address]                             [phone]  PATIENT ID: Tracey Koch        MRN:  [id number]          DOB/AGE: 1946/04/08 / 67 y.o.    DISCHARGE SUMMARY  ADMISSION DATE:    05/06/2013 DISCHARGE DATE:   05/09/2013   ADMISSION DIAGNOSIS: Left Knee Osteoarthritis    DISCHARGE DIAGNOSIS:  Left Knee Osteoarthritis    ADDITIONAL DIAGNOSIS: Principal Problem:   Osteoarthritis of left knee  Past Medical History  Diagnosis Date  . Hyperlipidemia     takes Simvastatin nightly  . Seasonal allergies     takes Allegra daily  . Depression     takes Prozac at bedtime  . Diabetes mellitus without complication     takes Metformin daily  . Sinoatrial node dysfunction     has ICD  . Anemia     takes Folic Acid daily  . Arthritis     takes Plaquenil daily and Methotrexate 2 times a week  . Family history of anesthesia complication     sister post op N/V  . Pacemaker   . Cough     takes Allegra daily  . Joint pain   . Joint swelling   . History of UTI   . History of blood transfusion     PROCEDURE: Procedure(s): TOTAL KNEE ARTHROPLASTY Left on 05/06/2013  CONSULTS: none     HISTORY:  Tracey Koch is a 67 year old white female who is seen today for evaluation of her left knee. She has been having pain and discomfort in this left knee for several months. She was last evaluated back in May of 2014 and at that time she was having some problems with a urinary tract infection and was sent to a specialist. She is on methotrexate by Dr. Kellie Simmering. She unfortunately has come to the point where she is losing motion in her knee and she has pain with every step. She is unable to fully straighten the knee or fully bend the knee either. Her bad days outnumber her good days. She walks with a walker at this time. Her pain is progressive. She has had several injections into the left knee by Dr. Kellie Simmering as well as Dr.  Cleophas Dunker. She denies any neurovascular compromise.   HOSPITAL COURSE:  Ireanna Finlayson is a 67 y.o. admitted on 05/06/2013 and found to have a diagnosis of Left Knee Osteoarthritis.  After appropriate laboratory studies were obtained  they were taken to the operating room on 05/06/2013 and underwent  Procedure(s): TOTAL KNEE ARTHROPLASTY  Left.   They were given perioperative antibiotics:  Anti-infectives   Start     Dose/Rate Route Frequency Ordered Stop   05/08/13 1030  cephALEXin (KEFLEX) capsule 500 mg  Status:  Discontinued     500 mg Oral Every 12 hours 05/08/13 1027 05/08/13 1104   05/06/13 2200  vancomycin (VANCOCIN) IVPB 1000 mg/200 mL premix     1,000 mg 200 mL/hr over 60 Minutes Intravenous Every 12 hours 05/06/13 1509 05/06/13 2242   05/06/13 2000  ciprofloxacin (CIPRO) tablet 500 mg  Status:  Discontinued    Comments:  Pt has history of multiple UTI's with pre op U/A showing increased WBC's and pos nitrite   500 mg Oral 2 times daily 05/06/13  1510 05/08/13 1024   05/06/13 1133  vancomycin (VANCOCIN) powder  Status:  Discontinued       As needed 05/06/13 1133 05/06/13 1232   05/06/13 0600  vancomycin (VANCOCIN) IVPB 1000 mg/200 mL premix     1,000 mg 200 mL/hr over 60 Minutes Intravenous On call to O.R. 05/05/13 1248 05/06/13 1015    .  Tolerated the procedure well.  Placed with a foley intraoperatively.    Toradol was given post op.  POD #1, allowed out of bed to a chair.  PT for ambulation and exercise program.  Foley D/C'd in morning.  IV saline locked.  O2 discontionued.  Hemovac pulled.  POD #2, continued PT and ambulation. Placed on Fosfomycin for UTI.  Dressing changed . POD #3, continued PT and ambulation The remainder of the hospital course was dedicated to ambulation and strengthening.   The patient was discharged on 2 Days Post-Op in  Stable condition.  Blood products given:none  DIAGNOSTIC STUDIES: Recent vital signs:  Patient Vitals for the past 24 hrs:   BP Temp Pulse Resp SpO2  05/08/13 0502 142/84 mmHg 98.5 F (36.9 C) 86 16 95 %  05/07/13 2138 165/78 mmHg 100.3 F (37.9 C) 109 16 96 %  05/07/13 1333 141/60 mmHg 98.7 F (37.1 C) 98 17 97 %       Recent laboratory studies:  Recent Labs  05/07/13 0615 05/08/13 0725  WBC 9.2 12.2*  HGB 10.7* 10.6*  HCT 31.7* 30.9*  PLT 204 180    Recent Labs  05/07/13 0615 05/08/13 0725  NA 134* 135  K 4.1 3.9  CL 103 104  CO2 23 23  BUN 18 15  CREATININE 1.06 1.02  GLUCOSE 204* 165*  CALCIUM 8.1* 8.5   Lab Results  Component Value Date   INR 1.11 05/17/2011     Recent Radiographic Studies :  Chest 2 View  05/01/2013   CLINICAL DATA:  Preoperative evaluation for knee replacement  EXAM: CHEST  2 VIEW  COMPARISON:  10/30/2011  FINDINGS: Pacing device is again noted. The cardiac shadow is stable. The lungs are clear bilaterally. No acute bony abnormality is noted.  IMPRESSION: No active cardiopulmonary disease.   Electronically Signed   By: Alcide Clever   On: 05/01/2013 16:38    DISCHARGE INSTRUCTIONS:     Discharge Orders   Future Appointments Provider Department Dept Phone   05/26/2013 9:45 AM [facility] [place] Office [phone]   Future Orders Complete By Expires   Call MD / Call 911  As directed    Comments:     If you experience chest pain or shortness of breath, CALL 911 and be transported to the hospital emergency room.  If you develope a fever above 101 F, pus (white drainage) or increased drainage or redness at the wound, or calf pain, call your surgeon's office.   Change dressing  As directed    Comments:     Change dressing on Saturday, then change the dressing daily with sterile 4 x 4 inch gauze dressing and apply TED hose.  You may clean the incision with alcohol prior to redressing.   Constipation Prevention  As directed    Comments:     Drink plenty of fluids.  Prune juice and/or coffee may be helpful.  You may use a stool  softener, such as Colace (over the counter) 100 mg twice a day.  Use MiraLax (over the counter) for constipation as needed but  this may take several days to work.  Mag Citrate --OR-- Milk of Magnesia may also be used but follow directions on the label.   CPM  As directed    Comments:     Continuous passive motion machine (CPM):      Use the CPM from 0 to 60 for 6-8 hours per day.      You may increase by 5-10 per day.  You may break it up into 2 or 3 sessions per day.      Use CPM for 3-4 weeks or until you are told to stop.   Diet general  As directed    Do not put a pillow under the knee. Place it under the heel.  As directed    Driving restrictions  As directed    Comments:     No driving for 6 weeks   Increase activity slowly as tolerated  As directed    Lifting restrictions  As directed    Comments:     No lifting for 6 weeks   Partial weight bearing  As directed    Comments:     50 % WEIGHT BEARING AS TAUGHT IN PHYSICAL THERAPY   Questions:     % Body Weight:     Laterality:     Extremity:     Patient may shower  As directed    Comments:     You may shower over the brown dressing.  Once the dressing is removed you may shower without a dressing once there is no drainage.  Do not wash over the wound.  If drainage remains, cover wound with plastic wrap and then shower.   TED hose  As directed    Comments:     Use stockings (TED hose) for 2 weeks on operative leg(s).  You may remove them at night for sleeping.      DISCHARGE MEDICATIONS:     Medication List    STOP taking these medications       aspirin 81 MG tablet     hydroxychloroquine 200 MG tablet  Commonly known as:  PLAQUENIL      TAKE these medications       acetaminophen 500 MG tablet  Commonly known as:  TYLENOL  Take 1,000 mg by mouth every 6 (six) hours as needed for pain.     DSS 100 MG Caps  Take 100 mg by mouth 2 (two) times daily.     fexofenadine-pseudoephedrine 180-240 MG per 24 hr tablet    Commonly known as:  ALLEGRA-D 24  Take 1 tablet by mouth daily.     FLUoxetine 20 MG tablet  Commonly known as:  PROZAC  Take 20 mg by mouth at bedtime.     folic acid 1 MG tablet  Commonly known as:  FOLVITE  Take 1 mg by mouth daily.     fosfomycin 3 G Pack  Commonly known as:  MONUROL  Take 3 g by mouth every other day.     metFORMIN 500 MG tablet  Commonly known as:  GLUCOPHAGE  500 mg at bedtime.     methocarbamol 500 MG tablet  Commonly known as:  ROBAXIN  Take 1 tablet (500 mg total) by mouth every 8 (eight) hours as needed.     methotrexate 2.5 MG tablet  Take 2.5 mg by mouth 2 (two) times a week.     nicotine 21 mg/24hr patch  Commonly known as:  NICODERM CQ - dosed in mg/24  hours  Place 1 patch onto the skin daily.     rivaroxaban 10 MG Tabs tablet  Commonly known as:  XARELTO  Take 1 tablet (10 mg total) by mouth daily before supper.     simvastatin 20 MG tablet  Commonly known as:  ZOCOR  Take 20 mg by mouth every evening.     traMADol 50 MG tablet  Commonly known as:  ULTRAM  Take 2 tablets (100 mg total) by mouth every 6 (six) hours as needed for pain.        FOLLOW UP VISIT:   Follow-up Information   Follow up with [name], MD On 05/19/2013.   Specialty:  Orthopedic Surgery   Contact information:   [address] [place] [address] [phone]       DISPOSITION:   Skilled Nursing Facility/Rehab  CONDITION:  Stable   Sharell Hilmer 05/08/2013, 12:36 PM

## 2013-05-09 ENCOUNTER — Encounter (HOSPITAL_COMMUNITY): Payer: Self-pay | Admitting: Orthopaedic Surgery

## 2013-05-09 LAB — CBC
HCT: 29.8 % — ABNORMAL LOW (ref 36.0–46.0)
Hemoglobin: 10.3 g/dL — ABNORMAL LOW (ref 12.0–15.0)
MCV: 94 fL (ref 78.0–100.0)
Platelets: 176 10*3/uL (ref 150–400)
RBC: 3.17 MIL/uL — ABNORMAL LOW (ref 3.87–5.11)
RDW: 14.9 % (ref 11.5–15.5)
WBC: 12.9 10*3/uL — ABNORMAL HIGH (ref 4.0–10.5)

## 2013-05-09 LAB — GLUCOSE, CAPILLARY
Glucose-Capillary: 164 mg/dL — ABNORMAL HIGH (ref 70–99)
Glucose-Capillary: 236 mg/dL — ABNORMAL HIGH (ref 70–99)

## 2013-05-09 LAB — BASIC METABOLIC PANEL
BUN: 15 mg/dL (ref 6–23)
CO2: 23 mEq/L (ref 19–32)
Chloride: 104 mEq/L (ref 96–112)
Creatinine, Ser: 1.09 mg/dL (ref 0.50–1.10)
GFR calc Af Amer: 59 mL/min — ABNORMAL LOW (ref >90)
Potassium: 3.5 mEq/L (ref 3.5–5.1)

## 2013-05-09 NOTE — Progress Notes (Signed)
Inpatient Diabetes Program Recommendations  AACE/ADA: New Consensus Statement on Inpatient Glycemic Control (2013)  Target Ranges:  Prepandial:   less than 140 mg/dL      Peak postprandial:   less than 180 mg/dL (1-2 hours)      Critically ill patients:  140 - 180 mg/dL   Hyperglycemia following meals If pt is not discharged today, please add the following:  Inpatient Diabetes Program Recommendations Correction (SSI): Increase correction to resistant tidwc and continue HS coverage  Thank you, Lenor Coffin, RN, CNS, Diabetes Coordinator [phone])

## 2013-05-09 NOTE — Progress Notes (Signed)
Physical Therapy Treatment Patient Details Name: Tracey Koch MRN: [id number] DOB: [birth year] Today's Date: 05/09/2013 Time: 4098-1191 PT Time Calculation (min): 35 min  PT Assessment / Plan / Recommendation  History of Present Illness Pt is a 67 y/o female s/p L TKA on 05/06/2013.    PT Comments   Pt to d/c to SNF this afternoon. Pt improving with rehab effort and ability to perform functional mobility. Therapeutic exercise was heavy active assist, and pt continues to struggle with ambulation due to pain and weakness.  Follow Up Recommendations  SNF     Does the patient have the potential to tolerate intense rehabilitation     Barriers to Discharge        Equipment Recommendations  None recommended by PT    Recommendations for Other Services    Frequency 7X/week   Progress towards PT Goals Progress towards PT goals: Progressing toward goals  Plan Current plan remains appropriate    Precautions / Restrictions Precautions Precautions: Fall;Knee Restrictions Weight Bearing Restrictions: Yes LLE Weight Bearing: Partial weight bearing LLE Partial Weight Bearing Percentage or Pounds: 50%   Pertinent Vitals/Pain 4/10    Mobility  Bed Mobility Bed Mobility: Supine to Sit;Sitting - Scoot to Edge of Bed Supine to Sit: 3: Mod assist;With rails;HOB elevated Supine to Sit: Patient Percentage: 50% Sitting - Scoot to Edge of Bed: 4: Min assist;With rail Details for Bed Mobility Assistance: VC's for sequencing and safety awareness during transfer to EOB. Pt able to perform more of transfer this session and did not require +2. Transfers Transfers: Sit to Stand;Stand to Sit Sit to Stand: 3: Mod assist;From bed;With upper extremity assist Sit to Stand: Patient Percentage: 50% Stand to Sit: 4: Min assist;To chair/3-in-1;With upper extremity assist Details for Transfer Assistance: VC's for L LE positioning and hand placement prior to initiating  transfers. Ambulation/Gait Ambulation/Gait Assistance: 4: Min assist Ambulation Distance (Feet): 8 Feet Assistive device: Rolling walker Ambulation/Gait Assistance Details: Continued difficulty maintaining 50% WB status during ambulation, however is improving with ability to use UE's on walker for support. Gait Pattern: Step-to pattern;Decreased stride length;Narrow base of support;Trunk flexed Gait velocity: decreased Stairs: No    Exercises Total Joint Exercises Ankle Circles/Pumps: 15 reps Quad Sets: 15 reps Towel Squeeze: 15 reps Short Arc Quad: 15 reps Heel Slides: 15 reps Hip ABduction/ADduction: 15 reps Straight Leg Raises: 15 reps Knee Flexion: 5 reps (20 second hold) Goniometric ROM: 80 AAROM   PT Diagnosis:    PT Problem List:   PT Treatment Interventions:     PT Goals (current goals can now be found in the care plan section) Acute Rehab PT Goals Patient Stated Goal: to return home after SNF PT Goal Formulation: With patient/family Time For Goal Achievement: 05/14/13 Potential to Achieve Goals: Good  Visit Information  Last PT Received On: 05/09/13 Assistance Needed: +2 History of Present Illness: Pt is a 67 y/o female s/p L TKA on 05/06/2013.     Subjective Data  Subjective: "I'm supposed to be leaving today." Patient Stated Goal: to return home after SNF   Cognition  Cognition Arousal/Alertness: Awake/alert Behavior During Therapy: WFL for tasks assessed/performed Overall Cognitive Status: Within Functional Limits for tasks assessed    Balance     End of Session PT - End of Session Equipment Utilized During Treatment: Gait belt Activity Tolerance: Patient limited by fatigue;Patient limited by pain Patient left: in chair;with call bell/phone within reach;with family/visitor present Nurse Communication: Mobility status   GP  Ruthann Cancer 05/09/2013, 3:03 PM  Ruthann Cancer, PT, DPT Acute Rehabilitation Services [phone]

## 2013-05-09 NOTE — Progress Notes (Signed)
Patient d/c to SNF this afternoon.  IV removed.  Report called to SNF.

## 2013-05-10 LAB — BODY FLUID CULTURE: Culture: NO GROWTH

## 2013-05-11 LAB — ANAEROBIC CULTURE

## 2013-05-26 ENCOUNTER — Encounter: Payer: Self-pay | Admitting: Internal Medicine

## 2013-05-26 ENCOUNTER — Ambulatory Visit (INDEPENDENT_AMBULATORY_CARE_PROVIDER_SITE_OTHER): Payer: Medicare Other | Admitting: *Deleted

## 2013-05-26 DIAGNOSIS — I495 Sick sinus syndrome: Secondary | ICD-10-CM

## 2013-05-26 DIAGNOSIS — Z950 Presence of cardiac pacemaker: Secondary | ICD-10-CM

## 2013-05-30 LAB — REMOTE PACEMAKER DEVICE
AL AMPLITUDE: 2.8 mv
AL IMPEDENCE PM: 608 Ohm
AL THRESHOLD: 0.5 V
BATTERY VOLTAGE: 2.72 V
RV LEAD AMPLITUDE: 22.4 mv
RV LEAD IMPEDENCE PM: 776 Ohm

## 2013-06-06 ENCOUNTER — Encounter: Payer: Self-pay | Admitting: *Deleted

## 2013-06-23 ENCOUNTER — Telehealth: Payer: Self-pay | Admitting: *Deleted

## 2013-06-23 NOTE — Telephone Encounter (Signed)
PT WANTS TO KNOW IF COMPUTERS ARE GOING TO BE INVOLVED IN PACEMAKER PLACEMENT.

## 2013-06-24 NOTE — Telephone Encounter (Signed)
Patient states she didn't call and doesn't have any questions.

## 2013-06-24 NOTE — Telephone Encounter (Signed)
Left message to call office back. Will send to [employer].

## 2013-08-07 ENCOUNTER — Ambulatory Visit (INDEPENDENT_AMBULATORY_CARE_PROVIDER_SITE_OTHER): Payer: Medicare Other | Admitting: Internal Medicine

## 2013-08-07 ENCOUNTER — Encounter: Payer: Self-pay | Admitting: Internal Medicine

## 2013-08-07 VITALS — BP 114/68 | HR 62 | Ht 65.0 in | Wt 196.0 lb

## 2013-08-07 DIAGNOSIS — I495 Sick sinus syndrome: Secondary | ICD-10-CM

## 2013-08-07 DIAGNOSIS — I10 Essential (primary) hypertension: Secondary | ICD-10-CM

## 2013-08-07 DIAGNOSIS — Z950 Presence of cardiac pacemaker: Secondary | ICD-10-CM

## 2013-08-07 LAB — MDC_IDC_ENUM_SESS_TYPE_INCLINIC
Battery Remaining Longevity: 25 mo
Lead Channel Pacing Threshold Amplitude: 0.5 V
Lead Channel Pacing Threshold Pulse Width: 0.4 ms
Lead Channel Sensing Intrinsic Amplitude: 31.36 mV
Lead Channel Setting Pacing Amplitude: 1.5 V
Lead Channel Setting Pacing Pulse Width: 0.4 ms
Lead Channel Setting Sensing Sensitivity: 5.6 mV
MDC IDC MSMT BATTERY IMPEDANCE: 2182 Ohm
MDC IDC MSMT BATTERY VOLTAGE: 2.71 V
MDC IDC MSMT LEADCHNL RA IMPEDANCE VALUE: 614 Ohm
MDC IDC MSMT LEADCHNL RA PACING THRESHOLD AMPLITUDE: 0.5 V
MDC IDC MSMT LEADCHNL RA PACING THRESHOLD PULSEWIDTH: 0.4 ms
MDC IDC MSMT LEADCHNL RA SENSING INTR AMPL: 4 mV
MDC IDC MSMT LEADCHNL RV IMPEDANCE VALUE: 673 Ohm
MDC IDC SESS DTM: 20150108094558
MDC IDC SET LEADCHNL RV PACING AMPLITUDE: 2 V
MDC IDC STAT BRADY AP VP PERCENT: 0 %
MDC IDC STAT BRADY AP VS PERCENT: 11 %
MDC IDC STAT BRADY AS VP PERCENT: 0 %
MDC IDC STAT BRADY AS VS PERCENT: 88 %

## 2013-08-07 NOTE — Patient Instructions (Addendum)
Your physician recommends that you schedule a follow-up appointment in: 1 year You will receive a reminder letter two months in advance reminding you to call and schedule your appointment. If you don't receive this letter, please contact our office.   [facility] on 11-10-13  

## 2013-08-07 NOTE — Progress Notes (Signed)
HPI [name] returns today for followup. She is a pleasant 68 yo woman with a h/o symptomatic bradycardia, s/p PPM, HTN, arthritis, and DM. In the interim, she has been stable. She is trying to stop smoking and is down to under a pack a day. No chest pain or sob. No syncope. She still has pain in her left knee after knee replacement 4 months ago. Her pain however is improved.  Allergies  Allergen Reactions  . Codeine Itching, Swelling and Rash  . Penicillins Itching, Swelling and Rash  . Sulfa Drugs Cross Reactors Itching, Swelling and Rash     Current Outpatient Prescriptions  Medication Sig Dispense Refill  . acetaminophen (TYLENOL) 500 MG tablet Take 1,000 mg by mouth every 6 (six) hours as needed for pain.      Tery Sanfilippo Sodium (DSS) 100 MG CAPS Take 100 mg by mouth as needed.      . fexofenadine-pseudoephedrine (ALLEGRA-D 24) 180-240 MG per 24 hr tablet Take 1 tablet by mouth daily.      . folic acid (FOLVITE) 1 MG tablet Take 1 mg by mouth daily.      . hydroxychloroquine (PLAQUENIL) 200 MG tablet Take 400 mg by mouth daily.      . metFORMIN (GLUCOPHAGE) 500 MG tablet 500 mg 2 (two) times daily with a meal.       . methotrexate 2.5 MG tablet Take 12.5 mg by mouth once a week.       Marland Kitchen PHENERGAN 25 MG suppository Place 25 mg rectally as needed.      . simvastatin (ZOCOR) 20 MG tablet Take 20 mg by mouth every evening.      . solifenacin (VESICARE) 5 MG tablet Take 5 mg by mouth daily.      . traMADol (ULTRAM) 50 MG tablet Take 2 tablets (100 mg total) by mouth every 6 (six) hours as needed for pain.  60 tablet  0   No current facility-administered medications for this visit.     Past Medical History  Diagnosis Date  . Hyperlipidemia     takes Simvastatin nightly  . Seasonal allergies     takes Allegra daily  . Depression     takes Prozac at bedtime  . Diabetes mellitus without complication     takes Metformin daily  . Sinoatrial node dysfunction     has ICD  . Anemia      takes Folic Acid daily  . Arthritis     takes Plaquenil daily and Methotrexate 2 times a week  . Family history of anesthesia complication     sister post op N/V  . Pacemaker   . Cough     takes Allegra daily  . Joint pain   . Joint swelling   . History of UTI   . History of blood transfusion     ROS:   All systems reviewed and negative except as noted in the HPI.   Past Surgical History  Procedure Laterality Date  . Abdominal hysterectomy    . Insert / replace / remove pacemaker    . Cholecystectomy    . Colonoscopy    . Right knee replacement    . Total knee arthroplasty Left 05/06/2013    Dr Cleophas Dunker  . Total knee arthroplasty Left 05/06/2013    Procedure: TOTAL KNEE ARTHROPLASTY;  Surgeon: [name], MD;  Location: [facility] OR;  Service: Orthopedics;  Laterality: Left;     No family history on file.   History  Social History  . Marital Status: Married    Spouse Name: N/A    Number of Children: N/A  . Years of Education: N/A   Occupational History  . Not on file.   Social History Main Topics  . Smoking status: Current Every Day Smoker -- 1.00 packs/day for 52 years    Types: Cigarettes  . Smokeless tobacco: Never Used  . Alcohol Use: No  . Drug Use: No  . Sexual Activity: Not Currently    Birth Control/ Protection: Surgical   Other Topics Concern  . Not on file   Social History Narrative  . No narrative on file     BP 114/68  Pulse 62  Ht 5\' 5"  (1.651 m)  Wt 196 lb (88.905 kg)  BMI 32.62 kg/m2  SpO2 95%  Physical Exam:  Well appearing 68 yo woman, NAD HEENT: Unremarkable Neck:  7 cm JVD, no thyromegally Lungs:  Clear with no wheezes, rales, or rhonchi HEART:  Regular rate rhythm, no murmurs, no rubs, no clicks Abd:  soft, positive bowel sounds, no organomegally, no rebound, no guarding Ext:  2 plus pulses, no edema, no cyanosis, no clubbing Skin:  No rashes no nodules Neuro:  CN II through XII intact, motor grossly  intact  DEVICE  Normal device function.  See PaceArt for details.   Assess/Plan:

## 2013-08-07 NOTE — Assessment & Plan Note (Signed)
Her Medtronic dual-chamber pacemaker is working normally. We'll plan to recheck in several months. 

## 2013-08-07 NOTE — Assessment & Plan Note (Signed)
Her blood pressure is well controlled today. She will continue her current medical therapy, and maintain a low-sodium diet.

## 2013-08-14 ENCOUNTER — Encounter: Payer: Self-pay | Admitting: Internal Medicine

## 2013-11-10 ENCOUNTER — Ambulatory Visit (INDEPENDENT_AMBULATORY_CARE_PROVIDER_SITE_OTHER): Payer: Medicare Other | Admitting: *Deleted

## 2013-11-10 DIAGNOSIS — I498 Other specified cardiac arrhythmias: Secondary | ICD-10-CM

## 2013-11-10 DIAGNOSIS — I495 Sick sinus syndrome: Secondary | ICD-10-CM

## 2013-11-10 LAB — MDC_IDC_ENUM_SESS_TYPE_REMOTE
Battery Impedance: 2246 Ohm
Battery Remaining Longevity: 25 mo
Brady Statistic AP VP Percent: 0 %
Brady Statistic AS VP Percent: 0 %
Brady Statistic AS VS Percent: 98 %
Date Time Interrogation Session: 20150413211454
Lead Channel Impedance Value: 763 Ohm
Lead Channel Pacing Threshold Amplitude: 0.5 V
Lead Channel Pacing Threshold Amplitude: 0.625 V
Lead Channel Pacing Threshold Pulse Width: 0.4 ms
Lead Channel Sensing Intrinsic Amplitude: 1.4 mV
Lead Channel Sensing Intrinsic Amplitude: 16 mV
MDC IDC MSMT BATTERY VOLTAGE: 2.71 V
MDC IDC MSMT LEADCHNL RA IMPEDANCE VALUE: 563 Ohm
MDC IDC MSMT LEADCHNL RV PACING THRESHOLD PULSEWIDTH: 0.4 ms
MDC IDC SET LEADCHNL RA PACING AMPLITUDE: 1.5 V
MDC IDC SET LEADCHNL RV PACING AMPLITUDE: 2 V
MDC IDC SET LEADCHNL RV PACING PULSEWIDTH: 0.4 ms
MDC IDC SET LEADCHNL RV SENSING SENSITIVITY: 5.6 mV
MDC IDC STAT BRADY AP VS PERCENT: 2 %

## 2013-11-25 ENCOUNTER — Encounter: Payer: Self-pay | Admitting: *Deleted

## 2013-12-05 ENCOUNTER — Encounter: Payer: Self-pay | Admitting: Internal Medicine

## 2014-02-11 ENCOUNTER — Encounter: Payer: Medicare Other | Admitting: *Deleted

## 2014-02-11 ENCOUNTER — Telehealth: Payer: Self-pay | Admitting: Cardiology

## 2014-02-11 NOTE — Telephone Encounter (Signed)
Called to confirm remote transmission for device for today. Pt husband stated that pt is in the hospital and she will send transmission when she gets out.

## 2014-02-16 ENCOUNTER — Ambulatory Visit (INDEPENDENT_AMBULATORY_CARE_PROVIDER_SITE_OTHER): Payer: Medicare Other | Admitting: *Deleted

## 2014-02-16 DIAGNOSIS — I495 Sick sinus syndrome: Secondary | ICD-10-CM

## 2014-02-16 DIAGNOSIS — I498 Other specified cardiac arrhythmias: Secondary | ICD-10-CM

## 2014-02-16 NOTE — Progress Notes (Signed)
Remote pacemaker transmission.   

## 2014-02-23 LAB — MDC_IDC_ENUM_SESS_TYPE_REMOTE
Battery Impedance: 2821 Ohm
Battery Remaining Longevity: 19 mo
Brady Statistic AP VP Percent: 0 %
Brady Statistic AS VS Percent: 96 %
Lead Channel Impedance Value: 687 Ohm
Lead Channel Pacing Threshold Amplitude: 0.75 V
Lead Channel Pacing Threshold Pulse Width: 0.4 ms
Lead Channel Pacing Threshold Pulse Width: 0.4 ms
Lead Channel Sensing Intrinsic Amplitude: 2.8 mV
Lead Channel Sensing Intrinsic Amplitude: 22.4 mV
Lead Channel Setting Pacing Amplitude: 1.5 V
Lead Channel Setting Pacing Amplitude: 2 V
Lead Channel Setting Pacing Pulse Width: 0.4 ms
Lead Channel Setting Sensing Sensitivity: 5.6 mV
MDC IDC MSMT BATTERY VOLTAGE: 2.7 V
MDC IDC MSMT LEADCHNL RA IMPEDANCE VALUE: 499 Ohm
MDC IDC MSMT LEADCHNL RA PACING THRESHOLD AMPLITUDE: 0.5 V
MDC IDC SESS DTM: 20150720124437
MDC IDC STAT BRADY AP VS PERCENT: 3 %
MDC IDC STAT BRADY AS VP PERCENT: 0 %

## 2014-03-06 ENCOUNTER — Encounter: Payer: Self-pay | Admitting: Cardiology

## 2014-03-13 ENCOUNTER — Emergency Department (HOSPITAL_COMMUNITY): Payer: Medicare Other

## 2014-03-13 ENCOUNTER — Encounter: Payer: Self-pay | Admitting: Internal Medicine

## 2014-03-13 ENCOUNTER — Inpatient Hospital Stay (HOSPITAL_COMMUNITY)
Admission: EM | Admit: 2014-03-13 | Discharge: 2014-03-17 | DRG: 872 | Disposition: A | Discharge status: Home Health | Payer: Medicare Other | Attending: Internal Medicine | Admitting: Internal Medicine

## 2014-03-13 ENCOUNTER — Encounter (HOSPITAL_COMMUNITY): Payer: Self-pay | Admitting: Emergency Medicine

## 2014-03-13 DIAGNOSIS — Z87440 Personal history of urinary (tract) infections: Secondary | ICD-10-CM | POA: Diagnosis not present

## 2014-03-13 DIAGNOSIS — N390 Urinary tract infection, site not specified: Secondary | ICD-10-CM | POA: Diagnosis present

## 2014-03-13 DIAGNOSIS — E119 Type 2 diabetes mellitus without complications: Secondary | ICD-10-CM | POA: Diagnosis present

## 2014-03-13 DIAGNOSIS — M129 Arthropathy, unspecified: Secondary | ICD-10-CM | POA: Diagnosis present

## 2014-03-13 DIAGNOSIS — Z87891 Personal history of nicotine dependence: Secondary | ICD-10-CM | POA: Diagnosis not present

## 2014-03-13 DIAGNOSIS — Z79899 Other long term (current) drug therapy: Secondary | ICD-10-CM

## 2014-03-13 DIAGNOSIS — N3000 Acute cystitis without hematuria: Secondary | ICD-10-CM

## 2014-03-13 DIAGNOSIS — A419 Sepsis, unspecified organism: Principal | ICD-10-CM

## 2014-03-13 DIAGNOSIS — I10 Essential (primary) hypertension: Secondary | ICD-10-CM | POA: Diagnosis present

## 2014-03-13 DIAGNOSIS — E785 Hyperlipidemia, unspecified: Secondary | ICD-10-CM | POA: Diagnosis present

## 2014-03-13 DIAGNOSIS — Z96659 Presence of unspecified artificial knee joint: Secondary | ICD-10-CM | POA: Diagnosis not present

## 2014-03-13 DIAGNOSIS — E1165 Type 2 diabetes mellitus with hyperglycemia: Secondary | ICD-10-CM

## 2014-03-13 DIAGNOSIS — Z950 Presence of cardiac pacemaker: Secondary | ICD-10-CM

## 2014-03-13 DIAGNOSIS — M069 Rheumatoid arthritis, unspecified: Secondary | ICD-10-CM | POA: Diagnosis present

## 2014-03-13 DIAGNOSIS — R509 Fever, unspecified: Secondary | ICD-10-CM | POA: Diagnosis present

## 2014-03-13 DIAGNOSIS — A4151 Sepsis due to Escherichia coli [E. coli]: Secondary | ICD-10-CM

## 2014-03-13 DIAGNOSIS — I495 Sick sinus syndrome: Secondary | ICD-10-CM

## 2014-03-13 LAB — CBC WITH DIFFERENTIAL/PLATELET
BASOS PCT: 0 % (ref 0–1)
Basophils Absolute: 0 10*3/uL (ref 0.0–0.1)
EOS ABS: 0.2 10*3/uL (ref 0.0–0.7)
EOS PCT: 2 % (ref 0–5)
HCT: 35.7 % — ABNORMAL LOW (ref 36.0–46.0)
Hemoglobin: 12.3 g/dL (ref 12.0–15.0)
LYMPHS ABS: 0.6 10*3/uL — AB (ref 0.7–4.0)
Lymphocytes Relative: 5 % — ABNORMAL LOW (ref 12–46)
MCH: 31.6 pg (ref 26.0–34.0)
MCHC: 34.5 g/dL (ref 30.0–36.0)
MCV: 91.8 fL (ref 78.0–100.0)
Monocytes Absolute: 1.4 10*3/uL — ABNORMAL HIGH (ref 0.1–1.0)
Monocytes Relative: 11 % (ref 3–12)
Neutro Abs: 10.1 10*3/uL — ABNORMAL HIGH (ref 1.7–7.7)
Neutrophils Relative %: 82 % — ABNORMAL HIGH (ref 43–77)
Platelets: 136 10*3/uL — ABNORMAL LOW (ref 150–400)
RBC: 3.89 MIL/uL (ref 3.87–5.11)
RDW: 16.5 % — ABNORMAL HIGH (ref 11.5–15.5)
WBC: 12.3 10*3/uL — ABNORMAL HIGH (ref 4.0–10.5)

## 2014-03-13 LAB — URINALYSIS, ROUTINE W REFLEX MICROSCOPIC
Bilirubin Urine: NEGATIVE
GLUCOSE, UA: NEGATIVE mg/dL
Ketones, ur: NEGATIVE mg/dL
Nitrite: POSITIVE — AB
PROTEIN: 30 mg/dL — AB
Specific Gravity, Urine: <1.005 — ABNORMAL LOW (ref 1.005–1.030)
UROBILINOGEN UA: 1 mg/dL (ref 0.0–1.0)
pH: 7 (ref 5.0–8.0)

## 2014-03-13 LAB — COMPREHENSIVE METABOLIC PANEL
ALT: 17 U/L (ref 0–35)
AST: 25 U/L (ref 0–37)
Albumin: 2.6 g/dL — ABNORMAL LOW (ref 3.5–5.2)
Alkaline Phosphatase: 143 U/L — ABNORMAL HIGH (ref 39–117)
Anion gap: 14 (ref 5–15)
BUN: 17 mg/dL (ref 6–23)
CALCIUM: 8 mg/dL — AB (ref 8.4–10.5)
CO2: 20 mEq/L (ref 19–32)
Chloride: 102 mEq/L (ref 96–112)
Creatinine, Ser: 1.17 mg/dL — ABNORMAL HIGH (ref 0.50–1.10)
GFR calc non Af Amer: 46 mL/min — ABNORMAL LOW (ref >90)
GFR, EST AFRICAN AMERICAN: 54 mL/min — AB (ref >90)
GLUCOSE: 118 mg/dL — AB (ref 70–99)
Potassium: 3.7 mEq/L (ref 3.7–5.3)
SODIUM: 136 meq/L — AB (ref 137–147)
TOTAL PROTEIN: 6.3 g/dL (ref 6.0–8.3)
Total Bilirubin: 1 mg/dL (ref 0.3–1.2)

## 2014-03-13 LAB — GLUCOSE, CAPILLARY: Glucose-Capillary: 118 mg/dL — ABNORMAL HIGH (ref 70–99)

## 2014-03-13 LAB — PROTIME-INR
INR: 1.37 (ref 0.00–1.49)
Prothrombin Time: 16.9 seconds — ABNORMAL HIGH (ref 11.6–15.2)

## 2014-03-13 LAB — URINE MICROSCOPIC-ADD ON

## 2014-03-13 LAB — LIPASE, BLOOD: Lipase: 25 U/L (ref 11–59)

## 2014-03-13 LAB — TROPONIN I

## 2014-03-13 LAB — I-STAT CG4 LACTIC ACID, ED: Lactic Acid, Venous: 1.51 mmol/L (ref 0.5–2.2)

## 2014-03-13 MED ORDER — INSULIN ASPART 100 UNIT/ML ~~LOC~~ SOLN
0.0000 [IU] | Freq: Three times a day (TID) | SUBCUTANEOUS | Status: DC
  Administered 2014-03-14 – 2014-03-15 (×2): 3 [IU] via SUBCUTANEOUS
  Administered 2014-03-15: 4 [IU] via SUBCUTANEOUS
  Administered 2014-03-16: 3 [IU] via SUBCUTANEOUS

## 2014-03-13 MED ORDER — SODIUM CHLORIDE 0.9 % IV SOLN
1000.0000 mL | INTRAVENOUS | Status: DC
  Administered 2014-03-13: 1000 mL via INTRAVENOUS

## 2014-03-13 MED ORDER — DEXTROSE 5 % IV SOLN
2.0000 g | Freq: Once | INTRAVENOUS | Status: AC
  Administered 2014-03-13: 2 g via INTRAVENOUS
  Filled 2014-03-13: qty 2

## 2014-03-13 MED ORDER — DEXTROSE 5 % IV SOLN
INTRAVENOUS | Status: AC
  Filled 2014-03-13: qty 2

## 2014-03-13 MED ORDER — INSULIN ASPART 100 UNIT/ML ~~LOC~~ SOLN: 0.0000 [IU] | Freq: Every day | SUBCUTANEOUS | Status: DC

## 2014-03-13 MED ORDER — VANCOMYCIN HCL 10 G IV SOLR
1500.0000 mg | Freq: Once | INTRAVENOUS | Status: AC
  Administered 2014-03-13: 1500 mg via INTRAVENOUS
  Filled 2014-03-13: qty 1500

## 2014-03-13 MED ORDER — HEPARIN SODIUM (PORCINE) 5000 UNIT/ML IJ SOLN
5000.0000 [IU] | Freq: Three times a day (TID) | INTRAMUSCULAR | Status: DC
  Administered 2014-03-13 – 2014-03-16 (×10): 5000 [IU] via SUBCUTANEOUS
  Filled 2014-03-13 (×11): qty 1

## 2014-03-13 MED ORDER — ONDANSETRON HCL 4 MG PO TABS: 4.0000 mg | ORAL_TABLET | Freq: Four times a day (QID) | ORAL | Status: DC | PRN

## 2014-03-13 MED ORDER — TRAMADOL HCL 50 MG PO TABS
100.0000 mg | ORAL_TABLET | Freq: Four times a day (QID) | ORAL | Status: DC | PRN
  Administered 2014-03-14 – 2014-03-17 (×4): 100 mg via ORAL
  Filled 2014-03-13 (×4): qty 2

## 2014-03-13 MED ORDER — LEVOFLOXACIN IN D5W 750 MG/150ML IV SOLN: 750.0000 mg | INTRAVENOUS | Status: DC

## 2014-03-13 MED ORDER — ONDANSETRON HCL 4 MG/2ML IJ SOLN
4.0000 mg | Freq: Four times a day (QID) | INTRAMUSCULAR | Status: DC | PRN
  Administered 2014-03-16: 4 mg via INTRAVENOUS
  Filled 2014-03-13: qty 2

## 2014-03-13 MED ORDER — DARIFENACIN HYDROBROMIDE ER 7.5 MG PO TB24
7.5000 mg | ORAL_TABLET | Freq: Every day | ORAL | Status: DC
  Administered 2014-03-13 – 2014-03-17 (×5): 7.5 mg via ORAL
  Filled 2014-03-13 (×5): qty 1

## 2014-03-13 MED ORDER — DEXTROSE 5 % IV SOLN
1.0000 g | Freq: Three times a day (TID) | INTRAVENOUS | Status: DC
  Administered 2014-03-14 – 2014-03-16 (×8): 1 g via INTRAVENOUS
  Filled 2014-03-13 (×11): qty 1

## 2014-03-13 MED ORDER — LEVOFLOXACIN IN D5W 750 MG/150ML IV SOLN
750.0000 mg | Freq: Once | INTRAVENOUS | Status: DC
Start: 2014-03-13 — End: 2014-03-13
  Filled 2014-03-13: qty 150

## 2014-03-13 MED ORDER — SIMVASTATIN 20 MG PO TABS
20.0000 mg | ORAL_TABLET | Freq: Every evening | ORAL | Status: DC
  Administered 2014-03-13 – 2014-03-16 (×4): 20 mg via ORAL
  Filled 2014-03-13 (×4): qty 1

## 2014-03-13 MED ORDER — METFORMIN HCL 500 MG PO TABS
500.0000 mg | ORAL_TABLET | Freq: Two times a day (BID) | ORAL | Status: DC
  Administered 2014-03-14 – 2014-03-17 (×7): 500 mg via ORAL
  Filled 2014-03-13 (×7): qty 1

## 2014-03-13 MED ORDER — HYDROXYCHLOROQUINE SULFATE 200 MG PO TABS
ORAL_TABLET | ORAL | Status: AC
  Filled 2014-03-13: qty 2

## 2014-03-13 MED ORDER — LORATADINE 10 MG PO TABS
10.0000 mg | ORAL_TABLET | Freq: Every day | ORAL | Status: DC
  Administered 2014-03-13 – 2014-03-17 (×5): 10 mg via ORAL
  Filled 2014-03-13 (×5): qty 1

## 2014-03-13 MED ORDER — AZTREONAM 1 G IJ SOLR
INTRAMUSCULAR | Status: AC
  Filled 2014-03-13: qty 1

## 2014-03-13 MED ORDER — VANCOMYCIN HCL IN DEXTROSE 1-5 GM/200ML-% IV SOLN
INTRAVENOUS | Status: AC
Start: 2014-03-13 — End: 2014-03-13
  Filled 2014-03-13: qty 200

## 2014-03-13 MED ORDER — SODIUM CHLORIDE 0.9 % IV BOLUS (SEPSIS)
30.0000 mL/kg | Freq: Once | INTRAVENOUS | Status: AC
  Administered 2014-03-13: 2721 mL via INTRAVENOUS

## 2014-03-13 MED ORDER — HYDROXYCHLOROQUINE SULFATE 200 MG PO TABS
400.0000 mg | ORAL_TABLET | Freq: Every day | ORAL | Status: DC
  Administered 2014-03-13 – 2014-03-17 (×5): 400 mg via ORAL
  Filled 2014-03-13 (×6): qty 2

## 2014-03-13 MED ORDER — VANCOMYCIN HCL IN DEXTROSE 1-5 GM/200ML-% IV SOLN
1000.0000 mg | Freq: Two times a day (BID) | INTRAVENOUS | Status: DC
  Administered 2014-03-14: 1000 mg via INTRAVENOUS
  Filled 2014-03-13 (×5): qty 200

## 2014-03-13 MED ORDER — ACETAMINOPHEN 500 MG PO TABS
1000.0000 mg | ORAL_TABLET | Freq: Four times a day (QID) | ORAL | Status: DC | PRN
  Administered 2014-03-13 – 2014-03-15 (×2): 1000 mg via ORAL
  Filled 2014-03-13 (×2): qty 2

## 2014-03-13 MED ORDER — VANCOMYCIN HCL IN DEXTROSE 1-5 GM/200ML-% IV SOLN
1000.0000 mg | Freq: Once | INTRAVENOUS | Status: DC
Start: 2014-03-13 — End: 2014-03-13

## 2014-03-13 NOTE — H&P (Signed)
[facility] History and Physical  Aileth Foo EYC:[id number] DOB: [birth year] DOA: 03/13/2014  Referring physician: ER. PCP: Louie Boston, MD   Chief Complaint: Fever, rigors, nausea and vomiting.  HPI: Tracey Koch is a 68 y.o. female  This is a 68 year old lady who gives a 3 week history of dysuria, fever, nausea vomiting and rigors  associated with a degree of confusion. She also has had lower abdominal pain and dysuria. She was seen at [facility] on 2 occasions. On the first occasion she was kept in the hospital for 3 days and on the second occasion she was discharged from the emergency room. She appears to have not improved despite this therapy. Today she once again had a fever and weakness. Evaluation in the emergency room shows urinalysis consistent with UTI and her clinical picture appears to be one of urosepsis. She is now being admitted for further management.   Review of Systems:  Constitutional:  No weight loss, night sweats  HEENT:  No headaches, Difficulty swallowing,Tooth/dental problems,Sore throat,  No sneezing, itching, ear ache, nasal congestion, post nasal drip,  Cardio-vascular:  No chest pain, Orthopnea, PND, swelling in lower extremities, anasarca, dizziness, palpitations  GI:  No heartburn, indigestion, abdominal pain, nausea, vomiting, diarrhea, change in bowel habits, loss of appetite  Resp:  No shortness of breath with exertion or at rest. No excess mucus, no productive cough, No non-productive cough, No coughing up of blood.No change in color of mucus.No wheezing.No chest wall deformity  Skin:  no rash or lesions.   Musculoskeletal:  No joint pain or swelling. No decreased range of motion. No back pain.  Psych:  No change in mood or affect. No depression or anxiety. No memory loss.   Past Medical History  Diagnosis Date  . Hyperlipidemia     takes Simvastatin nightly  . Seasonal allergies     takes Allegra daily  . Depression     takes Prozac at bedtime  . Diabetes mellitus without complication     takes Metformin daily  . Sinoatrial node dysfunction     has ICD  . Anemia     takes Folic Acid daily  . Arthritis     takes Plaquenil daily and Methotrexate 2 times a week  . Family history of anesthesia complication     sister post op N/V  . Pacemaker   . Cough     takes Allegra daily  . Joint pain   . Joint swelling   . History of UTI   . History of blood transfusion    Past Surgical History  Procedure Laterality Date  . Abdominal hysterectomy    . Insert / replace / remove pacemaker    . Cholecystectomy    . Colonoscopy    . Right knee replacement    . Total knee arthroplasty Left 05/06/2013    Dr Cleophas Dunker  . Total knee arthroplasty Left 05/06/2013    Procedure: TOTAL KNEE ARTHROPLASTY;  Surgeon: [name], MD;  Location: [facility] OR;  Service: Orthopedics;  Laterality: Left;   Social History:  reports that she quit smoking about 2 weeks ago. Her smoking use included Cigarettes. She has a 52 pack-year smoking history. She has never used smokeless tobacco. She reports that she does not drink alcohol or use illicit drugs.  Allergies  Allergen Reactions  . Codeine Itching, Swelling and Rash  . Levaquin [Levofloxacin In D5w] Itching, Swelling and Rash  . Penicillins Itching, Swelling and Rash  . Sulfa Drugs Cross  Reactors Itching, Swelling and Rash    No family history on file.   Prior to Admission medications   Medication Sig Start Date End Date Taking? Authorizing Provider  acetaminophen (TYLENOL) 500 MG tablet Take 1,000 mg by mouth every 6 (six) hours as needed for mild pain, moderate pain or fever.    Yes Historical Provider, MD  fexofenadine-pseudoephedrine (ALLEGRA-D 24) 180-240 MG per 24 hr tablet Take 1 tablet by mouth daily.   Yes Historical Provider, MD  hydroxychloroquine (PLAQUENIL) 200 MG tablet Take 400 mg by mouth daily.   Yes Historical Provider, MD  metFORMIN (GLUCOPHAGE) 500 MG  tablet Take 500 mg by mouth 2 (two) times daily with a meal.  07/09/12  Yes Historical Provider, MD  simvastatin (ZOCOR) 20 MG tablet Take 20 mg by mouth every evening.   Yes Historical Provider, MD  solifenacin (VESICARE) 5 MG tablet Take 5 mg by mouth daily.   Yes Historical Provider, MD  traMADol (ULTRAM) 50 MG tablet Take 2 tablets (100 mg total) by mouth every 6 (six) hours as needed for pain. 05/08/13  Yes Jacqualine Code, PA-C  cephALEXin (KEFLEX) 500 MG capsule Take 500 mg by mouth 3 (three) times daily. 5 day course starting on 7/30 per pharmacy records    Historical Provider, MD  ciprofloxacin (CIPRO) 500 MG tablet Take 500 mg by mouth 2 (two) times daily. 5 day course starting on 02/25/2014 per pharmacy records    Historical Provider, MD   Physical Exam: Filed Vitals:   03/13/14 1930 03/13/14 1945 03/13/14 2000 03/13/14 2015  BP: 107/74 107/72  118/71  Pulse: 84 84    Temp:      TempSrc:      Resp: 19 15  16   Height:      Weight:      SpO2: 99% 100% 98%     Wt Readings from Last 3 Encounters:  03/13/14 90.719 kg (200 lb)  08/07/13 88.905 kg (196 lb)  05/01/13 87.091 kg (192 lb)    General:  Appears slightly tremulous as if she is having rigors. Eyes: PERRL, normal lids, irises & conjunctiva ENT: grossly normal hearing, lips & tongue Neck: no LAD, masses or thyromegaly Cardiovascular: RRR, no m/r/g. No LE edema. Telemetry: SR, no arrhythmias  Respiratory: Mild scattered wheezing bilaterally consistent with long-standing history of smoking and probable COPD. Abdomen: soft, ntnd Skin: no rash or induration seen on limited exam Musculoskeletal: grossly normal tone BUE/BLE Psychiatric: grossly normal mood and affect, speech fluent and appropriate Neurologic: grossly non-focal.          Labs on Admission:  Basic Metabolic Panel:  Recent Labs Lab 03/13/14 1749  NA 136*  K 3.7  CL 102  CO2 20  GLUCOSE 118*  BUN 17  CREATININE 1.17*  CALCIUM 8.0*   Liver  Function Tests:  Recent Labs Lab 03/13/14 1749  AST 25  ALT 17  ALKPHOS 143*  BILITOT 1.0  PROT 6.3  ALBUMIN 2.6*    Recent Labs Lab 03/13/14 1749  LIPASE 25   No results found for this basename: AMMONIA,  in the last 168 hours CBC:  Recent Labs Lab 03/13/14 1749  WBC 12.3*  NEUTROABS 10.1*  HGB 12.3  HCT 35.7*  MCV 91.8  PLT 136*   Cardiac Enzymes:  Recent Labs Lab 03/13/14 1749  TROPONINI <0.30    BNP (last 3 results) No results found for this basename: PROBNP,  in the last 8760 hours CBG: No results found for this basename:  GLUCAP,  in the last 168 hours  Radiological Exams on Admission: Dg Chest 1 View  03/13/2014   CLINICAL DATA:  Fever and headache.  EXAM: CHEST - 1 VIEW  COMPARISON:  Single view of the chest 03/11/2014.  FINDINGS: Lungs are clear. Heart size is normal. No pneumothorax or pleural fluid. Pacing device again seen.  IMPRESSION: No acute disease.   Electronically Signed   By: Drusilla Kanner M.D.   On: 03/13/2014 18:32   Ct Head Wo Contrast  03/13/2014   CLINICAL DATA:  Fever, headache and confusion.  EXAM: CT HEAD WITHOUT CONTRAST  TECHNIQUE: Contiguous axial images were obtained from the base of the skull through the vertex without intravenous contrast.  COMPARISON:  10/30/2011.  FINDINGS: No evidence of an acute infarct, acute hemorrhage, mass lesion, mass effect or hydrocephalus. Mild atrophy. Mucosal thickening is seen in the ethmoid air cells. No air-fluid levels. Mastoid air cells are clear.  IMPRESSION: 1. No acute intracranial abnormality. 2. Mild atrophy. 3. Mucosal thickening in the ethmoid air cells.   Electronically Signed   By: Leanna Battles M.D.   On: 03/13/2014 18:32      Assessment/Plan   1. Sepsis secondary to UTI. 2. UTI. 3. Status post pacemaker for bradycardia. 4. Hypertension. 5. History of probable rheumatoid arthritis. 6. Diabetes mellitus.  Plan: 1. Admit to medical floor. 2. Intravenous antibiotics. We  will choose a combination of vancomycin and aztreonam as she appears to be allergic to several other antibiotics. 3. Intravenous fluids. 4. Monitor and control diabetes closely.  Further recommendations will depend on patient's hospital progress.  Code Status: Full code.  DVT Prophylaxis: Heparin.  Family Communication: I discussed the plan with patient at the bedside.   Disposition Plan: Home when medically stable.   Time spent: 60 minutes.  Wilson Singer [facility] Pager [phone].  **Disclaimer: This note may have been dictated with voice recognition software. Similar sounding words can inadvertently be transcribed and this note may contain transcription errors which may not have been corrected upon publication of note.**

## 2014-03-13 NOTE — ED Notes (Signed)
PT was dx with kidney infection on 03/11/14 and husband states she was unable to take the antibiotics d/t it made her itch. PT fever at home was 102 and pt took 2 tylenol prior to arrival. PT c/o headache with nausea and vomiting x2 times today.

## 2014-03-13 NOTE — ED Provider Notes (Signed)
CSN: [id number]     Arrival date & time 03/13/14  1722 History   First MD Initiated Contact with Patient 03/13/14 1726     Chief Complaint  Patient presents with  . Fever     (Consider location/radiation/quality/duration/timing/severity/associated sxs/prior Treatment) HPI Comments: Patient presents to the ER for evaluation of fever and weakness. Patient has reportedly been ill for one week. She was seen at [facility] 3 days ago and diagnosed with urinary tract infection. She was given an antibiotic in the ER and discharged with a prescription. She reports that she began to have itching when she got, determined that it was secondary to the antibiotic and has not taken any more of the medicine. Since then she has progressively worsened. Patient is complaining of moderate to severe global headache, nausea, vomiting, diarrhea. She is not evidencing any chest pain, cough or shortness of breath.   The patient developed a fever to 102 at home. She took Tylenol prior to coming to the ER.     Patient is a 68 y.o. female presenting with fever.  Fever Associated symptoms: diarrhea, headaches, nausea and vomiting     Past Medical History  Diagnosis Date  . Hyperlipidemia     takes Simvastatin nightly  . Seasonal allergies     takes Allegra daily  . Depression     takes Prozac at bedtime  . Diabetes mellitus without complication     takes Metformin daily  . Sinoatrial node dysfunction     has ICD  . Anemia     takes Folic Acid daily  . Arthritis     takes Plaquenil daily and Methotrexate 2 times a week  . Family history of anesthesia complication     sister post op N/V  . Pacemaker   . Cough     takes Allegra daily  . Joint pain   . Joint swelling   . History of UTI   . History of blood transfusion    Past Surgical History  Procedure Laterality Date  . Abdominal hysterectomy    . Insert / replace / remove pacemaker    . Cholecystectomy    . Colonoscopy    .  Right knee replacement    . Total knee arthroplasty Left 05/06/2013    Dr Cleophas Dunker  . Total knee arthroplasty Left 05/06/2013    Procedure: TOTAL KNEE ARTHROPLASTY;  Surgeon: [name], MD;  Location: [facility] OR;  Service: Orthopedics;  Laterality: Left;   No family history on file. History  Substance Use Topics  . Smoking status: Former Smoker -- 1.00 packs/day for 52 years    Types: Cigarettes    Quit date: 02/27/2014  . Smokeless tobacco: Never Used  . Alcohol Use: No   OB History   Grav Para Term Preterm Abortions TAB SAB Ect Mult Living                 Review of Systems  Constitutional: Positive for fever.  Respiratory: Negative for shortness of breath.   Gastrointestinal: Positive for nausea, vomiting and diarrhea.  Neurological: Positive for headaches.  All other systems reviewed and are negative.     Allergies  Codeine; Levaquin; Penicillins; and Sulfa drugs cross reactors  Home Medications   Prior to Admission medications   Medication Sig Start Date End Date Taking? Authorizing Provider  acetaminophen (TYLENOL) 500 MG tablet Take 1,000 mg by mouth every 6 (six) hours as needed for mild pain, moderate pain or fever.  Yes Historical Provider, MD  fexofenadine-pseudoephedrine (ALLEGRA-D 24) 180-240 MG per 24 hr tablet Take 1 tablet by mouth daily.   Yes Historical Provider, MD  hydroxychloroquine (PLAQUENIL) 200 MG tablet Take 400 mg by mouth daily.   Yes Historical Provider, MD  metFORMIN (GLUCOPHAGE) 500 MG tablet Take 500 mg by mouth 2 (two) times daily with a meal.  07/09/12  Yes Historical Provider, MD  simvastatin (ZOCOR) 20 MG tablet Take 20 mg by mouth every evening.   Yes Historical Provider, MD  solifenacin (VESICARE) 5 MG tablet Take 5 mg by mouth daily.   Yes Historical Provider, MD  traMADol (ULTRAM) 50 MG tablet Take 2 tablets (100 mg total) by mouth every 6 (six) hours as needed for pain. 05/08/13  Yes Jacqualine Code, PA-C  cephALEXin (KEFLEX) 500  MG capsule Take 500 mg by mouth 3 (three) times daily. 5 day course starting on 7/30 per pharmacy records    Historical Provider, MD  ciprofloxacin (CIPRO) 500 MG tablet Take 500 mg by mouth 2 (two) times daily. 5 day course starting on 02/25/2014 per pharmacy records    Historical Provider, MD   BP 118/71  Pulse 84  Temp(Src) 102.8 F (39.3 C) (Rectal)  Resp 16  Ht 5\' 6"  (1.676 m)  Wt 200 lb (90.719 kg)  BMI 32.30 kg/m2  SpO2 98% Physical Exam  Constitutional: She is oriented to person, place, and time. She appears well-developed and well-nourished. No distress.  HENT:  Head: Normocephalic and atraumatic.  Right Ear: Hearing normal.  Left Ear: Hearing normal.  Nose: Nose normal.  Mouth/Throat: Oropharynx is clear and moist and mucous membranes are normal.  Eyes: Conjunctivae and EOM are normal. Pupils are equal, round, and reactive to light.  Neck: Normal range of motion. Neck supple.  Cardiovascular: Regular rhythm, S1 normal and S2 normal.  Tachycardia present.  Exam reveals no gallop and no friction rub.   No murmur heard. Pulmonary/Chest: Effort normal and breath sounds normal. No respiratory distress. She exhibits no tenderness.  Abdominal: Soft. Normal appearance and bowel sounds are normal. There is no hepatosplenomegaly. There is no tenderness. There is no rebound, no guarding, no tenderness at McBurney's point and negative Murphy's sign. No hernia.  Musculoskeletal: Normal range of motion.  Neurological: She is alert and oriented to person, place, and time. She has normal strength. No cranial nerve deficit or sensory deficit. Coordination normal. GCS eye subscore is 4. GCS verbal subscore is 5. GCS motor subscore is 6.  Patient oriented, but slow to respond and does appear slightly confused  Skin: Skin is warm, dry and intact. No rash noted. No cyanosis.  Psychiatric: She has a normal mood and affect. Her speech is normal and behavior is normal. Thought content normal.    ED  Course  Procedures (including critical care time) Labs Review Labs Reviewed  CBC WITH DIFFERENTIAL - Abnormal; Notable for the following:    WBC 12.3 (*)    HCT 35.7 (*)    RDW 16.5 (*)    Platelets 136 (*)    Neutrophils Relative % 82 (*)    Neutro Abs 10.1 (*)    Lymphocytes Relative 5 (*)    Lymphs Abs 0.6 (*)    Monocytes Absolute 1.4 (*)    All other components within normal limits  COMPREHENSIVE METABOLIC PANEL - Abnormal; Notable for the following:    Sodium 136 (*)    Glucose, Bld 118 (*)    Creatinine, Ser 1.17 (*)  Calcium 8.0 (*)    Albumin 2.6 (*)    Alkaline Phosphatase 143 (*)    GFR calc non Af Amer 46 (*)    GFR calc Af Amer 54 (*)    All other components within normal limits  URINALYSIS, ROUTINE W REFLEX MICROSCOPIC - Abnormal; Notable for the following:    Specific Gravity, Urine <1.005 (*)    Hgb urine dipstick LARGE (*)    Protein, ur 30 (*)    Nitrite POSITIVE (*)    Leukocytes, UA SMALL (*)    All other components within normal limits  PROTIME-INR - Abnormal; Notable for the following:    Prothrombin Time 16.9 (*)    All other components within normal limits  URINE MICROSCOPIC-ADD ON - Abnormal; Notable for the following:    Bacteria, UA MANY (*)    All other components within normal limits  CULTURE, BLOOD (ROUTINE X 2)  CULTURE, BLOOD (ROUTINE X 2)  URINE CULTURE  TROPONIN I  LIPASE, BLOOD  I-STAT CG4 LACTIC ACID, ED    Imaging Review Dg Chest 1 View  03/13/2014   CLINICAL DATA:  Fever and headache.  EXAM: CHEST - 1 VIEW  COMPARISON:  Single view of the chest 03/11/2014.  FINDINGS: Lungs are clear. Heart size is normal. No pneumothorax or pleural fluid. Pacing device again seen.  IMPRESSION: No acute disease.   Electronically Signed   By: Drusilla Kanner M.D.   On: 03/13/2014 18:32   Ct Head Wo Contrast  03/13/2014   CLINICAL DATA:  Fever, headache and confusion.  EXAM: CT HEAD WITHOUT CONTRAST  TECHNIQUE: Contiguous axial images were  obtained from the base of the skull through the vertex without intravenous contrast.  COMPARISON:  10/30/2011.  FINDINGS: No evidence of an acute infarct, acute hemorrhage, mass lesion, mass effect or hydrocephalus. Mild atrophy. Mucosal thickening is seen in the ethmoid air cells. No air-fluid levels. Mastoid air cells are clear.  IMPRESSION: 1. No acute intracranial abnormality. 2. Mild atrophy. 3. Mucosal thickening in the ethmoid air cells.   Electronically Signed   By: Leanna Battles M.D.   On: 03/13/2014 18:32     EKG Interpretation None      MDM   Final diagnoses:  Sepsis, due to unspecified organism  UTI (lower urinary tract infection)   The patient presented to the ER for evaluation of fever. She has been sick for more than a week. Patient was seen 3 days ago at [facility] and diagnosed with urinary tract infection. She was administered Levaquin in the ER and discharged. Patient reports that after she got home she developed itching and a rash, has discontinued the Levaquin has not started any other antibiotics. Since then she has been running fever over 102. Husband reports nausea, vomiting last night. She has had diarrhea today. There has been some headache and confusion. She denies any obvious meningismus on arrival. She was tachycardic with a fever, sepsis was considered. Empiric antibiotic coverage was administered. The patient was not clear initially what antibiotic she got at [facility]. Empiric coverage with a history and, Levaquin, vancomycin. With the nurse prepped Levaquin to, she recognized it as the antibiotic that she was allergic to. This was held.  Lumbar puncture was initially considered because of the fever and headache, but she was given IV fluids, ibuprofen for the fever, and her headache resolved. She is now more awake alert. I do not feel there is any reason to be concerned about encephalitis  or meningitis currently. Urinalysis does still  show signs of infection. Remainder of her workup was unremarkable. Patient heart rate has improved with IV fluids and fever control. Patient will be admitted to the hospital for further management.    Gilda Crease, MD 03/13/14 [phone]

## 2014-03-13 NOTE — Progress Notes (Signed)
ANTIBIOTIC CONSULT NOTE - follow up  Pharmacy Consult for Vancomycin, Levaquin, Aztreonam Indication: rule out sepsis  Allergies  Allergen Reactions  . Codeine Itching, Swelling and Rash  . Levaquin [Levofloxacin In D5w] Itching, Swelling and Rash  . Penicillins Itching, Swelling and Rash  . Sulfa Drugs Cross Reactors Itching, Swelling and Rash   Patient Measurements: Height: 5\' 6"  (167.6 cm) Weight: 207 lb 10.8 oz (94.2 kg) IBW/kg (Calculated) : 59.3  Vital Signs: Temp: 100 F (37.8 C) (08/14 2135) Temp src: Oral (08/14 2135) BP: 130/61 mmHg (08/14 2135) Pulse Rate: 91 (08/14 2135) Intake/Output from previous day:   Intake/Output from this shift:    Labs:  Recent Labs  03/13/14 1749  WBC 12.3*  HGB 12.3  PLT 136*  CREATININE 1.17*   Estimated Creatinine Clearance: 52.5 ml/min (by C-G formula based on Cr of 1.17). No results found for this basename: VANCOTROUGH, VANCOPEAK, VANCORANDOM, GENTTROUGH, GENTPEAK, GENTRANDOM, TOBRATROUGH, TOBRAPEAK, TOBRARND, AMIKACINPEAK, AMIKACINTROU, AMIKACIN,  in the last 72 hours   Microbiology: No results found for this or any previous visit (from the past 720 hour(s)).  Medical History: Past Medical History  Diagnosis Date  . Hyperlipidemia     takes Simvastatin nightly  . Seasonal allergies     takes Allegra daily  . Depression     takes Prozac at bedtime  . Diabetes mellitus without complication     takes Metformin daily  . Sinoatrial node dysfunction     has ICD  . Anemia     takes Folic Acid daily  . Arthritis     takes Plaquenil daily and Methotrexate 2 times a week  . Family history of anesthesia complication     sister post op N/V  . Pacemaker   . Cough     takes Allegra daily  . Joint pain   . Joint swelling   . History of UTI   . History of blood transfusion    Vancomycin 8/14>> Aztreonam 8/14 >> Levaquin 8/14 >>  Assessment: 68yo female admitted with fever, n/v, and recent kidney infection.  Asked  to initiate Vancomycin, Aztreonam, and Levaquin for suspected sepsis.  Estimated Creatinine Clearance: 52.5 ml/min (by C-G formula based on Cr of 1.17).  Goal of Therapy:  Vancomycin trough level 15-20 mcg/ml Eradicate infection.  Plan:  Vancomycin 1gm IV q12hrs Aztreonam 1gm IV q8h Levaquin 750mg  IV q24hrs Vancomycin trough level at steady state Monitor labs, renal fxn, and cultures  68yo A 03/13/2014,9:48 PM

## 2014-03-13 NOTE — Progress Notes (Signed)
ANTIBIOTIC CONSULT NOTE - INITIAL  Pharmacy Consult for Vancomycin, Levaquin, Aztreonam Indication: rule out sepsis  Allergies  Allergen Reactions  . Codeine Itching, Swelling and Rash  . Penicillins Itching, Swelling and Rash  . Sulfa Drugs Cross Reactors Itching, Swelling and Rash    Patient Measurements: Height: 5\' 6"  (167.6 cm) Weight: 200 lb (90.719 kg) IBW/kg (Calculated) : 59.3  Vital Signs: Temp: 102.8 F (39.3 C) (08/14 1735) Temp src: Rectal (08/14 1735) BP: 139/65 mmHg (08/14 1735) Pulse Rate: 102 (08/14 1735) Intake/Output from previous day:   Intake/Output from this shift:    Labs:  Recent Labs  03/13/14 1749  WBC 12.3*  HGB 12.3  PLT 136*   Estimated Creatinine Clearance: 55.3 ml/min (by C-G formula based on Cr of 1.09). No results found for this basename: VANCOTROUGH, VANCOPEAK, VANCORANDOM, GENTTROUGH, GENTPEAK, GENTRANDOM, TOBRATROUGH, TOBRAPEAK, TOBRARND, AMIKACINPEAK, AMIKACINTROU, AMIKACIN,  in the last 72 hours   Microbiology: No results found for this or any previous visit (from the past 720 hour(s)).  Medical History: Past Medical History  Diagnosis Date  . Hyperlipidemia     takes Simvastatin nightly  . Seasonal allergies     takes Allegra daily  . Depression     takes Prozac at bedtime  . Diabetes mellitus without complication     takes Metformin daily  . Sinoatrial node dysfunction     has ICD  . Anemia     takes Folic Acid daily  . Arthritis     takes Plaquenil daily and Methotrexate 2 times a week  . Family history of anesthesia complication     sister post op N/V  . Pacemaker   . Cough     takes Allegra daily  . Joint pain   . Joint swelling   . History of UTI   . History of blood transfusion    Vancomycin 8/14>> Aztreonam 8/14 >> Levaquin 8/14 >>  Assessment: 68yo female admitted with fever, n/v, and recent kidney infection.  Asked to initiate Vancomycin, Aztreonam, and Levaquin for suspected sepsis.  Awaiting  labs.  Goal of Therapy:  Vancomycin trough level 15-20 mcg/ml Eradicate infection.  Plan:  Vancomycin 1500mg  now Aztreonam 2gm IV now Levaquin 750mg  IV now F/U labs for further dosing   68yo A 03/13/2014,6:15 PM

## 2014-03-14 ENCOUNTER — Encounter (HOSPITAL_COMMUNITY): Payer: Self-pay | Admitting: *Deleted

## 2014-03-14 DIAGNOSIS — N3000 Acute cystitis without hematuria: Secondary | ICD-10-CM

## 2014-03-14 DIAGNOSIS — I495 Sick sinus syndrome: Secondary | ICD-10-CM

## 2014-03-14 DIAGNOSIS — A4151 Sepsis due to Escherichia coli [E. coli]: Secondary | ICD-10-CM

## 2014-03-14 LAB — GLUCOSE, CAPILLARY
GLUCOSE-CAPILLARY: 122 mg/dL — AB (ref 70–99)
Glucose-Capillary: 143 mg/dL — ABNORMAL HIGH (ref 70–99)
Glucose-Capillary: 119 mg/dL — ABNORMAL HIGH (ref 70–99)
Glucose-Capillary: 122 mg/dL — ABNORMAL HIGH (ref 70–99)

## 2014-03-14 LAB — CBC
HCT: 35.3 % — ABNORMAL LOW (ref 36.0–46.0)
HEMOGLOBIN: 11.9 g/dL — AB (ref 12.0–15.0)
MCH: 31.2 pg (ref 26.0–34.0)
MCHC: 33.7 g/dL (ref 30.0–36.0)
MCV: 92.4 fL (ref 78.0–100.0)
Platelets: 134 10*3/uL — ABNORMAL LOW (ref 150–400)
RBC: 3.82 MIL/uL — ABNORMAL LOW (ref 3.87–5.11)
RDW: 16.5 % — AB (ref 11.5–15.5)
WBC: 10.5 10*3/uL (ref 4.0–10.5)

## 2014-03-14 LAB — COMPREHENSIVE METABOLIC PANEL
ALBUMIN: 2.4 g/dL — AB (ref 3.5–5.2)
ALK PHOS: 139 U/L — AB (ref 39–117)
ALT: 16 U/L (ref 0–35)
AST: 22 U/L (ref 0–37)
Anion gap: 12 (ref 5–15)
BILIRUBIN TOTAL: 1.1 mg/dL (ref 0.3–1.2)
BUN: 16 mg/dL (ref 6–23)
CHLORIDE: 105 meq/L (ref 96–112)
CO2: 21 mEq/L (ref 19–32)
Calcium: 7.8 mg/dL — ABNORMAL LOW (ref 8.4–10.5)
Creatinine, Ser: 1.11 mg/dL — ABNORMAL HIGH (ref 0.50–1.10)
GFR calc Af Amer: 57 mL/min — ABNORMAL LOW (ref >90)
GFR calc non Af Amer: 49 mL/min — ABNORMAL LOW (ref >90)
Glucose, Bld: 138 mg/dL — ABNORMAL HIGH (ref 70–99)
POTASSIUM: 4.2 meq/L (ref 3.7–5.3)
SODIUM: 138 meq/L (ref 137–147)
TOTAL PROTEIN: 6.1 g/dL (ref 6.0–8.3)

## 2014-03-14 NOTE — Progress Notes (Signed)
Utilization review Completed Autrey Human RN BSN   

## 2014-03-14 NOTE — Progress Notes (Signed)
ANTIBIOTIC CONSULT NOTE - follow up  Pharmacy Consult for Aztreonam Indication: rule out sepsis  Allergies  Allergen Reactions  . Codeine Itching, Swelling and Rash  . Levaquin [Levofloxacin In D5w] Itching, Swelling and Rash  . Penicillins Itching, Swelling and Rash  . Sulfa Drugs Cross Reactors Itching, Swelling and Rash   Patient Measurements: Height: 5\' 6"  (167.6 cm) Weight: 211 lb 6.7 oz (95.9 kg) IBW/kg (Calculated) : 59.3  Vital Signs: Temp: 98.7 F (37.1 C) (08/15 0610) Temp src: Oral (08/15 0610) BP: 128/61 mmHg (08/15 0610) Pulse Rate: 88 (08/15 0610) Intake/Output from previous day:   Intake/Output from this shift:    Labs:  Recent Labs  03/13/14 1749 03/14/14 0556  WBC 12.3* 10.5  HGB 12.3 11.9*  PLT 136* 134*  CREATININE 1.17* 1.11*   Estimated Creatinine Clearance: 55.8 ml/min (by C-G formula based on Cr of 1.11). No results found for this basename: VANCOTROUGH, VANCOPEAK, VANCORANDOM, GENTTROUGH, GENTPEAK, GENTRANDOM, TOBRATROUGH, TOBRAPEAK, TOBRARND, AMIKACINPEAK, AMIKACINTROU, AMIKACIN,  in the last 72 hours   Microbiology: Recent Results (from the past 720 hour(s))  CULTURE, BLOOD (ROUTINE X 2)     Status: None   Collection Time    03/13/14  5:49 PM      Result Value Ref Range Status   Specimen Description BLOOD RIGHT ANTECUBITAL   Final   Special Requests BOTTLES DRAWN AEROBIC AND ANAEROBIC 8CC   Final   Culture     Final   Value: GRAM NEGATIVE RODS RCRV DICKERSON,M AT 0725 BY GODFREY,O ON 03/14/14   Report Status PENDING   Incomplete   Medical History: Past Medical History  Diagnosis Date  . Hyperlipidemia     takes Simvastatin nightly  . Seasonal allergies     takes Allegra daily  . Depression     takes Prozac at bedtime  . Diabetes mellitus without complication     takes Metformin daily  . Sinoatrial node dysfunction     has ICD  . Anemia     takes Folic Acid daily  . Arthritis     takes Plaquenil daily and Methotrexate 2  times a week  . Family history of anesthesia complication     sister post op N/V  . Pacemaker   . Cough     takes Allegra daily  . Joint pain   . Joint swelling   . History of UTI   . History of blood transfusion    Vancomycin 8/14>>8/15 Aztreonam 8/14 >> Levaquin 8/14 >>8/15  Assessment: 68yo female admitted with fever, n/v, and recent kidney infection.  Asked to initiate Aztreonam for suspected sepsis.  Vancomycin and Levaquin d/c'd.  Estimated Creatinine Clearance: 55.8 ml/min (by C-G formula based on Cr of 1.11).  Goal of Therapy:  Eradicate infection.  Plan:  Aztreonam 1gm IV q8h Monitor labs, renal fxn, and cultures  68yo A 03/14/2014,8:34 AM

## 2014-03-14 NOTE — Progress Notes (Signed)
[facility] PROGRESS NOTE  Tracey Koch PZW:[id number] DOB: 02/07/1946 DOA: 03/13/2014 PCP: Louie Boston, MD  Assessment/Plan: 1. Sepsis -Present on admission, evidenced by positive blood cultures with lab reporting the growth of gram negative rods in the initial blood culture, temperature of of 102.8 overnight, HR of 105 -Source of infection likely to be Urinary Tract Infection.   -Plan to repeat blood cultures in AM, meanwhile continue emperic antibiotic therapy with Aztreonam. Stop IV Vancomycin and Levaquin. Patient has history of beta lactam allergy.   2. Urinary Tract Infection -She has history of recurrent UTI's -Urine culture from 05/06/2013 grew Ecoli, organism resistant to Ampicillin, Cipro, Levaquin, and Bactrim.  It appears she may have been treated with Cipro in the outpatient setting -Continue Aztreonam for now.  -Plan to repeat her blood cultures in 24 hours, follow up on urine cultures.   3. Diabetes Mellitus -Blood sugars remain stable -Continue Metformin 500 mg PO BID, accuchecks q ac and q hs with sliding scale coverage.   Code Status: Full Code Family Communication: Spoke with patient's husband present at bedside Disposition Plan: Continue IV AB therapy, will likely go home when medial stable   Antibiotics:  Aztreonam  HPI/Subjective: Patient is a pleasant 68 year old with a past medical history of urinary tract infections, with a prior culture obtained on 05/06/2014 growing Ecoli, organism resistant to Ampicillin, Cipro, Levaquin, and Bactrim. She presented with complaints of fevers, chills, weakness.   Objective: Filed Vitals:   03/14/14 0610  BP: 128/61  Pulse: 88  Temp: 98.7 F (37.1 C)  Resp: 20    Intake/Output Summary (Last 24 hours) at 03/14/14 1057 Last data filed at 03/14/14 0847  Gross per 24 hour  Intake      0 ml  Output   1000 ml  Net  -1000 ml   Filed Weights   03/13/14 1735 03/13/14 2135 03/14/14 0610  Weight: 90.719 kg  (200 lb) 94.2 kg (207 lb 10.8 oz) 95.9 kg (211 lb 6.7 oz)    Exam:   General:  Ill appearing, however she is awake and alert, oriented x 3, following commands for me  Cardiovascular: Regular rate and rhythm, normal S1S2  Respiratory: Normal inspriatory effort, lungs are clear  Abdomen: Soft, nontender nondistened  Musculoskeletal: No edema  Data Reviewed: Basic Metabolic Panel:  Recent Labs Lab 03/13/14 1749 03/14/14 0556  NA 136* 138  K 3.7 4.2  CL 102 105  CO2 20 21  GLUCOSE 118* 138*  BUN 17 16  CREATININE 1.17* 1.11*  CALCIUM 8.0* 7.8*   Liver Function Tests:  Recent Labs Lab 03/13/14 1749 03/14/14 0556  AST 25 22  ALT 17 16  ALKPHOS 143* 139*  BILITOT 1.0 1.1  PROT 6.3 6.1  ALBUMIN 2.6* 2.4*    Recent Labs Lab 03/13/14 1749  LIPASE 25   No results found for this basename: AMMONIA,  in the last 168 hours CBC:  Recent Labs Lab 03/13/14 1749 03/14/14 0556  WBC 12.3* 10.5  NEUTROABS 10.1*  --   HGB 12.3 11.9*  HCT 35.7* 35.3*  MCV 91.8 92.4  PLT 136* 134*   Cardiac Enzymes:  Recent Labs Lab 03/13/14 1749  TROPONINI <0.30   BNP (last 3 results) No results found for this basename: PROBNP,  in the last 8760 hours CBG:  Recent Labs Lab 03/13/14 2211 03/14/14 0732  GLUCAP 118* 143*    Recent Results (from the past 240 hour(s))  CULTURE, BLOOD (ROUTINE X 2)  Status: None   Collection Time    03/13/14  5:49 PM      Result Value Ref Range Status   Specimen Description BLOOD RIGHT ANTECUBITAL   Final   Special Requests BOTTLES DRAWN AEROBIC AND ANAEROBIC 8CC   Final   Culture     Final   Value: GRAM NEGATIVE RODS RCRV DICKERSON,M AT 0725 BY GODFREY,O ON 03/14/14   Report Status PENDING   Incomplete  CULTURE, BLOOD (ROUTINE X 2)     Status: None   Collection Time    03/13/14  5:58 PM      Result Value Ref Range Status   Specimen Description BLOOD LEFT HAND   Final   Special Requests BOTTLES DRAWN AEROBIC AND ANAEROBIC 8CC    Final   Culture     Final   Value: GRAM NEGATIVE RODS RCRV DICKERSON,M AT 0725 BY GODFREY,O ON 03/14/14   Report Status PENDING   Incomplete     Studies: Dg Chest 1 View  03/13/2014   CLINICAL DATA:  Fever and headache.  EXAM: CHEST - 1 VIEW  COMPARISON:  Single view of the chest 03/11/2014.  FINDINGS: Lungs are clear. Heart size is normal. No pneumothorax or pleural fluid. Pacing device again seen.  IMPRESSION: No acute disease.   Electronically Signed   By: Drusilla Kanner M.D.   On: 03/13/2014 18:32   Ct Head Wo Contrast  03/13/2014   CLINICAL DATA:  Fever, headache and confusion.  EXAM: CT HEAD WITHOUT CONTRAST  TECHNIQUE: Contiguous axial images were obtained from the base of the skull through the vertex without intravenous contrast.  COMPARISON:  10/30/2011.  FINDINGS: No evidence of an acute infarct, acute hemorrhage, mass lesion, mass effect or hydrocephalus. Mild atrophy. Mucosal thickening is seen in the ethmoid air cells. No air-fluid levels. Mastoid air cells are clear.  IMPRESSION: 1. No acute intracranial abnormality. 2. Mild atrophy. 3. Mucosal thickening in the ethmoid air cells.   Electronically Signed   By: Leanna Battles M.D.   On: 03/13/2014 18:32    Scheduled Meds: . aztreonam  1 g Intravenous Q8H  . darifenacin  7.5 mg Oral Daily  . heparin  5,000 Units Subcutaneous 3 times per day  . hydroxychloroquine  400 mg Oral Daily  . insulin aspart  0-20 Units Subcutaneous TID WC  . insulin aspart  0-5 Units Subcutaneous QHS  . loratadine  10 mg Oral Daily  . metFORMIN  500 mg Oral BID WC  . simvastatin  20 mg Oral QPM   Continuous Infusions:   Active Problems:   HYPERTENSION, UNSPECIFIED   ARTHRITIS, RHEUMATOID   PACEMAKER, PERMANENT   Sepsis   UTI (urinary tract infection)   Diabetes    Time spent: 35 min    Jeralyn Bennett  [facility] Pager [phone]. If 7PM-7AM, please contact night-coverage at [email or web address], password [facility] 03/14/2014, 10:57 AM   LOS: 1 day

## 2014-03-15 DIAGNOSIS — I10 Essential (primary) hypertension: Secondary | ICD-10-CM

## 2014-03-15 LAB — GLUCOSE, CAPILLARY
Glucose-Capillary: 116 mg/dL — ABNORMAL HIGH (ref 70–99)
Glucose-Capillary: 162 mg/dL — ABNORMAL HIGH (ref 70–99)
Glucose-Capillary: 121 mg/dL — ABNORMAL HIGH (ref 70–99)
Glucose-Capillary: 124 mg/dL — ABNORMAL HIGH (ref 70–99)

## 2014-03-15 NOTE — Progress Notes (Signed)
[facility] PROGRESS NOTE  Tracey Koch HBZ:[id number] DOB: Oct 13, 1945 DOA: 03/13/2014 PCP: Louie Boston, MD  Assessment/Plan: 1. Sepsis -Present on admission, evidenced by positive blood cultures with lab reporting the growth of gram negative rods in the initial blood culture, temperature of of 102.8, HR of 105 -Source of infection likely to be Urinary Tract Infection.   -Blood cultures repeated this AM, meanwhile continue emperic antibiotic therapy with Aztreonam. Stopped IV Vancomycin and Levaquin. Patient has history of beta lactam allergy.   2. Urinary Tract Infection -She has history of recurrent UTI's -Urine culture from 05/06/2013 grew Ecoli, organism resistant to Ampicillin, Cipro, Levaquin, and Bactrim.  It appears she may have been treated with Cipro in the outpatient setting -Continue Aztreonam for now.  -Blood cultures were repeated this morning. Blood cultures from 03/13/2014 growing Escherichia coli, susceptibility testing pending at the time of this dictation. Urine cultures drawn on 03/13/2014 growing greater than 1000 colony forming units of Escherichia coli, susceptibility testing pending.   3. Diabetes Mellitus -Blood sugars remain stable -Continue Metformin 500 mg PO BID, accuchecks q ac and q hs with sliding scale coverage.   Code Status: Full Code Family Communication: Spoke with patient's husband present at bedside Disposition Plan: Continue IV AB therapy, will likely go home when medial stable   Antibiotics:  Aztreonam  HPI/Subjective: Patient is a pleasant 68 year old with a past medical history of urinary tract infections, with a prior culture obtained on 05/06/2014 growing Ecoli, organism resistant to Ampicillin, Cipro, Levaquin, and Bactrim. She presented with complaints of fevers, chills, weakness.     Objective: Filed Vitals:   03/15/14 0548  BP: 129/67  Pulse:   Temp: 99.5 F (37.5 C)  Resp: 20    Intake/Output Summary (Last 24 hours)  at 03/15/14 1128 Last data filed at 03/15/14 0725  Gross per 24 hour  Intake    580 ml  Output   1950 ml  Net  -1370 ml   Filed Weights   03/13/14 1735 03/13/14 2135 03/14/14 0610  Weight: 90.719 kg (200 lb) 94.2 kg (207 lb 10.8 oz) 95.9 kg (211 lb 6.7 oz)    Exam:   General:  She is awake and alert, oriented x 3, following commands for me, states feeling better  Cardiovascular: Regular rate and rhythm, normal S1S2  Respiratory: Normal inspriatory effort, lungs are clear  Abdomen: Soft, nontender nondistened  Musculoskeletal: No edema  Data Reviewed: Basic Metabolic Panel:  Recent Labs Lab 03/13/14 1749 03/14/14 0556  NA 136* 138  K 3.7 4.2  CL 102 105  CO2 20 21  GLUCOSE 118* 138*  BUN 17 16  CREATININE 1.17* 1.11*  CALCIUM 8.0* 7.8*   Liver Function Tests:  Recent Labs Lab 03/13/14 1749 03/14/14 0556  AST 25 22  ALT 17 16  ALKPHOS 143* 139*  BILITOT 1.0 1.1  PROT 6.3 6.1  ALBUMIN 2.6* 2.4*    Recent Labs Lab 03/13/14 1749  LIPASE 25   No results found for this basename: AMMONIA,  in the last 168 hours CBC:  Recent Labs Lab 03/13/14 1749 03/14/14 0556  WBC 12.3* 10.5  NEUTROABS 10.1*  --   HGB 12.3 11.9*  HCT 35.7* 35.3*  MCV 91.8 92.4  PLT 136* 134*   Cardiac Enzymes:  Recent Labs Lab 03/13/14 1749  TROPONINI <0.30   BNP (last 3 results) No results found for this basename: PROBNP,  in the last 8760 hours CBG:  Recent Labs Lab 03/14/14 0732 03/14/14 1200  03/14/14 1651 03/14/14 2042 03/15/14 0737  GLUCAP 143* 119* 122* 122* 116*    Recent Results (from the past 240 hour(s))  CULTURE, BLOOD (ROUTINE X 2)     Status: None   Collection Time    03/13/14  5:49 PM      Result Value Ref Range Status   Specimen Description BLOOD RIGHT ANTECUBITAL   Final   Special Requests BOTTLES DRAWN AEROBIC AND ANAEROBIC 8CC   Final   Culture  Setup Time     Final   Value: 03/15/2014 02:29     Performed at [facility]    Culture     Final   Value: ESCHERICHIA COLI     Note: Gram Stain Report Called to,Read Back By and Verified With: DICKERSON M AT 0725 BY GODFREY O ON 03/14/14  Performed at [facility]     Performed at [facility]   Report Status PENDING   Incomplete  CULTURE, BLOOD (ROUTINE X 2)     Status: None   Collection Time    03/13/14  5:58 PM      Result Value Ref Range Status   Specimen Description BLOOD LEFT HAND   Final   Special Requests BOTTLES DRAWN AEROBIC AND ANAEROBIC 8CC   Final   Culture  Setup Time     Final   Value: 03/14/2014 06:45     Performed at [facility]   Culture     Final   Value: ESCHERICHIA COLI        BLOOD CULTURE RECEIVED NO GROWTH TO DATE CULTURE WILL BE HELD FOR 5 DAYS BEFORE ISSUING A FINAL NEGATIVE REPORT     Note: Gram Stain Report Called to,Read Back By and Verified With: DICKERSON M AT 0725 BY GODFREY O ON 03/14/14 Performed at [facility]     Performed at [facility]   Report Status PENDING   Incomplete  URINE CULTURE     Status: None   Collection Time    03/13/14  6:11 PM      Result Value Ref Range Status   Specimen Description URINE, CATHETERIZED   Final   Special Requests NONE   Final   Culture  Setup Time     Final   Value: 03/14/2014 00:59     Performed at [facility] Count     Final   Value: >=100,000 COLONIES/ML     Performed at [facility]   Culture     Final   Value: ESCHERICHIA COLI     Performed at [facility]   Report Status PENDING   Incomplete  CULTURE, BLOOD (ROUTINE X 2)     Status: None   Collection Time    03/15/14  4:05 AM      Result Value Ref Range Status   Specimen Description Blood BLOOD LEFT HAND   Final   Special Requests BOTTLES DRAWN AEROBIC AND ANAEROBIC 9CC   Final   Culture NO GROWTH <24 HRS   Final   Report Status PENDING   Incomplete  CULTURE, BLOOD (ROUTINE X 2)     Status: None   Collection Time    03/15/14  4:11 AM      Result Value  Ref Range Status   Specimen Description Blood BLOOD RIGHT HAND   Final   Special Requests BOTTLES DRAWN AEROBIC ONLY 8CC   Final   Culture NO GROWTH <24 HRS   Final  Report Status PENDING   Incomplete     Studies: Dg Chest 1 View  03/13/2014   CLINICAL DATA:  Fever and headache.  EXAM: CHEST - 1 VIEW  COMPARISON:  Single view of the chest 03/11/2014.  FINDINGS: Lungs are clear. Heart size is normal. No pneumothorax or pleural fluid. Pacing device again seen.  IMPRESSION: No acute disease.   Electronically Signed   By: Drusilla Kannerhomas  Dalessio M.D.   On: 03/13/2014 18:32   Ct Head Wo Contrast  03/13/2014   CLINICAL DATA:  Fever, headache and confusion.  EXAM: CT HEAD WITHOUT CONTRAST  TECHNIQUE: Contiguous axial images were obtained from the base of the skull through the vertex without intravenous contrast.  COMPARISON:  10/30/2011.  FINDINGS: No evidence of an acute infarct, acute hemorrhage, mass lesion, mass effect or hydrocephalus. Mild atrophy. Mucosal thickening is seen in the ethmoid air cells. No air-fluid levels. Mastoid air cells are clear.  IMPRESSION: 1. No acute intracranial abnormality. 2. Mild atrophy. 3. Mucosal thickening in the ethmoid air cells.   Electronically Signed   By: Leanna BattlesMelinda  Blietz M.D.   On: 03/13/2014 18:32    Scheduled Meds: . aztreonam  1 g Intravenous Q8H  . darifenacin  7.5 mg Oral Daily  . heparin  5,000 Units Subcutaneous 3 times per day  . hydroxychloroquine  400 mg Oral Daily  . insulin aspart  0-20 Units Subcutaneous TID WC  . insulin aspart  0-5 Units Subcutaneous QHS  . loratadine  10 mg Oral Daily  . metFORMIN  500 mg Oral BID WC  . simvastatin  20 mg Oral QPM   Continuous Infusions:   Principal Problem:   Sepsis Active Problems:   UTI (urinary tract infection)   Diabetes   HYPERTENSION, UNSPECIFIED   ARTHRITIS, RHEUMATOID   PACEMAKER, PERMANENT    Time spent: 25 min    Jeralyn BennettZAMORA, Nakeyia Menden  [facility] Pager [phone]. If 7PM-7AM,  please contact night-coverage at [email or web address], password [email or web address] 03/15/2014, 11:28 AM  LOS: 2 days

## 2014-03-16 ENCOUNTER — Encounter (HOSPITAL_COMMUNITY): Payer: Medicare Other

## 2014-03-16 LAB — CULTURE, BLOOD (ROUTINE X 2)

## 2014-03-16 LAB — URINE CULTURE: Colony Count: >=100000

## 2014-03-16 LAB — GLUCOSE, CAPILLARY
Glucose-Capillary: 126 mg/dL — ABNORMAL HIGH (ref 70–99)
Glucose-Capillary: 133 mg/dL — ABNORMAL HIGH (ref 70–99)
Glucose-Capillary: 117 mg/dL — ABNORMAL HIGH (ref 70–99)
Glucose-Capillary: 115 mg/dL — ABNORMAL HIGH (ref 70–99)

## 2014-03-16 LAB — CBC
HCT: 33.6 % — ABNORMAL LOW (ref 36.0–46.0)
Hemoglobin: 11.2 g/dL — ABNORMAL LOW (ref 12.0–15.0)
MCH: 30.4 pg (ref 26.0–34.0)
MCHC: 33.3 g/dL (ref 30.0–36.0)
MCV: 91.1 fL (ref 78.0–100.0)
Platelets: 136 K/uL — ABNORMAL LOW (ref 150–400)
RBC: 3.69 MIL/uL — ABNORMAL LOW (ref 3.87–5.11)
RDW: 16.5 % — ABNORMAL HIGH (ref 11.5–15.5)
WBC: 9.1 K/uL (ref 4.0–10.5)

## 2014-03-16 MED ORDER — SODIUM CHLORIDE 0.9 % IV SOLN
1.0000 g | INTRAVENOUS | Status: DC
  Administered 2014-03-16 – 2014-03-17 (×2): 1 g via INTRAVENOUS
  Filled 2014-03-16 (×2): qty 1

## 2014-03-16 MED ORDER — SODIUM CHLORIDE 0.9 % IJ SOLN
10.0000 mL | Freq: Two times a day (BID) | INTRAMUSCULAR | Status: DC
  Administered 2014-03-16 – 2014-03-17 (×3): 10 mL

## 2014-03-16 MED ORDER — SODIUM CHLORIDE 0.9 % IJ SOLN
10.0000 mL | INTRAMUSCULAR | Status: DC | PRN
  Administered 2014-03-16: 20 mL

## 2014-03-16 NOTE — Evaluation (Signed)
Physical Therapy Evaluation Patient Details Name: Tracey Koch MRN: [id number] DOB: [birth year] Today's Date: 03/16/2014   History of Present Illness  This is a 68 year old lady who gives a 3 week history of dysuria, fever, nausea vomiting and rigors  associated with a degree of confusion. She also has had lower abdominal pain and dysuria. She was [facility] at [facility] on 2 occasions. On the first occasion she was kept in the hospital for 3 days and on the second occasion she was discharged from the emergency room. She appears to have not improved despite this therapy. Today she once again had a fever and weakness. Evaluation in the emergency room shows urinalysis consistent with UTI and her clinical picture appears to be one of urosepsis. She is now being admitted for further management.  Clinical Impression  Pt is a 68 year old female who presents to physical therapy with dx of sepsis.  During evaluation, pt required mod (I) with bed mobility skills, min assist and use of RW for transfers, and min guard and use of RW for gait of 150, with 3 standing rest breaks.  Noted decreased foot clearance and decreased step length during gait, with flat foot initial contact.  Recommend continued PT to address strengthening, activity tolerance, and balance for improved functional mobility skills with transition to HHPT at discharge.  No DME recommendations.     Follow Up Recommendations Home health PT    Equipment Recommendations  None recommended by PT       Precautions / Restrictions Precautions Precautions: Fall Restrictions Weight Bearing Restrictions: No      Mobility  Bed Mobility Overal bed mobility: Modified Independent                Transfers Overall transfer level: Needs assistance Equipment used: Rolling walker (2 wheeled) Transfers: Sit to/from Stand Sit to Stand: Min assist            Ambulation/Gait Ambulation/Gait assistance: Min guard Ambulation Distance  (Feet): 150 Feet Assistive device: Rolling walker (2 wheeled) Gait Pattern/deviations: Step-through pattern;Decreased step length - right;Decreased step length - left;Decreased dorsiflexion - right;Decreased dorsiflexion - left   Gait velocity interpretation: Below normal speed for age/gender General Gait Details: Flat foot initial contact     Balance Overall balance assessment: Needs assistance Sitting-balance support: Feet supported;No upper extremity supported Sitting balance-Leahy Scale: Normal     Standing balance support: Bilateral upper extremity supported;During functional activity Standing balance-Leahy Scale: Good                               Pertinent Vitals/Pain Pain Assessment: No/denies pain    Home Living Family/patient expects to be discharged to:: Private residence Living Arrangements: Spouse/significant other (Son and daugher/son in Social worker live nearby, another son in [place]) Available Help at Discharge: Family;Available 24 hours/day Type of Home: House Home Access: Level entry     Home Layout: One level Home Equipment: Walker - 2 wheels;Grab bars - tub/shower;Cane - single point;Cane - quad;Shower seat;Electric scooter Aeronautical engineer, lift chair)      Prior Function Level of Independence: Independent with assistive device(s)               Hand Dominance   Dominant Hand: Right    Extremity/Trunk Assessment               Lower Extremity Assessment: Generalized weakness         Communication   Communication: No difficulties  Cognition Arousal/Alertness: Awake/alert Behavior During Therapy: WFL for tasks assessed/performed Overall Cognitive Status: Within Functional Limits for tasks assessed                               Assessment/Plan    PT Assessment Patient needs continued PT services  PT Diagnosis Difficulty walking;Generalized weakness   PT Problem List Decreased strength;Decreased activity  tolerance;Decreased mobility  PT Treatment Interventions Balance training;Gait training;Neuromuscular re-education;Functional mobility training;Therapeutic activities;Therapeutic exercise;Stair training;Patient/family education   PT Goals (Current goals can be found in the Care Plan section) Acute Rehab PT Goals Patient Stated Goal: none stated PT Goal Formulation: With patient/family Time For Goal Achievement: 03/30/14 Potential to Achieve Goals: Good    Frequency Min 3X/week    End of Session Equipment Utilized During Treatment: Gait belt Activity Tolerance: Patient limited by fatigue Patient left: in bed;with call bell/phone within reach;with bed alarm set;with family/visitor present           Time: 4970-2637 PT Time Calculation (min): 20 min   Charges:   PT Evaluation $Initial PT Evaluation Tier I: 1 Procedure          Tracey Koch 03/16/2014, 10:17 AM

## 2014-03-16 NOTE — Care Management Note (Signed)
    Page 1 of 1   03/16/2014     2:17:49 PM CARE MANAGEMENT NOTE 03/16/2014  Patient:  JAIA, ALONGE   Account Number:  [id number]  Date Initiated:  03/16/2014  Documentation initiated by:  Kathyrn Sheriff  Subjective/Objective Assessment:   Patient lives at home with husband. Patient admitted for urosepsis. patient independent at home with ADL's, has no HH services, DME or medication needs.     Action/Plan:   Patient plans to discharge home with [facility] services for PT and IV abx administrations. Patient says her daughter will be able to learn to administer the IV abx. Pt's requests [facility] be HH agency.   Anticipated DC Date:  03/17/2014   Anticipated DC Plan:  HOME W HOME HEALTH SERVICES      DC Planning Services  CM consult      [facility] Choice  HOME HEALTH   Choice offered to / List presented to:  C-1 Patient        HH arranged  HH-1 RN  HH-2 PT      [facility] agency  [facility]   Status of service:  Completed, signed off Medicare Important Message given?   (If response is "NO", the following Medicare IM given date fields will be blank) Date Medicare IM given:   Medicare IM given by:   Date Additional Medicare IM given:   Additional Medicare IM given by:    Discharge Disposition:  HOME W HOME HEALTH SERVICES  Per UR Regulation:    If discussed at Long Length of Stay Meetings, dates discussed:    Comments:  03/16/2014 1400 Kathyrn Sheriff, RN, MSN, PCCN Alroy Bailiff, of [facility], notified fo new referral, Bonita Quin will obtain pt information from chart. Patient notified that [facility] iwll have 48 hours from discharge to see pt. Patient has no further CM needs at this time. Discharge planned for tomorrow morning.

## 2014-03-16 NOTE — Progress Notes (Signed)
[facility] PROGRESS NOTE  Tracey Koch CZY:[id number] DOB: [birth year] DOA: 03/13/2014 PCP: Louie Boston, MD  Assessment/Plan: 1. Sepsis -Present on admission, evidenced by positive blood cultures with lab reporting the growth of E.coli in the initial blood culture, temperature of of 102.8, HR of 105 -Source of infection likely to be Urinary Tract Infection.   -Blood cultures repeated on 03/15/2014, showing no growth in the last 24 hours   2. Urinary Tract Infection -She has history of recurrent UTI's -Urine culture from 05/06/2013 grew Ecoli, organism resistant to Ampicillin, Cipro, Levaquin, and Bactrim.  It appears she may have been treated with Cipro in the outpatient setting -Blood cultures from 03/13/2014 growing E coli having same susceptibility pattern from organism isolated form urine obtained on this hospitalization.   -Repeat blood cultures were obtained on 03/15/2014, showing no growth to date -Case discussed with Dr Luciana Axe of infectious disease, recommending 7 more days of emperic antibiotic therapy with Invanz.  -Will place PICC line for administration of antimicrobial therapy as an outpatient.   3. Diabetes Mellitus -Blood sugars remain stable -Continue Metformin 500 mg PO BID, accuchecks q ac and q hs with sliding scale coverage.   Code Status: Full Code Family Communication: Spoke with patient's husband present at bedside Disposition Plan: Anticipate discharge on 03/17/2014 on home IV antibiotic therapy for 7 days   Antibiotics:  Aztreonam  HPI/Subjective: Patient is a pleasant 68 year old with a past medical history of urinary tract infections, with a prior culture obtained on 05/06/2014 growing Ecoli, organism resistant to Ampicillin, Cipro, Levaquin, and Bactrim. She presented with complaints of fevers, chills, weakness.     Objective: Filed Vitals:   03/16/14 0538  BP: 134/66  Pulse: 74  Temp: 98.8 F (37.1 C)  Resp: 20    Intake/Output Summary  (Last 24 hours) at 03/16/14 1327 Last data filed at 03/16/14 0444  Gross per 24 hour  Intake    100 ml  Output      1 ml  Net     99 ml   Filed Weights   03/13/14 1735 03/13/14 2135 03/14/14 0610  Weight: 90.719 kg (200 lb) 94.2 kg (207 lb 10.8 oz) 95.9 kg (211 lb 6.7 oz)    Exam:   General:  She is awake and alert, oriented x 3, following commands for me, states feeling better  Cardiovascular: Regular rate and rhythm, normal S1S2  Respiratory: Normal inspriatory effort, lungs are clear  Abdomen: Soft, nontender nondistened  Musculoskeletal: No edema  Data Reviewed: Basic Metabolic Panel:  Recent Labs Lab 03/13/14 1749 03/14/14 0556  NA 136* 138  K 3.7 4.2  CL 102 105  CO2 20 21  GLUCOSE 118* 138*  BUN 17 16  CREATININE 1.17* 1.11*  CALCIUM 8.0* 7.8*   Liver Function Tests:  Recent Labs Lab 03/13/14 1749 03/14/14 0556  AST 25 22  ALT 17 16  ALKPHOS 143* 139*  BILITOT 1.0 1.1  PROT 6.3 6.1  ALBUMIN 2.6* 2.4*    Recent Labs Lab 03/13/14 1749  LIPASE 25   No results found for this basename: AMMONIA,  in the last 168 hours CBC:  Recent Labs Lab 03/13/14 1749 03/14/14 0556 03/16/14 0626  WBC 12.3* 10.5 9.1  NEUTROABS 10.1*  --   --   HGB 12.3 11.9* 11.2*  HCT 35.7* 35.3* 33.6*  MCV 91.8 92.4 91.1  PLT 136* 134* 136*   Cardiac Enzymes:  Recent Labs Lab 03/13/14 1749  TROPONINI <0.30   BNP (last  3 results) No results found for this basename: PROBNP,  in the last 8760 hours CBG:  Recent Labs Lab 03/15/14 1154 03/15/14 1611 03/15/14 2113 03/16/14 0749 03/16/14 1146  GLUCAP 162* 121* 124* 126* 133*    Recent Results (from the past 240 hour(s))  CULTURE, BLOOD (ROUTINE X 2)     Status: None   Collection Time    03/13/14  5:49 PM      Result Value Ref Range Status   Specimen Description BLOOD RIGHT ANTECUBITAL   Final   Special Requests BOTTLES DRAWN AEROBIC AND ANAEROBIC 8CC   Final   Culture  Setup Time     Final   Value:  03/15/2014 02:29     Performed at [facility]   Culture     Final   Value: ESCHERICHIA COLI     Note: SUSCEPTIBILITIES PERFORMED ON PREVIOUS CULTURE WITHIN THE LAST 5 DAYS.     Note: Gram Stain Report Called to,Read Back By and Verified With: DICKERSON M AT 0725 BY GODFREY O ON 03/14/14  Performed at [facility]     Performed at [facility]   Report Status 03/16/2014 FINAL   Final  CULTURE, BLOOD (ROUTINE X 2)     Status: None   Collection Time    03/13/14  5:58 PM      Result Value Ref Range Status   Specimen Description BLOOD LEFT HAND   Final   Special Requests BOTTLES DRAWN AEROBIC AND ANAEROBIC 8CC   Final   Culture  Setup Time     Final   Value: 03/14/2014 06:45     Performed at [facility]   Culture     Final   Value: ESCHERICHIA COLI     Note: Gram Stain Report Called to,Read Back By and Verified With: Seward Grater AT 0725 BY GODFREY O ON 03/14/14 Performed at [facility]     Performed at [facility]   Report Status 03/16/2014 FINAL   Final   Organism ID, Bacteria ESCHERICHIA COLI   Final  URINE CULTURE     Status: None   Collection Time    03/13/14  6:11 PM      Result Value Ref Range Status   Specimen Description URINE, CATHETERIZED   Final   Special Requests NONE   Final   Culture  Setup Time     Final   Value: 03/14/2014 00:59     Performed at [facility] Count     Final   Value: >=100,000 COLONIES/ML     Performed at [facility]   Culture     Final   Value: ESCHERICHIA COLI     Performed at [facility]   Report Status 03/16/2014 FINAL   Final   Organism ID, Bacteria ESCHERICHIA COLI   Final  CULTURE, BLOOD (ROUTINE X 2)     Status: None   Collection Time    03/15/14  4:05 AM      Result Value Ref Range Status   Specimen Description Blood BLOOD LEFT HAND   Final   Special Requests BOTTLES DRAWN AEROBIC AND ANAEROBIC 9CC   Final   Culture NO GROWTH 1 DAY   Final   Report  Status PENDING   Incomplete  CULTURE, BLOOD (ROUTINE X 2)     Status: None   Collection Time    03/15/14  4:11 AM      Result Value Ref Range  Status   Specimen Description Blood BLOOD RIGHT HAND   Final   Special Requests BOTTLES DRAWN AEROBIC ONLY 8CC   Final   Culture NO GROWTH 1 DAY   Final   Report Status PENDING   Incomplete     Studies: No results found.  Scheduled Meds: . aztreonam  1 g Intravenous Q8H  . darifenacin  7.5 mg Oral Daily  . heparin  5,000 Units Subcutaneous 3 times per day  . hydroxychloroquine  400 mg Oral Daily  . insulin aspart  0-20 Units Subcutaneous TID WC  . insulin aspart  0-5 Units Subcutaneous QHS  . loratadine  10 mg Oral Daily  . metFORMIN  500 mg Oral BID WC  . simvastatin  20 mg Oral QPM   Continuous Infusions:   Principal Problem:   Sepsis Active Problems:   UTI (urinary tract infection)   Diabetes   HYPERTENSION, UNSPECIFIED   ARTHRITIS, RHEUMATOID   PACEMAKER, PERMANENT    Time spent: 25 min    Jeralyn Bennett  [facility] Pager [phone]. If 7PM-7AM, please contact night-coverage at [email or web address], password [facility] 03/16/2014, 1:27 PM  LOS: 3 days

## 2014-03-16 NOTE — Progress Notes (Signed)
ANTIBIOTIC CONSULT NOTE - follow up  Pharmacy Consult for Aztreonam Indication: rule out sepsis  Allergies  Allergen Reactions  . Codeine Itching, Swelling and Rash  . Levaquin [Levofloxacin In D5w] Itching, Swelling and Rash  . Penicillins Itching, Swelling and Rash  . Sulfa Drugs Cross Reactors Itching, Swelling and Rash   Patient Measurements: Height: 5\' 6"  (167.6 cm) Weight: 211 lb 6.7 oz (95.9 kg) IBW/kg (Calculated) : 59.3  Vital Signs: Temp: 98.8 F (37.1 C) (08/17 0538) Temp src: Oral (08/17 0538) BP: 134/66 mmHg (08/17 0538) Pulse Rate: 74 (08/17 0538) Intake/Output from previous day: 08/16 0701 - 08/17 0700 In: 200 [IV Piggyback:200] Out: 1 [Urine:1] Intake/Output from this shift:    Labs:  Recent Labs  03/13/14 1749 03/14/14 0556 03/16/14 0626  WBC 12.3* 10.5 9.1  HGB 12.3 11.9* 11.2*  PLT 136* 134* 136*  CREATININE 1.17* 1.11*  --    Estimated Creatinine Clearance: 55.8 ml/min (by C-G formula based on Cr of 1.11). No results found for this basename: VANCOTROUGH, 03/18/14, VANCORANDOM, GENTTROUGH, GENTPEAK, GENTRANDOM, TOBRATROUGH, TOBRAPEAK, TOBRARND, AMIKACINPEAK, AMIKACINTROU, AMIKACIN,  in the last 72 hours   Microbiology: Recent Results (from the past 720 hour(s))  CULTURE, BLOOD (ROUTINE X 2)     Status: None   Collection Time    03/13/14  5:49 PM      Result Value Ref Range Status   Specimen Description BLOOD RIGHT ANTECUBITAL   Final   Special Requests BOTTLES DRAWN AEROBIC AND ANAEROBIC 8CC   Final   Culture  Setup Time     Final   Value: 03/15/2014 02:29     Performed at 03/17/2014   Culture     Final   Value: ESCHERICHIA COLI     Note: SUSCEPTIBILITIES PERFORMED ON PREVIOUS CULTURE WITHIN THE LAST 5 DAYS.     Note: Gram Stain Report Called to,Read Back By and Verified With: DICKERSON M AT 0725 BY GODFREY O ON 03/14/14  Performed at [facility]     Performed at [facility]   Report Status 03/16/2014 FINAL    Final  CULTURE, BLOOD (ROUTINE X 2)     Status: None   Collection Time    03/13/14  5:58 PM      Result Value Ref Range Status   Specimen Description BLOOD LEFT HAND   Final   Special Requests BOTTLES DRAWN AEROBIC AND ANAEROBIC 8CC   Final   Culture  Setup Time     Final   Value: 03/14/2014 06:45     Performed at 03/16/2014   Culture     Final   Value: ESCHERICHIA COLI     Note: Gram Stain Report Called to,Read Back By and Verified With: [employer] AT 0725 BY GODFREY O ON 03/14/14 Performed at [facility]     Performed at [facility]   Report Status 03/16/2014 FINAL   Final   Organism ID, Bacteria ESCHERICHIA COLI   Final  URINE CULTURE     Status: None   Collection Time    03/13/14  6:11 PM      Result Value Ref Range Status   Specimen Description URINE, CATHETERIZED   Final   Special Requests NONE   Final   Culture  Setup Time     Final   Value: 03/14/2014 00:59     Performed at 03/16/2014   Colony Count     Final   Value: >=100,000 COLONIES/ML  Performed at [employer]     Final   Value: ESCHERICHIA COLI     Performed at [employer]   Report Status 03/16/2014 FINAL   Final   Organism ID, Bacteria ESCHERICHIA COLI   Final  CULTURE, BLOOD (ROUTINE X 2)     Status: None   Collection Time    03/15/14  4:05 AM      Result Value Ref Range Status   Specimen Description Blood BLOOD LEFT HAND   Final   Special Requests BOTTLES DRAWN AEROBIC AND ANAEROBIC 9CC   Final   Culture NO GROWTH <24 HRS   Final   Report Status PENDING   Incomplete  CULTURE, BLOOD (ROUTINE X 2)     Status: None   Collection Time    03/15/14  4:11 AM      Result Value Ref Range Status   Specimen Description Blood BLOOD RIGHT HAND   Final   Special Requests BOTTLES DRAWN AEROBIC ONLY 8CC   Final   Culture NO GROWTH <24 HRS   Final   Report Status PENDING   Incomplete   Medical History: Past Medical History  Diagnosis Date  .  Hyperlipidemia     takes Simvastatin nightly  . Seasonal allergies     takes Allegra daily  . Depression     takes Prozac at bedtime  . Diabetes mellitus without complication     takes Metformin daily  . Sinoatrial node dysfunction     has ICD  . Anemia     takes Folic Acid daily  . Arthritis     takes Plaquenil daily and Methotrexate 2 times a week  . Family history of anesthesia complication     sister post op N/V  . Pacemaker   . Cough     takes Allegra daily  . Joint pain   . Joint swelling   . History of UTI   . History of blood transfusion    Vancomycin 8/14>>8/15 Aztreonam 8/14 >> Levaquin 8/14 >>8/15  Assessment: 68yo female admitted with fever, n/v, and recent kidney infection.  Asked to initiate Aztreonam for suspected sepsis.  Vancomycin and Levaquin d/c'd. Blood and Urine cx's with E Coli sensitive to Rocephin.    Estimated Creatinine Clearance: 55.8 ml/min (by C-G formula based on Cr of 1.11).  03/16/2014 FINAL Organism ID, Bacteria ESCHERICHIA COLI  Culture & Susceptibility   ESCHERICHIA COLI    Antibiotic Sensitivity Microscan Status    AMPICILLIN Resistant >=32 RESISTANT Final    Method: MIC    AMPICILLIN/SULBACTAM Sensitive 4 SENSITIVE Final    Method: MIC    CEFAZOLIN Sensitive <=4 SENSITIVE Final    Method: MIC    CEFEPIME Sensitive <=1 SENSITIVE Final    Method: MIC    CEFTAZIDIME Sensitive <=1 SENSITIVE Final    Method: MIC    CEFTRIAXONE Sensitive <=1 SENSITIVE Final    Method: MIC    CIPROFLOXACIN Resistant >=4 RESISTANT Final    Method: MIC    GENTAMICIN Sensitive <=1 SENSITIVE Final    Method: MIC    IMIPENEM Sensitive <=0.25 SENSITIVE Final    Method: MIC    PIP/TAZO Sensitive <=4 SENSITIVE Final    Method: MIC    TOBRAMYCIN Sensitive <=1 SENSITIVE Final    Method: MIC    TRIMETH/SULFA Resistant >=320 RESISTANT Final    Method: MIC    Comments ESCHERICHIA COLI (MIC)   Goal of Therapy:  Eradicate infection.  Plan:  Continue  Aztreonam 1gm IV q8h for now Consider d/c Aztreonam and switch to Rocephin 2gm IV q24hrs Monitor labs, renal fxn, and cultures  Valrie Hart A 03/16/2014,8:49 AM

## 2014-03-17 LAB — GLUCOSE, CAPILLARY
Glucose-Capillary: 100 mg/dL — ABNORMAL HIGH (ref 70–99)
Glucose-Capillary: 110 mg/dL — ABNORMAL HIGH (ref 70–99)

## 2014-03-17 NOTE — Progress Notes (Signed)
Patient and family member received discharge instructions and had no questions or concerns.  Patient received first dose antibiotic prior to discharge.  PICC was flushed and locked after patient received antibiotic.   Patient will be receiving antibiotic through PICC at home with home health.  Patient was escorted to vehicle via wheelchair by nurse.

## 2014-03-17 NOTE — Progress Notes (Signed)
Ur review complete.  

## 2014-03-17 NOTE — Progress Notes (Signed)
Pt refused scheduled dose of heparin

## 2014-03-17 NOTE — Discharge Summary (Addendum)
Physician Discharge Summary  Tracey Koch WUJ:[id number] DOB: 10-08-1945 DOA: 03/13/2014  PCP: Louie Boston, MD  Admit date: 03/13/2014 Discharge date: 03/17/2014  Time spent: 35 minutes  Recommendations for Outpatient Follow-up:  1. Patient to receive Invanz 1 gram IV q daily for 7 days, anticipated stop date: Mar 24, 2014 2. Repeat BMP and CBC in 1 week 3. Discontinue PICC line once antibiotic course completed  Discharge Diagnoses:  Principal Problem:   Sepsis Active Problems:   UTI (urinary tract infection)   Diabetes   HYPERTENSION, UNSPECIFIED   ARTHRITIS, RHEUMATOID   PACEMAKER, PERMANENT   Discharge Condition: Stable/Improved  Diet recommendation: Heat Healthy  Filed Weights   03/13/14 1735 03/13/14 2135 03/14/14 0610  Weight: 90.719 kg (200 lb) 94.2 kg (207 lb 10.8 oz) 95.9 kg (211 lb 6.7 oz)    History of present illness:  Tracey Koch is a 68 y.o. female  This is a 68 year old lady who gives a 3 week history of dysuria, fever, nausea vomiting and rigors associated with a degree of confusion. She also has had lower abdominal pain and dysuria. She was seen at [facility] on 2 occasions. On the first occasion she was kept in the hospital for 3 days and on the second occasion she was discharged from the emergency room. She appears to have not improved despite this therapy. Today she once again had a fever and weakness. Evaluation in the emergency room shows urinalysis consistent with UTI and her clinical picture appears to be one of urosepsis. She is now being admitted for further management.   Hospital Course:  1. Sepsis -Present on admission, evidenced by positive blood cultures with lab reporting the growth of E.coli in the initial blood culture, temperature of of 102.8, HR of 105  -Source of infection likely to be Urinary Tract Infection.  -Blood cultures repeated on 03/15/2014, showing no growth in the last 48 hours  -Patient to be discharged on a  seven-day course of Invanz 1 g IV every 24 hours. Home health services providing IV home infusion -Please discontinue PICC line once antibiotic course completed 2. Urinary Tract Infection  -She has history of recurrent UTI's  -Urine culture from 05/06/2013 grew Ecoli, organism resistant to Ampicillin, Cipro, Levaquin, and Bactrim. It appears she may have been treated with Cipro in the outpatient setting  -Blood cultures from 03/13/2014 growing E coli having same susceptibility pattern from organism isolated form urine obtained on this hospitalization.  -Repeat blood cultures were obtained on 03/15/2014, showing no growth to date  -Case discussed with Dr Luciana Axe of infectious disease, recommending 7 more days of emperic antibiotic therapy with Invanz.  -Status post PICC line placement on 03/16/2014 -Home health services arranged for home IV antibiotic therapy 3. Diabetes Mellitus  -Blood sugars remain stable  -Continue Metformin 500 mg PO BID, accuchecks q ac and q hs with sliding scale coverage   Procedures:  Blood Cultures drawn on 03/13/2014 AMPICILLIN >=32 RESISTANT R Final AMPICILLIN/SULBACTAM 4 SENSITIVE S Final CEFAZOLIN <=4 SENSITIVE S Final CEFEPIME <=1 SENSITIVE S Final CEFTAZIDIME <=1 SENSITIVE S Final CEFTRIAXONE <=1 SENSITIVE S Final CIPROFLOXACIN >=4 RESISTANT R Final GENTAMICIN <=1 SENSITIVE S Final IMIPENEM <=0.25 SENSITIVE S Final PIP/TAZO <=4 SENSITIVE S Final TOBRAMYCIN <=1 SENSITIVE S Final TRIMETH/SULFA >=320 RESISTANT R Final   Urine Cultures drawn 03/13/2014 ESCHERICHIA COLI AMPICILLIN >=32 RESISTANT R Final CEFAZOLIN <=4 SENSITIVE S Final CEFTRIAXONE <=1 SENSITIVE S Final CIPROFLOXACIN >=4 RESISTANT R Final GENTAMICIN <=1 SENSITIVE S Final LEVOFLOXACIN >=8 RESISTANT R  Final NITROFURANTOIN <=16 SENSITIVE S Final PIP/TAZO <=4 SENSITIVE S Final TOBRAMYCIN <=1 SENSITIVE S Final TRIMETH/SULFA >=320 RESISTANT R Final  Consultations:  Infectious disease, telephone  consultation  Discharge Exam: Filed Vitals:   03/17/14 0745  BP: 107/47  Pulse: 77  Temp: 98.4 F (36.9 C)  Resp:    General: She is awake and alert, oriented x 3, following commands for me, states feeling better  Cardiovascular: Regular rate and rhythm, normal S1S2  Respiratory: Normal inspriatory effort, lungs are clear  Abdomen: Soft, nontender nondistened  Musculoskeletal: No edema   Discharge Instructions You were cared for by a hospitalist during your hospital stay. If you have any questions about your discharge medications or the care you received while you were in the hospital after you are discharged, you can call the unit and asked to speak with the hospitalist on call if the hospitalist that took care of you is not available. Once you are discharged, your primary care physician will handle any further medical issues. Please note that NO REFILLS for any discharge medications will be authorized once you are discharged, as it is imperative that you return to your primary care physician (or establish a relationship with a primary care physician if you do not have one) for your aftercare needs so that they can reassess your need for medications and monitor your lab values.  Discharge Instructions   Call MD for:  difficulty breathing, headache or visual disturbances    Complete by:  As directed      Call MD for:  extreme fatigue    Complete by:  As directed      Call MD for:  hives    Complete by:  As directed      Call MD for:  persistant dizziness or light-headedness    Complete by:  As directed      Call MD for:  persistant nausea and vomiting    Complete by:  As directed      Call MD for:  redness, tenderness, or signs of infection (pain, swelling, redness, odor or green/yellow discharge around incision site)    Complete by:  As directed      Call MD for:  severe uncontrolled pain    Complete by:  As directed      Call MD for:  temperature >100.4    Complete by:  As  directed      Diet - low sodium heart healthy    Complete by:  As directed      Increase activity slowly    Complete by:  As directed             Medication List    STOP taking these medications       cephALEXin 500 MG capsule  Commonly known as:  KEFLEX     ciprofloxacin 500 MG tablet  Commonly known as:  CIPRO      TAKE these medications       acetaminophen 500 MG tablet  Commonly known as:  TYLENOL  Take 1,000 mg by mouth every 6 (six) hours as needed for mild pain, moderate pain or fever.     fexofenadine-pseudoephedrine 180-240 MG per 24 hr tablet  Commonly known as:  ALLEGRA-D 24  Take 1 tablet by mouth daily.     hydroxychloroquine 200 MG tablet  Commonly known as:  PLAQUENIL  Take 400 mg by mouth daily.     metFORMIN 500 MG tablet  Commonly known as:  GLUCOPHAGE  Take 500 mg by mouth 2 (two) times daily with a meal.     simvastatin 20 MG tablet  Commonly known as:  ZOCOR  Take 20 mg by mouth every evening.     solifenacin 5 MG tablet  Commonly known as:  VESICARE  Take 5 mg by mouth daily.     traMADol 50 MG tablet  Commonly known as:  ULTRAM  Take 2 tablets (100 mg total) by mouth every 6 (six) hours as needed for pain.       Allergies  Allergen Reactions  . Codeine Itching, Swelling and Rash  . Levaquin [Levofloxacin In D5w] Itching, Swelling and Rash  . Penicillins Itching, Swelling and Rash  . Sulfa Drugs Cross Reactors Itching, Swelling and Rash       Follow-up Information   Follow up with [facility].   Contact information:   [address] [place] [address] [phone]       Follow up with TAPPER,DAVID B, MD In 1 week.   Specialty:  Family Medicine   Contact information:   [address] [place] [address] [phone]        The results of significant diagnostics from this hospitalization (including imaging, microbiology, ancillary and laboratory) are listed below for reference.     Significant Diagnostic Studies: Dg Chest 1 View  03/13/2014   CLINICAL DATA:  Fever and headache.  EXAM: CHEST - 1 VIEW  COMPARISON:  Single view of the chest 03/11/2014.  FINDINGS: Lungs are clear. Heart size is normal. No pneumothorax or pleural fluid. Pacing device again seen.  IMPRESSION: No acute disease.   Electronically Signed   By: Drusilla Kanner M.D.   On: 03/13/2014 18:32   Ct Head Wo Contrast  03/13/2014   CLINICAL DATA:  Fever, headache and confusion.  EXAM: CT HEAD WITHOUT CONTRAST  TECHNIQUE: Contiguous axial images were obtained from the base of the skull through the vertex without intravenous contrast.  COMPARISON:  10/30/2011.  FINDINGS: No evidence of an acute infarct, acute hemorrhage, mass lesion, mass effect or hydrocephalus. Mild atrophy. Mucosal thickening is seen in the ethmoid air cells. No air-fluid levels. Mastoid air cells are clear.  IMPRESSION: 1. No acute intracranial abnormality. 2. Mild atrophy. 3. Mucosal thickening in the ethmoid air cells.   Electronically Signed   By: Leanna Battles M.D.   On: 03/13/2014 18:32    Microbiology: Recent Results (from the past 240 hour(s))  CULTURE, BLOOD (ROUTINE X 2)     Status: None   Collection Time    03/13/14  5:49 PM      Result Value Ref Range Status   Specimen Description BLOOD RIGHT ANTECUBITAL   Final   Special Requests BOTTLES DRAWN AEROBIC AND ANAEROBIC 8CC   Final   Culture  Setup Time     Final   Value: 03/15/2014 02:29     Performed at [facility]   Culture     Final   Value: ESCHERICHIA COLI     Note: SUSCEPTIBILITIES PERFORMED ON PREVIOUS CULTURE WITHIN THE LAST 5 DAYS.     Note: Gram Stain Report Called to,Read Back By and Verified With: DICKERSON M AT 0725 BY GODFREY O ON 03/14/14  Performed at [facility]     Performed at [facility]   Report Status 03/16/2014 FINAL   Final  CULTURE, BLOOD (ROUTINE X 2)     Status: None   Collection Time  03/13/14  5:58 PM      Result  Value Ref Range Status   Specimen Description BLOOD LEFT HAND   Final   Special Requests BOTTLES DRAWN AEROBIC AND ANAEROBIC 8CC   Final   Culture  Setup Time     Final   Value: 03/14/2014 06:45     Performed at [facility]   Culture     Final   Value: ESCHERICHIA COLI     Note: Gram Stain Report Called to,Read Back By and Verified With: DICKERSON M AT 0725 BY GODFREY O ON 03/14/14 Performed at [facility]     Performed at [facility]   Report Status 03/16/2014 FINAL   Final   Organism ID, Bacteria ESCHERICHIA COLI   Final  URINE CULTURE     Status: None   Collection Time    03/13/14  6:11 PM      Result Value Ref Range Status   Specimen Description URINE, CATHETERIZED   Final   Special Requests NONE   Final   Culture  Setup Time     Final   Value: 03/14/2014 00:59     Performed at [employer] [facility]     Final   Value: >=100,000 COLONIES/ML     Performed at [facility]   Culture     Final   Value: ESCHERICHIA COLI     Performed at [facility]   Report Status 03/16/2014 FINAL   Final   Organism ID, Bacteria ESCHERICHIA COLI   Final  CULTURE, BLOOD (ROUTINE X 2)     Status: None   Collection Time    03/15/14  4:05 AM      Result Value Ref Range Status   Specimen Description BLOOD LEFT HAND   Final   Special Requests BOTTLES DRAWN AEROBIC AND ANAEROBIC 9CC   Final   Culture NO GROWTH 2 DAYS   Final   Report Status PENDING   Incomplete  CULTURE, BLOOD (ROUTINE X 2)     Status: None   Collection Time    03/15/14  4:11 AM      Result Value Ref Range Status   Specimen Description BLOOD RIGHT HAND   Final   Special Requests BOTTLES DRAWN AEROBIC ONLY 8CC   Final   Culture NO GROWTH 2 DAYS   Final   Report Status PENDING   Incomplete     Labs: Basic Metabolic Panel:  Recent Labs Lab 03/13/14 1749 03/14/14 0556  NA 136* 138  K 3.7 4.2  CL 102 105  CO2 20 21  GLUCOSE 118* 138*  BUN 17 16  CREATININE 1.17*  1.11*  CALCIUM 8.0* 7.8*   Liver Function Tests:  Recent Labs Lab 03/13/14 1749 03/14/14 0556  AST 25 22  ALT 17 16  ALKPHOS 143* 139*  BILITOT 1.0 1.1  PROT 6.3 6.1  ALBUMIN 2.6* 2.4*    Recent Labs Lab 03/13/14 1749  LIPASE 25   No results found for this basename: AMMONIA,  in the last 168 hours CBC:  Recent Labs Lab 03/13/14 1749 03/14/14 0556 03/16/14 0626  WBC 12.3* 10.5 9.1  NEUTROABS 10.1*  --   --   HGB 12.3 11.9* 11.2*  HCT 35.7* 35.3* 33.6*  MCV 91.8 92.4 91.1  PLT 136* 134* 136*   Cardiac Enzymes:  Recent Labs Lab 03/13/14 1749  TROPONINI <0.30   BNP: BNP (last 3 results) No results found for this basename: PROBNP,  in the last 8760 hours CBG:  Recent Labs Lab 03/16/14 0749 03/16/14 1146 03/16/14 1618 03/16/14 2134 03/17/14 0723  GLUCAP 126* 133* 117* 115* 100*       Signed:  Kennadi Albany  [facility] 03/17/2014, 11:59 AM

## 2014-03-20 LAB — CULTURE, BLOOD (ROUTINE X 2)
CULTURE: NO GROWTH
Culture: NO GROWTH

## 2014-05-25 ENCOUNTER — Ambulatory Visit (INDEPENDENT_AMBULATORY_CARE_PROVIDER_SITE_OTHER): Payer: Medicare Other | Admitting: *Deleted

## 2014-05-25 DIAGNOSIS — I495 Sick sinus syndrome: Secondary | ICD-10-CM

## 2014-05-25 NOTE — Progress Notes (Signed)
Remote pacemaker transmission.   

## 2014-05-27 LAB — MDC_IDC_ENUM_SESS_TYPE_REMOTE
Battery Impedance: 3091 Ohm
Battery Remaining Longevity: 17 mo
Battery Voltage: 2.7 V
Brady Statistic AP VP Percent: 0 %
Brady Statistic AS VP Percent: 0 %
Lead Channel Pacing Threshold Amplitude: 0.5 V
Lead Channel Pacing Threshold Amplitude: 0.625 V
Lead Channel Pacing Threshold Pulse Width: 0.4 ms
Lead Channel Sensing Intrinsic Amplitude: 1.4 mV
Lead Channel Setting Pacing Amplitude: 1.5 V
Lead Channel Setting Pacing Amplitude: 2 V
Lead Channel Setting Pacing Pulse Width: 0.4 ms
MDC IDC MSMT LEADCHNL RA IMPEDANCE VALUE: 561 Ohm
MDC IDC MSMT LEADCHNL RV IMPEDANCE VALUE: 761 Ohm
MDC IDC MSMT LEADCHNL RV PACING THRESHOLD PULSEWIDTH: 0.4 ms
MDC IDC MSMT LEADCHNL RV SENSING INTR AMPL: 16 mV
MDC IDC SESS DTM: 20151026151704
MDC IDC SET LEADCHNL RV SENSING SENSITIVITY: 5.6 mV
MDC IDC STAT BRADY AP VS PERCENT: 3 %
MDC IDC STAT BRADY AS VS PERCENT: 97 %

## 2014-06-18 ENCOUNTER — Encounter: Payer: Self-pay | Admitting: Cardiology

## 2014-06-29 ENCOUNTER — Encounter: Payer: Self-pay | Admitting: Internal Medicine

## 2014-09-18 ENCOUNTER — Ambulatory Visit (INDEPENDENT_AMBULATORY_CARE_PROVIDER_SITE_OTHER): Payer: Medicare Other | Admitting: Internal Medicine

## 2014-09-18 ENCOUNTER — Encounter: Payer: Self-pay | Admitting: Internal Medicine

## 2014-09-18 VITALS — BP 120/66 | HR 80 | Ht 65.0 in | Wt 196.0 lb

## 2014-09-18 DIAGNOSIS — Z720 Tobacco use: Secondary | ICD-10-CM

## 2014-09-18 DIAGNOSIS — Z950 Presence of cardiac pacemaker: Secondary | ICD-10-CM

## 2014-09-18 DIAGNOSIS — I495 Sick sinus syndrome: Secondary | ICD-10-CM

## 2014-09-18 DIAGNOSIS — Z136 Encounter for screening for cardiovascular disorders: Secondary | ICD-10-CM

## 2014-09-18 LAB — MDC_IDC_ENUM_SESS_TYPE_INCLINIC
Battery Remaining Longevity: 14 mo
Brady Statistic AP VP Percent: 0 %
Brady Statistic AP VS Percent: 2 %
Brady Statistic AS VP Percent: 0 %
Brady Statistic AS VS Percent: 97 %
Date Time Interrogation Session: 20160219100314
Lead Channel Impedance Value: 533 Ohm
Lead Channel Pacing Threshold Amplitude: 0.5 V
Lead Channel Pacing Threshold Amplitude: 0.75 V
Lead Channel Pacing Threshold Pulse Width: 0.4 ms
Lead Channel Pacing Threshold Pulse Width: 0.4 ms
Lead Channel Setting Pacing Amplitude: 1.5 V
Lead Channel Setting Pacing Amplitude: 2 V
Lead Channel Setting Pacing Pulse Width: 0.4 ms
Lead Channel Setting Sensing Sensitivity: 5.6 mV
MDC IDC MSMT BATTERY IMPEDANCE: 3537 Ohm
MDC IDC MSMT BATTERY VOLTAGE: 2.68 V
MDC IDC MSMT LEADCHNL RA SENSING INTR AMPL: 2.8 mV
MDC IDC MSMT LEADCHNL RV IMPEDANCE VALUE: 725 Ohm
MDC IDC MSMT LEADCHNL RV SENSING INTR AMPL: 22.4 mV

## 2014-09-18 LAB — PACEMAKER DEVICE OBSERVATION

## 2014-09-18 NOTE — Assessment & Plan Note (Signed)
i discussed the importance of smoking cessation. She will try and reduce her tobacco use.

## 2014-09-18 NOTE — Assessment & Plan Note (Signed)
Her medtronic DDD device is working normally. She is approx. A year from ERI.

## 2014-09-18 NOTE — Progress Notes (Signed)
HPI Mrs. Viscardi returns today for followup. She is a pleasant 69 yo woman with a h/o symptomatic bradycardia, s/p PPM, HTN, arthritis, and DM. In the interim, she has been stable. No chest pain or sob. No syncope. She still has pain in her left knee after knee replacement 16 months ago. Her pain however is improved but remains. Allergies  Allergen Reactions  . Codeine Itching, Swelling and Rash  . Levaquin [Levofloxacin In D5w] Itching, Swelling and Rash  . Penicillins Itching, Swelling and Rash  . Sulfa Drugs Cross Reactors Itching, Swelling and Rash     Current Outpatient Prescriptions  Medication Sig Dispense Refill  . acetaminophen (TYLENOL) 500 MG tablet Take 1,000 mg by mouth every 6 (six) hours as needed for mild pain, moderate pain or fever.     . fexofenadine-pseudoephedrine (ALLEGRA-D 24) 180-240 MG per 24 hr tablet Take 1 tablet by mouth daily.    Marland Kitchen HYDROcodone-acetaminophen (NORCO/VICODIN) 5-325 MG per tablet Take 1 tablet by mouth every 6 (six) hours as needed for moderate pain.    . metFORMIN (GLUCOPHAGE) 500 MG tablet Take 500 mg by mouth 2 (two) times daily with a meal.     . simvastatin (ZOCOR) 20 MG tablet Take 20 mg by mouth every evening.    . solifenacin (VESICARE) 5 MG tablet Take 5 mg by mouth daily.    . traZODone (DESYREL) 150 MG tablet Take 150 mg by mouth at bedtime.     No current facility-administered medications for this visit.     Past Medical History  Diagnosis Date  . Hyperlipidemia     takes Simvastatin nightly  . Seasonal allergies     takes Allegra daily  . Depression     takes Prozac at bedtime  . Diabetes mellitus without complication     takes Metformin daily  . Sinoatrial node dysfunction     has ICD  . Anemia     takes Folic Acid daily  . Arthritis     takes Plaquenil daily and Methotrexate 2 times a week  . Family history of anesthesia complication     sister post op N/V  . Pacemaker   . Cough     takes Allegra daily  . Joint  pain   . Joint swelling   . History of UTI   . History of blood transfusion     ROS:   All systems reviewed and negative except as noted in the HPI.   Past Surgical History  Procedure Laterality Date  . Abdominal hysterectomy    . Insert / replace / remove pacemaker    . Cholecystectomy    . Colonoscopy    . Right knee replacement    . Total knee arthroplasty Left 05/06/2013    Dr Cleophas Dunker  . Total knee arthroplasty Left 05/06/2013    Procedure: TOTAL KNEE ARTHROPLASTY;  Surgeon: [name], MD;  Location: [facility] OR;  Service: Orthopedics;  Laterality: Left;     No family history on file.   History   Social History  . Marital Status: Married    Spouse Name: N/A  . Number of Children: N/A  . Years of Education: N/A   Occupational History  . Not on file.   Social History Main Topics  . Smoking status: Former Smoker -- 1.00 packs/day for 52 years    Types: Cigarettes    Start date: 03/01/1962    Quit date: 02/27/2014  . Smokeless tobacco: Never Used  . Alcohol Use: No  .  Drug Use: No  . Sexual Activity: Not Currently    Birth Control/ Protection: Surgical   Other Topics Concern  . Not on file   Social History Narrative     BP 120/66 mmHg  Pulse 80  Ht 5\' 5"  (1.651 m)  Wt 196 lb (88.905 kg)  BMI 32.62 kg/m2  SpO2 96%  Physical Exam:  Well appearing 69 yo woman, NAD HEENT: Unremarkable Neck:  7 cm JVD, no thyromegally Lungs:  Clear with no wheezes, rales, or rhonchi HEART:  Regular rate rhythm, no murmurs, no rubs, no clicks Abd:  soft, positive bowel sounds, no organomegally, no rebound, no guarding Ext:  2 plus pulses, no edema, no cyanosis, no clubbing Skin:  No rashes no nodules Neuro:  CN II through XII intact, motor grossly intact  DEVICE  Normal device function.  See PaceArt for details.   Assess/Plan:

## 2014-09-18 NOTE — Patient Instructions (Addendum)
Your physician wants you to follow-up in: 1 year with Dr. Ladona Ridgel. You will receive a reminder letter in the mail two months in advance. If you don't receive a letter, please call our office to schedule the follow-up appointment.  Remote monitoring is used to monitor your Pacemaker of ICD from home. This monitoring reduces the number of office visits required to check your device to one time per year. It allows [place] to keep an eye on the functioning of your device to ensure it is working properly. You are scheduled for a device check from home on 10/19/14. You may send your transmission at any time that day. If you have a wireless device, the transmission will be sent automatically. After your physician reviews your transmission, you will receive a postcard with your next transmission date.  Your physician recommends that you continue on your current medications as directed. Please refer to the Current Medication list given to you today.  Thank you for choosing [facility]!

## 2014-09-18 NOTE — Assessment & Plan Note (Signed)
Her blood pressure is well controlled. No change in meds. 

## 2014-09-21 ENCOUNTER — Encounter: Payer: Self-pay | Admitting: Internal Medicine

## 2014-10-19 ENCOUNTER — Ambulatory Visit (INDEPENDENT_AMBULATORY_CARE_PROVIDER_SITE_OTHER): Payer: Medicare Other | Admitting: *Deleted

## 2014-10-19 DIAGNOSIS — Z950 Presence of cardiac pacemaker: Secondary | ICD-10-CM

## 2014-10-19 LAB — MDC_IDC_ENUM_SESS_TYPE_REMOTE
Battery Impedance: 3832 Ohm
Battery Remaining Longevity: 12 mo
Battery Voltage: 2.67 V
Brady Statistic AP VP Percent: 0 %
Brady Statistic AS VP Percent: 0 %
Brady Statistic AS VS Percent: 98 %
Date Time Interrogation Session: 20160321140846
Lead Channel Impedance Value: 608 Ohm
Lead Channel Impedance Value: 654 Ohm
Lead Channel Pacing Threshold Amplitude: 0.5 V
Lead Channel Pacing Threshold Pulse Width: 0.4 ms
Lead Channel Sensing Intrinsic Amplitude: 16 mV
Lead Channel Setting Pacing Amplitude: 2 V
Lead Channel Setting Pacing Pulse Width: 0.4 ms
MDC IDC MSMT LEADCHNL RA SENSING INTR AMPL: 1.4 mV
MDC IDC MSMT LEADCHNL RV PACING THRESHOLD AMPLITUDE: 0.75 V
MDC IDC MSMT LEADCHNL RV PACING THRESHOLD PULSEWIDTH: 0.4 ms
MDC IDC SET LEADCHNL RA PACING AMPLITUDE: 1.5 V
MDC IDC SET LEADCHNL RV SENSING SENSITIVITY: 5.6 mV
MDC IDC STAT BRADY AP VS PERCENT: 1 %

## 2014-10-19 NOTE — Progress Notes (Signed)
Remote pacemaker transmission.   

## 2014-11-04 ENCOUNTER — Encounter: Payer: Self-pay | Admitting: Cardiology

## 2014-11-10 ENCOUNTER — Encounter: Payer: Self-pay | Admitting: Internal Medicine

## 2014-11-19 ENCOUNTER — Ambulatory Visit (INDEPENDENT_AMBULATORY_CARE_PROVIDER_SITE_OTHER): Payer: Medicare Other | Admitting: *Deleted

## 2014-11-19 DIAGNOSIS — I495 Sick sinus syndrome: Secondary | ICD-10-CM

## 2014-11-19 NOTE — Progress Notes (Signed)
Remote pacemaker transmission.   

## 2014-11-22 LAB — MDC_IDC_ENUM_SESS_TYPE_REMOTE
Battery Impedance: 4124 Ohm
Battery Remaining Longevity: 11 mo
Brady Statistic AP VP Percent: 0 %
Brady Statistic AS VP Percent: 0 %
Brady Statistic AS VS Percent: 98 %
Date Time Interrogation Session: 20160421140630
Lead Channel Impedance Value: 754 Ohm
Lead Channel Pacing Threshold Amplitude: 0.625 V
Lead Channel Pacing Threshold Amplitude: 0.75 V
Lead Channel Pacing Threshold Pulse Width: 0.4 ms
Lead Channel Pacing Threshold Pulse Width: 0.4 ms
Lead Channel Setting Pacing Amplitude: 2 V
Lead Channel Setting Pacing Pulse Width: 0.4 ms
Lead Channel Setting Sensing Sensitivity: 5.6 mV
MDC IDC MSMT BATTERY VOLTAGE: 2.66 V
MDC IDC MSMT LEADCHNL RA IMPEDANCE VALUE: 526 Ohm
MDC IDC MSMT LEADCHNL RA SENSING INTR AMPL: 2.8 mV
MDC IDC MSMT LEADCHNL RV SENSING INTR AMPL: 16 mV
MDC IDC SET LEADCHNL RA PACING AMPLITUDE: 1.5 V
MDC IDC STAT BRADY AP VS PERCENT: 1 %

## 2014-11-27 ENCOUNTER — Encounter: Payer: Self-pay | Admitting: Cardiology

## 2014-12-04 ENCOUNTER — Encounter: Payer: Self-pay | Admitting: Internal Medicine

## 2014-12-21 ENCOUNTER — Ambulatory Visit (INDEPENDENT_AMBULATORY_CARE_PROVIDER_SITE_OTHER): Payer: Medicare Other | Admitting: *Deleted

## 2014-12-21 ENCOUNTER — Encounter: Payer: Medicare Other | Admitting: *Deleted

## 2014-12-21 DIAGNOSIS — I495 Sick sinus syndrome: Secondary | ICD-10-CM

## 2014-12-21 NOTE — Progress Notes (Signed)
Remote pacemaker transmission.   

## 2014-12-22 LAB — CUP PACEART REMOTE DEVICE CHECK
Battery Impedance: 4354 Ohm
Battery Remaining Longevity: 9 mo
Battery Voltage: 2.66 V
Brady Statistic AS VP Percent: 0 %
Brady Statistic AS VS Percent: 99 %
Date Time Interrogation Session: 20160523134302
Lead Channel Impedance Value: 585 Ohm
Lead Channel Pacing Threshold Amplitude: 0.625 V
Lead Channel Pacing Threshold Pulse Width: 0.4 ms
Lead Channel Pacing Threshold Pulse Width: 0.4 ms
Lead Channel Sensing Intrinsic Amplitude: 16 mV
Lead Channel Setting Pacing Amplitude: 2 V
Lead Channel Setting Pacing Pulse Width: 0.4 ms
Lead Channel Setting Sensing Sensitivity: 5.6 mV
MDC IDC MSMT LEADCHNL RA SENSING INTR AMPL: 1.4 mV
MDC IDC MSMT LEADCHNL RV IMPEDANCE VALUE: 766 Ohm
MDC IDC MSMT LEADCHNL RV PACING THRESHOLD AMPLITUDE: 0.75 V
MDC IDC SET LEADCHNL RA PACING AMPLITUDE: 1.5 V
MDC IDC STAT BRADY AP VP PERCENT: 0 %
MDC IDC STAT BRADY AP VS PERCENT: 1 %

## 2014-12-30 ENCOUNTER — Encounter: Payer: Self-pay | Admitting: Cardiology

## 2015-01-06 ENCOUNTER — Encounter: Payer: Self-pay | Admitting: Internal Medicine

## 2015-01-21 ENCOUNTER — Ambulatory Visit (INDEPENDENT_AMBULATORY_CARE_PROVIDER_SITE_OTHER): Payer: Medicare Other | Admitting: *Deleted

## 2015-01-21 DIAGNOSIS — Z950 Presence of cardiac pacemaker: Secondary | ICD-10-CM

## 2015-01-21 NOTE — Progress Notes (Signed)
Remote pacemaker transmission.   

## 2015-01-22 LAB — CUP PACEART REMOTE DEVICE CHECK
Battery Voltage: 2.65 V
Brady Statistic AP VS Percent: 1 %
Brady Statistic AS VP Percent: 0 %
Brady Statistic AS VS Percent: 98 %
Date Time Interrogation Session: 20160623144824
Lead Channel Impedance Value: 550 Ohm
Lead Channel Impedance Value: 733 Ohm
Lead Channel Setting Pacing Amplitude: 2 V
Lead Channel Setting Pacing Pulse Width: 0.4 ms
Lead Channel Setting Sensing Sensitivity: 5.6 mV
MDC IDC MSMT BATTERY IMPEDANCE: 4513 Ohm
MDC IDC MSMT BATTERY REMAINING LONGEVITY: 9 mo
MDC IDC SET LEADCHNL RA PACING AMPLITUDE: 1.5 V
MDC IDC STAT BRADY AP VP PERCENT: 0 %

## 2015-02-02 ENCOUNTER — Encounter: Payer: Self-pay | Admitting: Cardiology

## 2015-02-05 ENCOUNTER — Encounter: Payer: Self-pay | Admitting: Internal Medicine

## 2015-02-22 ENCOUNTER — Ambulatory Visit (INDEPENDENT_AMBULATORY_CARE_PROVIDER_SITE_OTHER): Payer: Medicare Other | Admitting: *Deleted

## 2015-02-22 DIAGNOSIS — I495 Sick sinus syndrome: Secondary | ICD-10-CM

## 2015-02-22 NOTE — Progress Notes (Signed)
Battery check only 

## 2015-03-02 LAB — CUP PACEART REMOTE DEVICE CHECK
Battery Remaining Longevity: 7 mo
Battery Voltage: 2.64 V
Brady Statistic AP VS Percent: 1 %
Brady Statistic AS VP Percent: 0 %
Date Time Interrogation Session: 20160725162634
Lead Channel Impedance Value: 585 Ohm
Lead Channel Setting Pacing Amplitude: 1.5 V
Lead Channel Setting Pacing Amplitude: 2 V
Lead Channel Setting Pacing Pulse Width: 0.4 ms
MDC IDC MSMT BATTERY IMPEDANCE: 4877 Ohm
MDC IDC MSMT LEADCHNL RV IMPEDANCE VALUE: 721 Ohm
MDC IDC SET LEADCHNL RV SENSING SENSITIVITY: 5.6 mV
MDC IDC STAT BRADY AP VP PERCENT: 0 %
MDC IDC STAT BRADY AS VS PERCENT: 98 %

## 2015-03-02 NOTE — Progress Notes (Signed)
Battery check only, no charge, estimated longevity 7 months. Continue montly [facility] checks

## 2015-03-23 ENCOUNTER — Telehealth: Payer: Self-pay | Admitting: Cardiology

## 2015-03-23 ENCOUNTER — Ambulatory Visit (INDEPENDENT_AMBULATORY_CARE_PROVIDER_SITE_OTHER): Payer: Medicare Other | Admitting: *Deleted

## 2015-03-23 DIAGNOSIS — I495 Sick sinus syndrome: Secondary | ICD-10-CM | POA: Diagnosis not present

## 2015-03-23 NOTE — Progress Notes (Signed)
Remote pacemaker transmission.   

## 2015-03-23 NOTE — Telephone Encounter (Signed)
Attempted to confirm remote transmission with pt. No answer and was unable to leave a message.   

## 2015-03-30 LAB — CUP PACEART REMOTE DEVICE CHECK
Battery Voltage: 2.62 V
Brady Statistic AS VS Percent: 99 %
Date Time Interrogation Session: 20160823143143
Lead Channel Impedance Value: 571 Ohm
Lead Channel Pacing Threshold Amplitude: 0.5 V
Lead Channel Pacing Threshold Amplitude: 0.5 V
Lead Channel Sensing Intrinsic Amplitude: 1.4 mV
Lead Channel Sensing Intrinsic Amplitude: 16 mV
Lead Channel Setting Pacing Amplitude: 2 V
MDC IDC MSMT BATTERY IMPEDANCE: 5360 Ohm
MDC IDC MSMT BATTERY REMAINING LONGEVITY: 5 mo
MDC IDC MSMT LEADCHNL RA PACING THRESHOLD PULSEWIDTH: 0.4 ms
MDC IDC MSMT LEADCHNL RV IMPEDANCE VALUE: 753 Ohm
MDC IDC MSMT LEADCHNL RV PACING THRESHOLD PULSEWIDTH: 0.4 ms
MDC IDC SET LEADCHNL RA PACING AMPLITUDE: 1.5 V
MDC IDC SET LEADCHNL RV PACING PULSEWIDTH: 0.4 ms
MDC IDC SET LEADCHNL RV SENSING SENSITIVITY: 5.6 mV
MDC IDC STAT BRADY AP VP PERCENT: 0 %
MDC IDC STAT BRADY AP VS PERCENT: 1 %
MDC IDC STAT BRADY AS VP PERCENT: 0 %

## 2015-04-01 ENCOUNTER — Encounter: Payer: Self-pay | Admitting: Cardiology

## 2015-04-14 ENCOUNTER — Encounter: Payer: Self-pay | Admitting: Cardiology

## 2015-04-15 ENCOUNTER — Encounter: Payer: Self-pay | Admitting: Internal Medicine

## 2015-04-22 ENCOUNTER — Encounter: Payer: Self-pay | Admitting: Internal Medicine

## 2015-04-26 ENCOUNTER — Ambulatory Visit (INDEPENDENT_AMBULATORY_CARE_PROVIDER_SITE_OTHER): Payer: Medicare Other | Admitting: *Deleted

## 2015-04-26 DIAGNOSIS — Z950 Presence of cardiac pacemaker: Secondary | ICD-10-CM

## 2015-04-27 LAB — CUP PACEART REMOTE DEVICE CHECK
Battery Impedance: 6055 Ohm
Battery Voltage: 2.6 V
Brady Statistic AP VS Percent: 1 %
Brady Statistic AS VP Percent: 0 %
Brady Statistic AS VS Percent: 99 %
Date Time Interrogation Session: 20160926133334
Lead Channel Impedance Value: 598 Ohm
Lead Channel Impedance Value: 784 Ohm
Lead Channel Setting Pacing Amplitude: 1.5 V
Lead Channel Setting Pacing Amplitude: 2 V
MDC IDC MSMT BATTERY REMAINING LONGEVITY: 2 mo
MDC IDC SET LEADCHNL RV PACING PULSEWIDTH: 0.4 ms
MDC IDC SET LEADCHNL RV SENSING SENSITIVITY: 5.6 mV
MDC IDC STAT BRADY AP VP PERCENT: 0 %

## 2015-04-27 NOTE — Progress Notes (Signed)
Remote pacemaker transmission.   

## 2015-04-30 ENCOUNTER — Encounter: Payer: Self-pay | Admitting: Cardiology

## 2015-05-17 ENCOUNTER — Encounter: Payer: Self-pay | Admitting: Internal Medicine

## 2015-05-24 ENCOUNTER — Encounter: Payer: Self-pay | Admitting: Internal Medicine

## 2015-05-24 ENCOUNTER — Ambulatory Visit (INDEPENDENT_AMBULATORY_CARE_PROVIDER_SITE_OTHER): Payer: Medicare Other | Admitting: *Deleted

## 2015-05-24 DIAGNOSIS — Z950 Presence of cardiac pacemaker: Secondary | ICD-10-CM

## 2015-05-25 NOTE — Progress Notes (Signed)
Remote pacemaker transmission.   

## 2015-05-28 ENCOUNTER — Telehealth: Payer: Self-pay | Admitting: *Deleted

## 2015-05-28 LAB — CUP PACEART REMOTE DEVICE CHECK
Battery Impedance: 6775 Ohm
Brady Statistic RV Percent Paced: 0 %
Implantable Lead Implant Date: 20061110
Implantable Lead Implant Date: 20061110
Implantable Lead Location: 753859
Implantable Lead Location: 753860
Lead Channel Impedance Value: 587 Ohm
Lead Channel Setting Pacing Amplitude: 2 V
Lead Channel Setting Pacing Pulse Width: 0.4 ms
Lead Channel Setting Sensing Sensitivity: 5.6 mV
MDC IDC MSMT BATTERY REMAINING LONGEVITY: 4 mo
MDC IDC MSMT BATTERY VOLTAGE: 2.62 V
MDC IDC MSMT LEADCHNL RA IMPEDANCE VALUE: 67 Ohm
MDC IDC SESS DTM: 20161024124555

## 2015-05-28 NOTE — Telephone Encounter (Signed)
Informed patient that device reached ERI on 05/09/15. Patient will follow up w/GT/R first available to discuss gen change.Will defer scheduling to [facility]. Patient voiced understanding.

## 2015-07-12 ENCOUNTER — Ambulatory Visit (INDEPENDENT_AMBULATORY_CARE_PROVIDER_SITE_OTHER): Payer: Medicare Other | Admitting: Internal Medicine

## 2015-07-12 ENCOUNTER — Encounter: Payer: Self-pay | Admitting: *Deleted

## 2015-07-12 ENCOUNTER — Encounter: Payer: Self-pay | Admitting: Internal Medicine

## 2015-07-12 VITALS — BP 128/74 | HR 95 | Ht 65.0 in | Wt 188.2 lb

## 2015-07-12 DIAGNOSIS — Z950 Presence of cardiac pacemaker: Secondary | ICD-10-CM | POA: Diagnosis not present

## 2015-07-12 DIAGNOSIS — Z720 Tobacco use: Secondary | ICD-10-CM

## 2015-07-12 NOTE — Progress Notes (Signed)
HPI Tracey Koch returns today for followup. She is a pleasant 69 yo woman with a h/o symptomatic bradycardia, s/p PPM, HTN, arthritis, and DM. In the interim, she has been stable. No chest pain or sob. No syncope. Her knee pain has resolved but she still has some pain in the shoulders and arms. She notes fleeting twinges of chest pain which last a second or two and are not associated with exertion of physical activity. Allergies  Allergen Reactions  . Codeine Itching, Swelling and Rash  . Levaquin [Levofloxacin In D5w] Itching, Swelling and Rash  . Penicillins Itching, Swelling and Rash  . Sulfa Drugs Cross Reactors Itching, Swelling and Rash     Current Outpatient Prescriptions  Medication Sig Dispense Refill  . acetaminophen (TYLENOL) 500 MG tablet Take 1,000 mg by mouth every 6 (six) hours as needed for mild pain, moderate pain or fever.     Marland Kitchen HYDROcodone-acetaminophen (NORCO/VICODIN) 5-325 MG per tablet Take 1 tablet by mouth every 6 (six) hours as needed for moderate pain.    . metFORMIN (GLUCOPHAGE) 500 MG tablet Take 500 mg by mouth 2 (two) times daily with a meal.     . omega-3 acid ethyl esters (LOVAZA) 1 G capsule Take by mouth 2 (two) times daily.    . simvastatin (ZOCOR) 20 MG tablet Take 20 mg by mouth every evening.    . solifenacin (VESICARE) 5 MG tablet Take 5 mg by mouth daily.    . traZODone (DESYREL) 150 MG tablet Take 150 mg by mouth at bedtime.     No current facility-administered medications for this visit.     Past Medical History  Diagnosis Date  . Hyperlipidemia     takes Simvastatin nightly  . Seasonal allergies     takes Allegra daily  . Depression     takes Prozac at bedtime  . Diabetes mellitus without complication (HCC)     takes Metformin daily  . Sinoatrial node dysfunction (HCC)     has ICD  . Anemia     takes Folic Acid daily  . Arthritis     takes Plaquenil daily and Methotrexate 2 times a week  . Family history of anesthesia complication      sister post op N/V  . Pacemaker   . Cough     takes Allegra daily  . Joint pain   . Joint swelling   . History of UTI   . History of blood transfusion     ROS:   All systems reviewed and negative except as noted in the HPI.   Past Surgical History  Procedure Laterality Date  . Abdominal hysterectomy    . Insert / replace / remove pacemaker    . Cholecystectomy    . Colonoscopy    . Right knee replacement    . Total knee arthroplasty Left 05/06/2013    Dr Cleophas Dunker  . Total knee arthroplasty Left 05/06/2013    Procedure: TOTAL KNEE ARTHROPLASTY;  Surgeon: [name], MD;  Location: [facility] OR;  Service: Orthopedics;  Laterality: Left;     No family history on file.   Social History   Social History  . Marital Status: Married    Spouse Name: N/A  . Number of Children: N/A  . Years of Education: N/A   Occupational History  . Not on file.   Social History Main Topics  . Smoking status: Former Smoker -- 1.00 packs/day for 52 years    Types: Cigarettes  Start date: 03/01/1962    Quit date: 02/27/2014  . Smokeless tobacco: Never Used  . Alcohol Use: No  . Drug Use: No  . Sexual Activity: Not Currently    Birth Control/ Protection: Surgical   Other Topics Concern  . Not on file   Social History Narrative     BP 128/74 mmHg  Pulse 95  Ht 5' 5" (1.651 m)  Wt 188 lb 3.2 oz (85.367 kg)  BMI 31.32 kg/m2  SpO2 97%  Physical Exam:  Well appearing 69 yo woman, NAD HEENT: Unremarkable Neck:  7 cm JVD, no thyromegally Lungs:  Clear with no wheezes, rales, or rhonchi HEART:  Regular rate rhythm, no murmurs, no rubs, no clicks Abd:  soft, positive bowel sounds, no organomegally, no rebound, no guarding Ext:  2 plus pulses, no edema, no cyanosis, no clubbing Skin:  No rashes no nodules Neuro:  CN II through XII intact, motor grossly intact  DEVICE  Normal device function.  See PaceArt for details. She is at [facility].  Assess/Plan:  

## 2015-07-12 NOTE — Patient Instructions (Signed)
Your physician has recommended that you have a pacemaker inserted. A pacemaker is a small device that is placed under the skin of your chest or abdomen to help control abnormal heart rhythms. This device uses electrical pulses to prompt the heart to beat at a normal rate. Pacemakers are used to treat heart rhythms that are too slow. Wire (leads) are attached to the pacemaker that goes into the chambers of you heart. This is done in the hospital and usually requires and overnight stay. Please see the instruction sheet given to you today for more information.  Your physician recommends that you continue on your current medications as directed. Please refer to the Current Medication list given to you today.  If you need a refill on your cardiac medications before your next appointment, please call your pharmacy.  Thank you for choosing [facility]!     

## 2015-07-12 NOTE — Assessment & Plan Note (Signed)
Her DDD PM has reached ERI and has reverted to VVI mode. We will have her in for PM generator change out in the next few weeks.

## 2015-07-12 NOTE — Assessment & Plan Note (Signed)
She is still smoking a ppd. I have asked her to stop.

## 2015-08-05 ENCOUNTER — Telehealth: Payer: Self-pay | Admitting: Internal Medicine

## 2015-08-05 NOTE — Telephone Encounter (Signed)
Error. Pt called states. Wrong call from pt//shanti

## 2015-08-06 ENCOUNTER — Ambulatory Visit (HOSPITAL_COMMUNITY)
Admission: RE | Admit: 2015-08-06 | Discharge: 2015-08-06 | Disposition: A | Discharge status: Home / Self Care | Payer: Medicare Other | Source: Ambulatory Visit | Attending: Internal Medicine | Admitting: Internal Medicine

## 2015-08-06 ENCOUNTER — Encounter (HOSPITAL_COMMUNITY)
Admission: RE | Disposition: A | Discharge status: Home / Self Care | Payer: Self-pay | Source: Ambulatory Visit | Attending: Internal Medicine

## 2015-08-06 ENCOUNTER — Ambulatory Visit (HOSPITAL_COMMUNITY): Admit: 2015-08-06 | Payer: Self-pay | Admitting: Internal Medicine

## 2015-08-06 DIAGNOSIS — I495 Sick sinus syndrome: Secondary | ICD-10-CM | POA: Diagnosis not present

## 2015-08-06 DIAGNOSIS — Z880 Allergy status to penicillin: Secondary | ICD-10-CM | POA: Diagnosis not present

## 2015-08-06 DIAGNOSIS — Z539 Procedure and treatment not carried out, unspecified reason: Secondary | ICD-10-CM | POA: Diagnosis present

## 2015-08-06 DIAGNOSIS — Z950 Presence of cardiac pacemaker: Secondary | ICD-10-CM | POA: Diagnosis not present

## 2015-08-06 DIAGNOSIS — M1990 Unspecified osteoarthritis, unspecified site: Secondary | ICD-10-CM | POA: Insufficient documentation

## 2015-08-06 DIAGNOSIS — E119 Type 2 diabetes mellitus without complications: Secondary | ICD-10-CM | POA: Insufficient documentation

## 2015-08-06 DIAGNOSIS — Z882 Allergy status to sulfonamides status: Secondary | ICD-10-CM | POA: Diagnosis not present

## 2015-08-06 DIAGNOSIS — Z87891 Personal history of nicotine dependence: Secondary | ICD-10-CM | POA: Insufficient documentation

## 2015-08-06 DIAGNOSIS — Z885 Allergy status to narcotic agent status: Secondary | ICD-10-CM | POA: Insufficient documentation

## 2015-08-06 DIAGNOSIS — I10 Essential (primary) hypertension: Secondary | ICD-10-CM | POA: Diagnosis not present

## 2015-08-06 DIAGNOSIS — Z7984 Long term (current) use of oral hypoglycemic drugs: Secondary | ICD-10-CM | POA: Diagnosis not present

## 2015-08-06 DIAGNOSIS — E785 Hyperlipidemia, unspecified: Secondary | ICD-10-CM | POA: Insufficient documentation

## 2015-08-06 DIAGNOSIS — F329 Major depressive disorder, single episode, unspecified: Secondary | ICD-10-CM | POA: Diagnosis not present

## 2015-08-06 HISTORY — PX: EP IMPLANTABLE DEVICE: SHX172B

## 2015-08-06 LAB — COMPREHENSIVE METABOLIC PANEL
ALK PHOS: 151 U/L — AB (ref 38–126)
ALT: 18 U/L (ref 14–54)
AST: 27 U/L (ref 15–41)
Albumin: 3.1 g/dL — ABNORMAL LOW (ref 3.5–5.0)
Anion gap: 10 (ref 5–15)
BILIRUBIN TOTAL: 0.8 mg/dL (ref 0.3–1.2)
BUN: 15 mg/dL (ref 6–20)
CALCIUM: 9.3 mg/dL (ref 8.9–10.3)
CO2: 22 mmol/L (ref 22–32)
Chloride: 103 mmol/L (ref 101–111)
Creatinine, Ser: 1.08 mg/dL — ABNORMAL HIGH (ref 0.44–1.00)
GFR calc Af Amer: 59 mL/min — ABNORMAL LOW (ref >60)
GFR, EST NON AFRICAN AMERICAN: 51 mL/min — AB (ref >60)
Glucose, Bld: 191 mg/dL — ABNORMAL HIGH (ref 65–99)
POTASSIUM: 4.3 mmol/L (ref 3.5–5.1)
Sodium: 135 mmol/L (ref 135–145)
TOTAL PROTEIN: 7.5 g/dL (ref 6.5–8.1)

## 2015-08-06 LAB — CBC
HEMATOCRIT: 38.6 % (ref 36.0–46.0)
Hemoglobin: 12.9 g/dL (ref 12.0–15.0)
MCH: 27.3 pg (ref 26.0–34.0)
MCHC: 33.4 g/dL (ref 30.0–36.0)
MCV: 81.6 fL (ref 78.0–100.0)
Platelets: 184 10*3/uL (ref 150–400)
RBC: 4.73 MIL/uL (ref 3.87–5.11)
RDW: 15.2 % (ref 11.5–15.5)
WBC: 7.4 10*3/uL (ref 4.0–10.5)

## 2015-08-06 LAB — GLUCOSE, CAPILLARY
GLUCOSE-CAPILLARY: 187 mg/dL — AB (ref 65–99)
GLUCOSE-CAPILLARY: 193 mg/dL — AB (ref 65–99)

## 2015-08-06 LAB — SURGICAL PCR SCREEN
MRSA, PCR: NEGATIVE
STAPHYLOCOCCUS AUREUS: NEGATIVE

## 2015-08-06 SURGERY — ICD/BIV ICD GENERATOR CHANGEOUT

## 2015-08-06 SURGERY — PPM/BIV PPM GENERATOR CHANGEOUT
Anesthesia: LOCAL

## 2015-08-06 MED ORDER — ACETAMINOPHEN 325 MG PO TABS
325.0000 mg | ORAL_TABLET | ORAL | Status: DC | PRN
  Filled 2015-08-06: qty 2

## 2015-08-06 MED ORDER — ONDANSETRON HCL 4 MG/2ML IJ SOLN: 4.0000 mg | Freq: Four times a day (QID) | INTRAMUSCULAR | Status: DC | PRN

## 2015-08-06 MED ORDER — MIDAZOLAM HCL 5 MG/5ML IJ SOLN
INTRAMUSCULAR | Status: DC | PRN
  Administered 2015-08-06: 2 mg via INTRAVENOUS

## 2015-08-06 MED ORDER — VANCOMYCIN HCL IN DEXTROSE 1-5 GM/200ML-% IV SOLN
1000.0000 mg | INTRAVENOUS | Status: AC
  Administered 2015-08-06: 1000 mg via INTRAVENOUS
  Filled 2015-08-06: qty 200

## 2015-08-06 MED ORDER — CHLORHEXIDINE GLUCONATE 4 % EX LIQD
60.0000 mL | Freq: Once | CUTANEOUS | Status: AC
  Administered 2015-08-06: 4 via TOPICAL

## 2015-08-06 MED ORDER — FENTANYL CITRATE (PF) 100 MCG/2ML IJ SOLN
INTRAMUSCULAR | Status: AC
  Filled 2015-08-06: qty 2

## 2015-08-06 MED ORDER — GENTAMICIN SULFATE 40 MG/ML IJ SOLN
INTRAMUSCULAR | Status: AC
  Filled 2015-08-06: qty 2

## 2015-08-06 MED ORDER — SODIUM CHLORIDE 0.9 % IR SOLN
Status: AC
  Filled 2015-08-06: qty 2

## 2015-08-06 MED ORDER — SODIUM CHLORIDE 0.9 % IR SOLN
80.0000 mg | Status: AC
  Administered 2015-08-06: 80 mg

## 2015-08-06 MED ORDER — MIDAZOLAM HCL 5 MG/5ML IJ SOLN
INTRAMUSCULAR | Status: AC
  Filled 2015-08-06: qty 5

## 2015-08-06 MED ORDER — LIDOCAINE HCL (PF) 1 % IJ SOLN
INTRAMUSCULAR | Status: AC
  Filled 2015-08-06: qty 30

## 2015-08-06 MED ORDER — MUPIROCIN 2 % EX OINT
TOPICAL_OINTMENT | CUTANEOUS | Status: DC
Start: 2015-08-06 — End: 2015-08-06
  Filled 2015-08-06: qty 22

## 2015-08-06 MED ORDER — FENTANYL CITRATE (PF) 100 MCG/2ML IJ SOLN
INTRAMUSCULAR | Status: DC | PRN
  Administered 2015-08-06: 25 ug via INTRAVENOUS

## 2015-08-06 MED ORDER — SODIUM CHLORIDE 0.9 % IV SOLN: INTRAVENOUS | Status: DC

## 2015-08-06 MED ORDER — LIDOCAINE HCL (PF) 1 % IJ SOLN
INTRAMUSCULAR | Status: DC | PRN
  Administered 2015-08-06: 45 mL via SUBCUTANEOUS

## 2015-08-06 MED ORDER — MUPIROCIN 2 % EX OINT
1.0000 "application " | TOPICAL_OINTMENT | Freq: Once | CUTANEOUS | Status: AC
  Administered 2015-08-06: 1 via TOPICAL

## 2015-08-06 SURGICAL SUPPLY — 9 items
CABLE SURGICAL S-101-97-12 (CABLE) ×1 IMPLANT
PACEMAKER ADAPTA DR ADDRL1 (Pacemaker) IMPLANT
PAD DEFIB LIFELINK (PAD) ×1 IMPLANT
PPM ADAPTA DR ADDRL1 (Pacemaker) ×2 IMPLANT
SHEATH CLASSIC 7F (SHEATH) IMPLANT
SHEATH CLASSIC 8F (SHEATH) IMPLANT
SHEATH CLASSIC 9.5F (SHEATH) IMPLANT
SHEATH CLASSIC 9F (SHEATH) IMPLANT
TRAY PACEMAKER INSERTION (PACKS) ×1 IMPLANT

## 2015-08-06 NOTE — H&P (View-Only) (Signed)
HPI Tracey Koch returns today for followup. She is a pleasant 70 yo woman with a h/o symptomatic bradycardia, s/p PPM, HTN, arthritis, and DM. In the interim, she has been stable. No chest pain or sob. No syncope. Her knee pain has resolved but she still has some pain in the shoulders and arms. She notes fleeting twinges of chest pain which last a second or two and are not associated with exertion of physical activity. Allergies  Allergen Reactions  . Codeine Itching, Swelling and Rash  . Levaquin [Levofloxacin In D5w] Itching, Swelling and Rash  . Penicillins Itching, Swelling and Rash  . Sulfa Drugs Cross Reactors Itching, Swelling and Rash     Current Outpatient Prescriptions  Medication Sig Dispense Refill  . acetaminophen (TYLENOL) 500 MG tablet Take 1,000 mg by mouth every 6 (six) hours as needed for mild pain, moderate pain or fever.     Marland Kitchen HYDROcodone-acetaminophen (NORCO/VICODIN) 5-325 MG per tablet Take 1 tablet by mouth every 6 (six) hours as needed for moderate pain.    . metFORMIN (GLUCOPHAGE) 500 MG tablet Take 500 mg by mouth 2 (two) times daily with a meal.     . omega-3 acid ethyl esters (LOVAZA) 1 G capsule Take by mouth 2 (two) times daily.    . simvastatin (ZOCOR) 20 MG tablet Take 20 mg by mouth every evening.    . solifenacin (VESICARE) 5 MG tablet Take 5 mg by mouth daily.    . traZODone (DESYREL) 150 MG tablet Take 150 mg by mouth at bedtime.     No current facility-administered medications for this visit.     Past Medical History  Diagnosis Date  . Hyperlipidemia     takes Simvastatin nightly  . Seasonal allergies     takes Allegra daily  . Depression     takes Prozac at bedtime  . Diabetes mellitus without complication (HCC)     takes Metformin daily  . Sinoatrial node dysfunction (HCC)     has ICD  . Anemia     takes Folic Acid daily  . Arthritis     takes Plaquenil daily and Methotrexate 2 times a week  . Family history of anesthesia complication      sister post op N/V  . Pacemaker   . Cough     takes Allegra daily  . Joint pain   . Joint swelling   . History of UTI   . History of blood transfusion     ROS:   All systems reviewed and negative except as noted in the HPI.   Past Surgical History  Procedure Laterality Date  . Abdominal hysterectomy    . Insert / replace / remove pacemaker    . Cholecystectomy    . Colonoscopy    . Right knee replacement    . Total knee arthroplasty Left 05/06/2013    Dr Cleophas Dunker  . Total knee arthroplasty Left 05/06/2013    Procedure: TOTAL KNEE ARTHROPLASTY;  Surgeon: [name], MD;  Location: [facility] OR;  Service: Orthopedics;  Laterality: Left;     No family history on file.   Social History   Social History  . Marital Status: Married    Spouse Name: N/A  . Number of Children: N/A  . Years of Education: N/A   Occupational History  . Not on file.   Social History Main Topics  . Smoking status: Former Smoker -- 1.00 packs/day for 52 years    Types: Cigarettes  Start date: 03/01/1962    Quit date: 02/27/2014  . Smokeless tobacco: Never Used  . Alcohol Use: No  . Drug Use: No  . Sexual Activity: Not Currently    Birth Control/ Protection: Surgical   Other Topics Concern  . Not on file   Social History Narrative     BP 128/74 mmHg  Pulse 95  Ht 5\' 5"  (1.651 m)  Wt 188 lb 3.2 oz (85.367 kg)  BMI 31.32 kg/m2  SpO2 97%  Physical Exam:  Well appearing 70 yo woman, NAD HEENT: Unremarkable Neck:  7 cm JVD, no thyromegally Lungs:  Clear with no wheezes, rales, or rhonchi HEART:  Regular rate rhythm, no murmurs, no rubs, no clicks Abd:  soft, positive bowel sounds, no organomegally, no rebound, no guarding Ext:  2 plus pulses, no edema, no cyanosis, no clubbing Skin:  No rashes no nodules Neuro:  CN II through XII intact, motor grossly intact  DEVICE  Normal device function.  See PaceArt for details. She is at [facility].  Assess/Plan:

## 2015-08-06 NOTE — Discharge Instructions (Signed)
Pacemaker Battery Change °A pacemaker battery usually lasts 4 to 12 years. Once or twice per year, you will be asked to visit your health care provider to have a full evaluation of your pacemaker. When a battery needs to be replaced, the entire pacemaker is replaced so that you can benefit from new circuitry and any new features that have been added to pacemakers. Most often, this procedure is very simple because the leads are already in place.  °There are many things that affect how long a pacemaker battery will last, including:  °· The age of the pacemaker.   °· The number of leads (1, 2, or 3).   °· The pacemaker work load. If the pacemaker is helping the heart more often, the battery will not last as long as it would if the pacemaker did not need to help the heart.   °· Power (voltage) settings.  °LET YOUR HEALTH CARE PROVIDER KNOW ABOUT:  °· Any allergies you have.   °· All medicines you are taking, including vitamins, herbs, eye drops, creams, and over-the-counter medicines.   °· Previous problems you or members of your family have had with the use of anesthetics.   °· Any blood disorders you have.   °· Previous surgeries you have had, especially since your last pacemaker placement.   °· Medical conditions you have.   °· Possibility of pregnancy, if this applies. °· Symptoms of chest pain, trouble breathing, palpitations, light-headedness, or feelings of an abnormal or irregular heartbeat. °RISKS AND COMPLICATIONS  °Generally, this is a safe procedure. However, as with any procedure, problems can occur and include:  °· Bleeding.   °· Bruising of the skin around where the incision was made.   °· Pain at the incision site.   °· Pulling apart of the skin at the incision site.   °· Infection.   °· Allergic reaction to anesthetics or other medicines used during the procedure.   °People with diabetes may have a temporary increase in their blood sugar after any surgical procedure.  °BEFORE THE PROCEDURE  °· Wash all  of the skin around the area of the chest where the pacemaker is located.   °· Ask your health care provider for help with any medicine adjustments before the pacemaker is replaced.   °· Do not eat or drink anything after midnight on the night before the procedure or as directed by your health care provider. °· Ask your health care provider if you can take a sip of water with any approved medicines the morning of the procedure. °PROCEDURE  °· After giving medicine to numb the skin (local anesthetic), your health care provider will make a cut to reopen the pocket holding the pacemaker.   °· The old pacemaker will be disconnected from its leads.   °· The leads will be tested.   °· If needed, the leads will be replaced. If the leads are functioning properly, the new pacemaker may be connected to the existing leads. °· A heart monitor and the pacemaker programmer will be used to make sure that the new pacemaker is working properly. °· The incision site will then be closed. A dressing will be placed over the pacemaker site. The dressing will be removed 24-48 hours afterward. °AFTER THE PROCEDURE  °· You will be taken to a recovery area after the new pacemaker implant is completed. Your vital signs such as blood pressure, heart rate, breathing, and oxygen levels will be monitored. °· Your health care provider will tell you when you will need to next test your pacemaker or when to return to the office for follow-up   for removal of stitches. °  °This information is not intended to replace advice given to you by your health care provider. Make sure you discuss any questions you have with your health care provider. °  °Document Released: 10/25/2006 Document Revised: 08/07/2014 Document Reviewed: 01/29/2013 °Elsevier Interactive Patient Education ©2016 [employer] ° °

## 2015-08-06 NOTE — Interval H&P Note (Signed)
History and Physical Interval Note:  08/06/2015 7:50 AM  Tracey Koch  has presented today for surgery, with the diagnosis of eri  The various methods of treatment have been discussed with the patient and family. After consideration of risks, benefits and other options for treatment, the patient has consented to  Permanent PM generator change out as a surgical intervention .  The patient's history has been reviewed, patient examined, no change in status, stable for surgery.  I have reviewed the patient's chart and labs.  Questions were answered to the patient's satisfaction.     Lewayne Bunting

## 2015-08-09 ENCOUNTER — Encounter (HOSPITAL_COMMUNITY): Payer: Self-pay | Admitting: Internal Medicine

## 2015-08-25 ENCOUNTER — Telehealth: Payer: Self-pay | Admitting: Cardiology

## 2015-08-25 NOTE — Telephone Encounter (Signed)
Spoke w/ pt and informed her that the return kit is for the old monitor. Pt verbalized understanding . 

## 2015-08-25 NOTE — Telephone Encounter (Signed)
Tracey Koch is asking to speak to someone in the [facility] about a box she received requesting that she send  It back.

## 2015-09-25 IMAGING — CT CT HEAD W/O CM
1 of 2 series · 13 of 30 positions shown, 17 images · non-contrast
Comparison: 10/30/2011.

CLINICAL DATA: Fever, headache and confusion.

EXAM:
CT HEAD WITHOUT CONTRAST
TECHNIQUE: Contiguous axial images were obtained from the base of the skull
through the vertex without intravenous contrast.

[Series 2: headseq 4.8 h37s · axial · 0.50mm/px · z∈[+135,+259]mm · 13 of 30 slices shown, 17 images]
[im 3/30  brain]
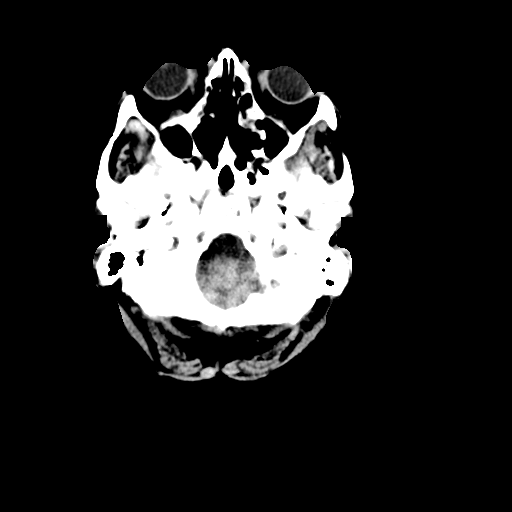
[im 3/30  bone]
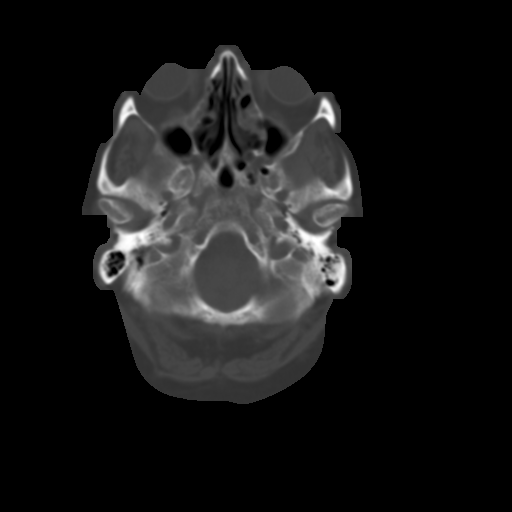
[im 5/30  brain]
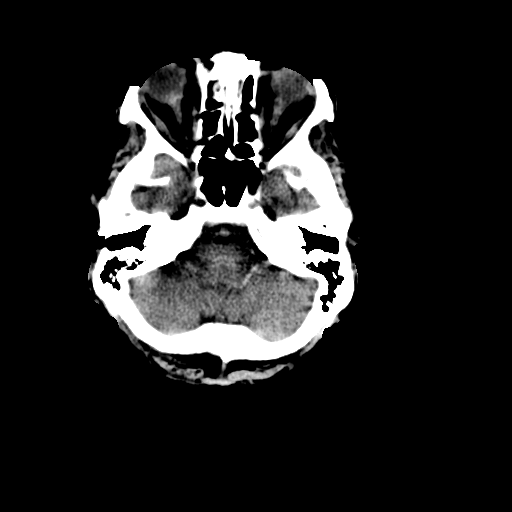
[im 7/30  brain]
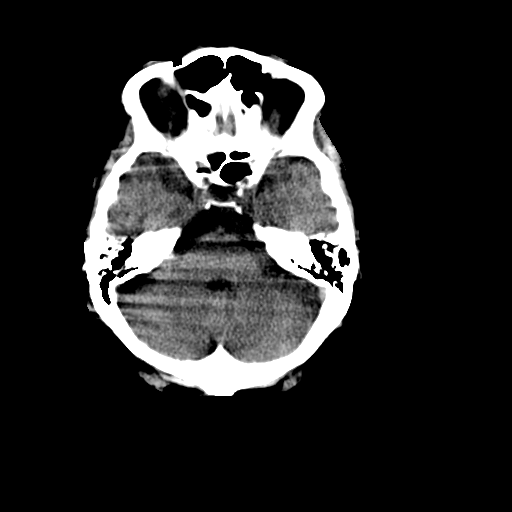
[im 9/30  brain]
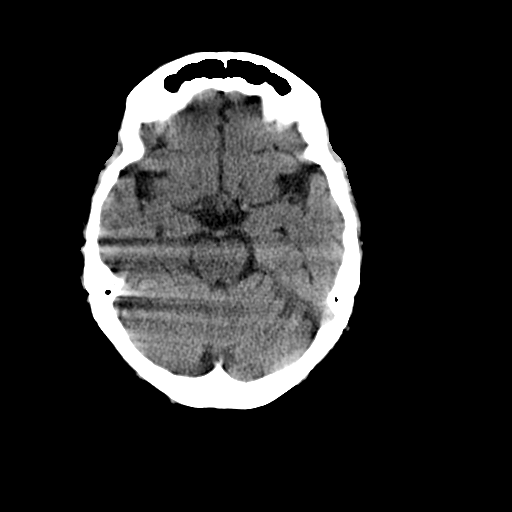
[im 11/30  brain]
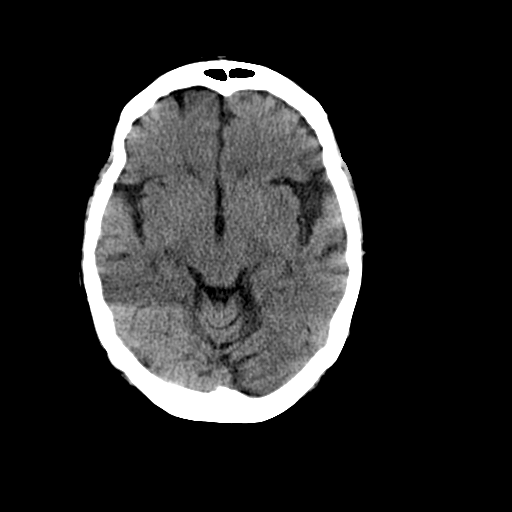
[im 11/30  bone]
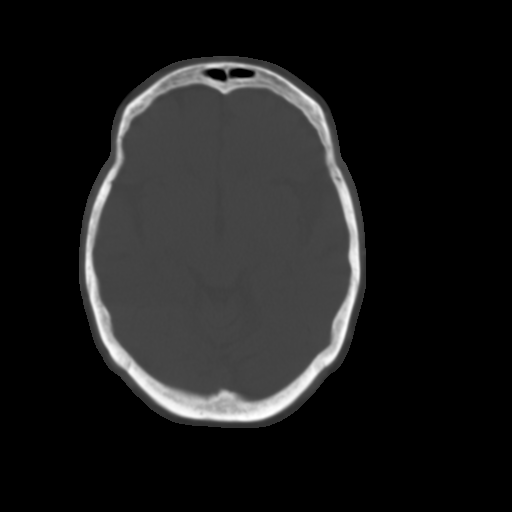
[im 13/30  brain]
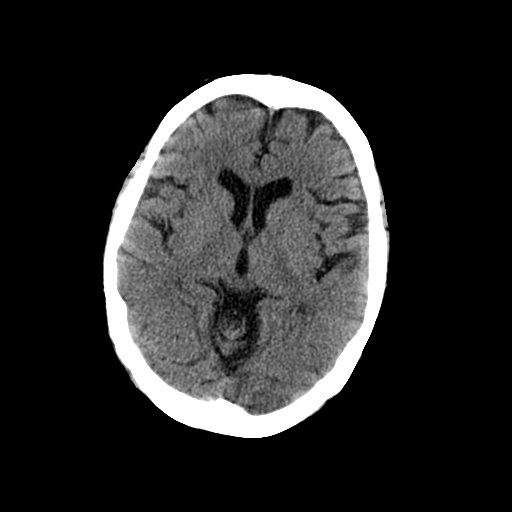
[im 15/30  brain]
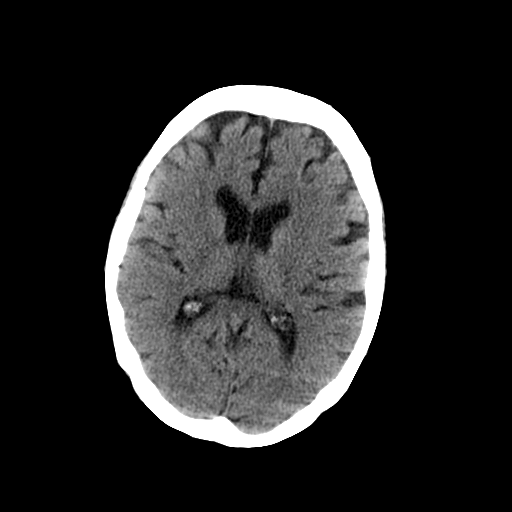
[im 17/30  brain]
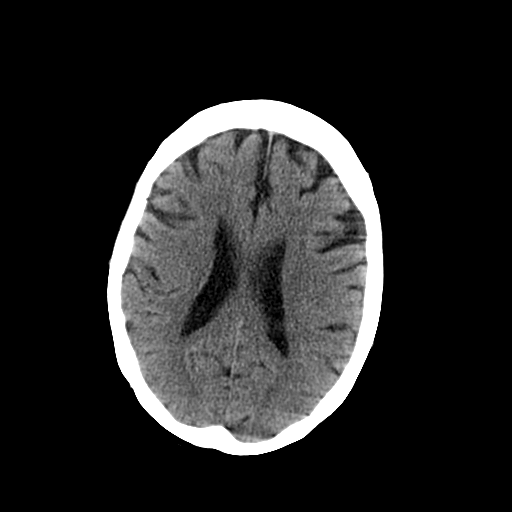
[im 19/30  brain]
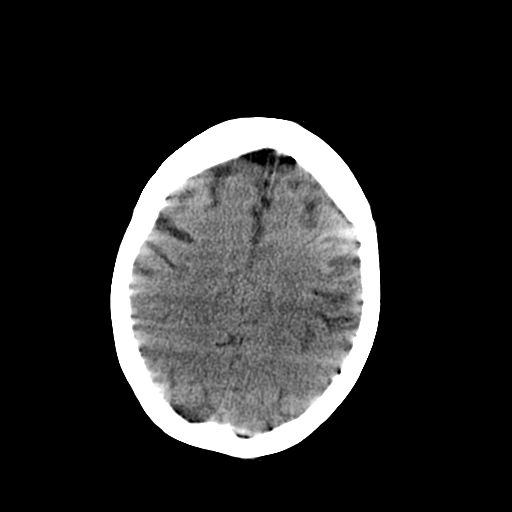
[im 19/30  bone]
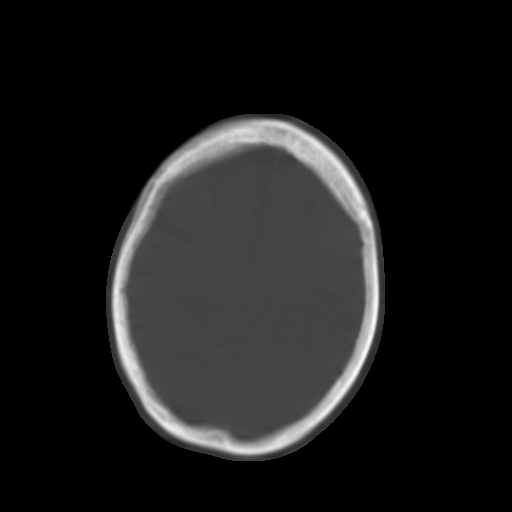
[im 21/30  brain]
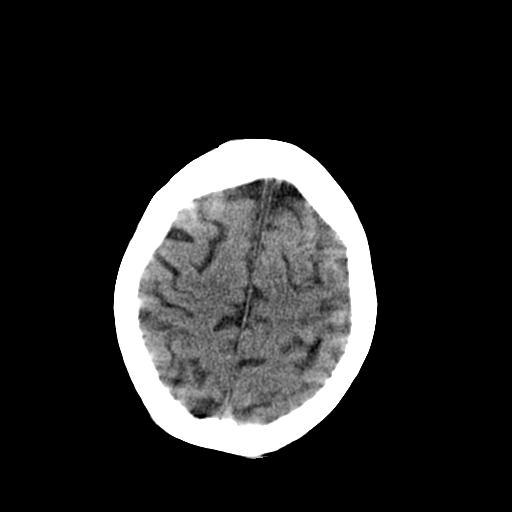
[im 23/30  brain]
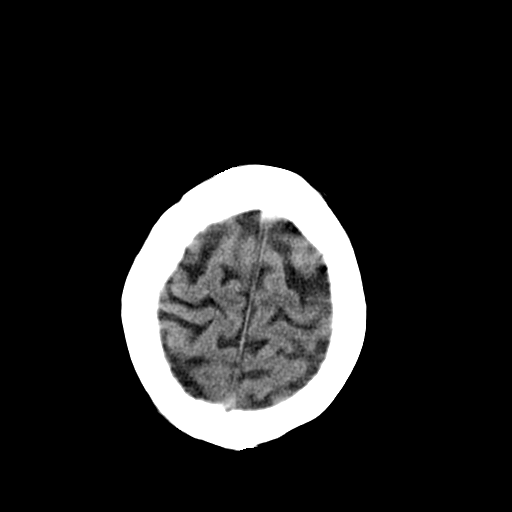
[im 25/30  brain]
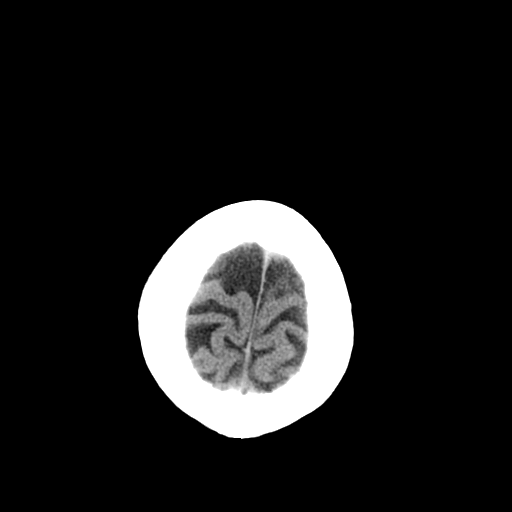
[im 27/30  brain]
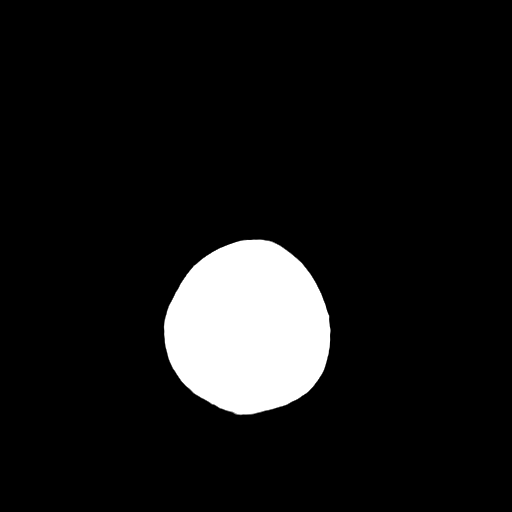
[im 27/30  bone]
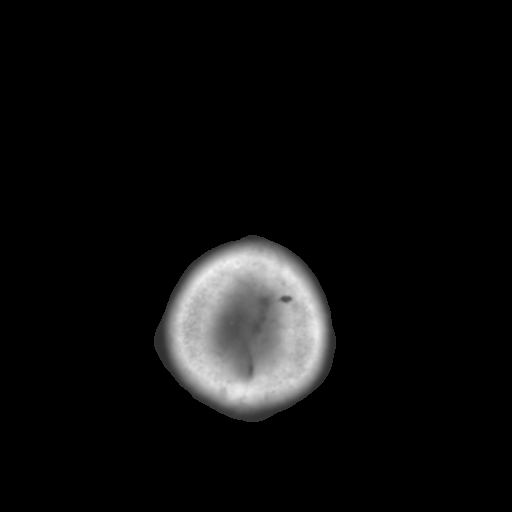

[13 of 30 positions shown; findings below may reference images not displayed]

FINDINGS: No evidence of an acute infarct, acute hemorrhage, mass lesion, mass
effect or hydrocephalus. Mild atrophy. Mucosal thickening is seen in
the ethmoid air cells. No air-fluid levels. Mastoid air cells are
clear.
IMPRESSION: 1. No acute intracranial abnormality.
2. Mild atrophy.
3. Mucosal thickening in the ethmoid air cells.

## 2015-09-29 ENCOUNTER — Encounter: Payer: Self-pay | Admitting: *Deleted

## 2015-10-11 ENCOUNTER — Telehealth: Payer: Self-pay | Admitting: Cardiology

## 2015-10-11 ENCOUNTER — Ambulatory Visit: Payer: Medicare Other | Admitting: *Deleted

## 2015-10-11 NOTE — Telephone Encounter (Signed)
Spoke with pt and reminded pt of remote transmission that is due today. Pt verbalized understanding.   

## 2015-10-15 ENCOUNTER — Encounter: Payer: Self-pay | Admitting: Cardiology

## 2015-10-20 ENCOUNTER — Ambulatory Visit (INDEPENDENT_AMBULATORY_CARE_PROVIDER_SITE_OTHER): Payer: Medicare Other | Admitting: *Deleted

## 2015-10-20 DIAGNOSIS — I495 Sick sinus syndrome: Secondary | ICD-10-CM | POA: Diagnosis not present

## 2015-10-20 DIAGNOSIS — Z950 Presence of cardiac pacemaker: Secondary | ICD-10-CM | POA: Diagnosis not present

## 2015-10-20 LAB — CUP PACEART REMOTE DEVICE CHECK
Battery Impedance: 100 Ohm
Battery Remaining Longevity: 176 mo
Battery Voltage: 2.81 V
Brady Statistic AP VS Percent: 1 %
Brady Statistic AS VP Percent: 0 %
Implantable Lead Implant Date: 20061110
Implantable Lead Location: 753860
Implantable Lead Model: 5076
Implantable Lead Model: 5076
Lead Channel Impedance Value: 514 Ohm
Lead Channel Impedance Value: 822 Ohm
Lead Channel Pacing Threshold Pulse Width: 0.4 ms
Lead Channel Sensing Intrinsic Amplitude: 1.4 mV
Lead Channel Setting Pacing Amplitude: 2 V
MDC IDC LEAD IMPLANT DT: 20061110
MDC IDC LEAD LOCATION: 753859
MDC IDC MSMT LEADCHNL RA PACING THRESHOLD AMPLITUDE: 0.5 V
MDC IDC MSMT LEADCHNL RV PACING THRESHOLD AMPLITUDE: 0.625 V
MDC IDC MSMT LEADCHNL RV PACING THRESHOLD PULSEWIDTH: 0.4 ms
MDC IDC MSMT LEADCHNL RV SENSING INTR AMPL: 16 mV
MDC IDC SESS DTM: 20170322175611
MDC IDC SET LEADCHNL RA PACING AMPLITUDE: 1.5 V
MDC IDC SET LEADCHNL RV PACING PULSEWIDTH: 0.4 ms
MDC IDC SET LEADCHNL RV SENSING SENSITIVITY: 5.6 mV
MDC IDC STAT BRADY AP VP PERCENT: 0 %
MDC IDC STAT BRADY AS VS PERCENT: 99 %

## 2015-10-22 NOTE — Progress Notes (Signed)
Remote pacemaker transmission.   

## 2015-11-19 ENCOUNTER — Encounter: Payer: Self-pay | Admitting: Cardiology

## 2016-02-07 ENCOUNTER — Ambulatory Visit (INDEPENDENT_AMBULATORY_CARE_PROVIDER_SITE_OTHER): Payer: Medicare Other | Admitting: Internal Medicine

## 2016-02-07 ENCOUNTER — Encounter: Payer: Self-pay | Admitting: Internal Medicine

## 2016-02-07 VITALS — BP 144/80 | HR 88 | Ht 65.0 in | Wt 186.0 lb

## 2016-02-07 DIAGNOSIS — I495 Sick sinus syndrome: Secondary | ICD-10-CM | POA: Diagnosis not present

## 2016-02-07 LAB — CUP PACEART INCLINIC DEVICE CHECK
Battery Impedance: 100 Ohm
Battery Remaining Longevity: 176 mo
Battery Voltage: 2.8 V
Brady Statistic AP VS Percent: 1 %
Date Time Interrogation Session: 20170710114856
Implantable Lead Location: 753859
Implantable Lead Model: 5076
Lead Channel Impedance Value: 585 Ohm
Lead Channel Pacing Threshold Amplitude: 0.5 V
Lead Channel Pacing Threshold Amplitude: 0.75 V
Lead Channel Pacing Threshold Pulse Width: 0.4 ms
Lead Channel Pacing Threshold Pulse Width: 0.4 ms
Lead Channel Pacing Threshold Pulse Width: 0.4 ms
Lead Channel Setting Pacing Amplitude: 2 V
Lead Channel Setting Sensing Sensitivity: 5.6 mV
MDC IDC LEAD IMPLANT DT: 20061110
MDC IDC LEAD IMPLANT DT: 20061110
MDC IDC LEAD LOCATION: 753860
MDC IDC MSMT LEADCHNL RA PACING THRESHOLD AMPLITUDE: 0.5 V
MDC IDC MSMT LEADCHNL RA SENSING INTR AMPL: 2.8 mV
MDC IDC MSMT LEADCHNL RV IMPEDANCE VALUE: 862 Ohm
MDC IDC MSMT LEADCHNL RV PACING THRESHOLD AMPLITUDE: 0.75 V
MDC IDC MSMT LEADCHNL RV PACING THRESHOLD PULSEWIDTH: 0.4 ms
MDC IDC MSMT LEADCHNL RV SENSING INTR AMPL: 22.4 mV
MDC IDC SET LEADCHNL RA PACING AMPLITUDE: 1.5 V
MDC IDC SET LEADCHNL RV PACING PULSEWIDTH: 0.4 ms
MDC IDC STAT BRADY AP VP PERCENT: 0 %
MDC IDC STAT BRADY AS VP PERCENT: 0 %
MDC IDC STAT BRADY AS VS PERCENT: 99 %

## 2016-02-07 NOTE — Progress Notes (Signed)
HPI Mrs. Frank returns today for followup. She is a pleasant 70 yo woman with a h/o symptomatic bradycardia, s/p PPM, HTN, arthritis, and DM. In the interim, she has been stable. No chest pain or sob. No syncope. She underwent gen change 6 months ago and has done well in the interim. She admits to dietary indiscretion, especially with her DM. She likes to drink sweet tea. Allergies  Allergen Reactions  . Penicillins Shortness Of Breath, Itching, Swelling and Rash    Has patient had a PCN reaction causing immediate rash, facial/tongue/throat swelling, SOB or lightheadedness with hypotension: Yes Has patient had a PCN reaction causing severe rash involving mucus membranes or skin necrosis: No Has patient had a PCN reaction that required hospitalization No Has patient had a PCN reaction occurring within the last 10 years: No If all of the above answers are "NO", then may proceed with Cephalosporin use.   . Codeine Itching, Swelling and Rash  . Levaquin [Levofloxacin In D5w] Itching, Swelling and Other (See Comments)    Can take low dose  . Sulfa Drugs Cross Reactors Itching, Swelling and Rash     Current Outpatient Prescriptions  Medication Sig Dispense Refill  . glimepiride (AMARYL) 1 MG tablet Take 1 mg by mouth daily.    Marland Kitchen HYDROcodone-acetaminophen (NORCO/VICODIN) 5-325 MG per tablet Take 1 tablet by mouth every 6 (six) hours as needed for moderate pain.    . metFORMIN (GLUCOPHAGE) 500 MG tablet Take 500 mg by mouth 2 (two) times daily with a meal.     . omega-3 acid ethyl esters (LOVAZA) 1 G capsule Take by mouth 2 (two) times daily.    . simvastatin (ZOCOR) 20 MG tablet Take 20 mg by mouth every evening.    . solifenacin (VESICARE) 5 MG tablet Take 5 mg by mouth daily.    . traZODone (DESYREL) 150 MG tablet Take 150 mg by mouth at bedtime.     No current facility-administered medications for this visit.     Past Medical History  Diagnosis Date  . Hyperlipidemia     takes  Simvastatin nightly  . Seasonal allergies     takes Allegra daily  . Depression     takes Prozac at bedtime  . Diabetes mellitus without complication (HCC)     takes Metformin daily  . Sinoatrial node dysfunction (HCC)     has ICD  . Anemia     takes Folic Acid daily  . Arthritis     takes Plaquenil daily and Methotrexate 2 times a week  . Family history of anesthesia complication     sister post op N/V  . Pacemaker   . Cough     takes Allegra daily  . Joint pain   . Joint swelling   . History of UTI   . History of blood transfusion     ROS:   All systems reviewed and negative except as noted in the HPI.   Past Surgical History  Procedure Laterality Date  . Abdominal hysterectomy    . Insert / replace / remove pacemaker    . Cholecystectomy    . Colonoscopy    . Right knee replacement    . Total knee arthroplasty Left 05/06/2013    Dr Cleophas Dunker  . Total knee arthroplasty Left 05/06/2013    Procedure: TOTAL KNEE ARTHROPLASTY;  Surgeon: [name], MD;  Location: [facility] OR;  Service: Orthopedics;  Laterality: Left;  . Ep implantable device N/A 08/06/2015    Procedure:  PPM Generator Changeout;  Surgeon: Marinus Maw, MD;  Location: [facility] INVASIVE CV LAB;  Service: Cardiovascular;  Laterality: N/A;     No family history on file.   Social History   Social History  . Marital Status: Married    Spouse Name: N/A  . Number of Children: N/A  . Years of Education: N/A   Occupational History  . Not on file.   Social History Main Topics  . Smoking status: Current Every Day Smoker -- 1.00 packs/day for 52 years    Types: Cigarettes    Start date: 03/01/1962    Last Attempt to Quit: 02/27/2014  . Smokeless tobacco: Never Used  . Alcohol Use: No  . Drug Use: No  . Sexual Activity: Not Currently    Birth Control/ Protection: Surgical   Other Topics Concern  . Not on file   Social History Narrative     BP 144/80 mmHg  Pulse 88  Ht 5\' 5"  (1.651 m)  Wt 186  lb (84.369 kg)  BMI 30.95 kg/m2  SpO2 94%  Physical Exam:  Well appearing 70 yo woman, NAD HEENT: Unremarkable Neck:  7 cm JVD, no thyromegally Lungs:  Clear with no wheezes, rales, or rhonchi HEART:  Regular rate rhythm, no murmurs, no rubs, no clicks Abd:  soft, positive bowel sounds, no organomegally, no rebound, no guarding Ext:  2 plus pulses, no edema, no cyanosis, no clubbing Skin:  No rashes no nodules Neuro:  CN II through XII intact, motor grossly intact  DEVICE  Normal device function.  See PaceArt for details.   Assess/Plan:  1. Sinus node dysfunction - she is s/p PM generator change out 6 months ago and has healed nicely. She is asymptomatic. 2. Dyslipidemia - she will continue her current meds. She is encouraged to maintain a low fat diet. 3. PPM - her medtronic DDD PM is working normally. Will recheck in several months.  65, M.D.

## 2016-02-07 NOTE — Patient Instructions (Signed)
Your physician wants you to follow-up in: 1 Year with Dr. Taylor. You will receive a reminder letter in the mail two months in advance. If you don't receive a letter, please call our office to schedule the follow-up appointment.  Remote monitoring is used to monitor your Pacemaker of ICD from home. This monitoring reduces the number of office visits required to check your device to one time per year. It allows us to keep an eye on the functioning of your device to ensure it is working properly. You are scheduled for a device check from home on 05/08/16. You may send your transmission at any time that day. If you have a wireless device, the transmission will be sent automatically. After your physician reviews your transmission, you will receive a postcard with your next transmission date.  If you need a refill on your cardiac medications before your next appointment, please call your pharmacy.  Thank you for choosing [facility]!   

## 2016-05-08 ENCOUNTER — Telehealth: Payer: Self-pay | Admitting: Cardiology

## 2016-05-08 ENCOUNTER — Encounter: Payer: Medicare Other | Admitting: *Deleted

## 2016-05-08 NOTE — Telephone Encounter (Signed)
LMOVM reminding pt to send remote transmission.   

## 2016-05-12 ENCOUNTER — Encounter: Payer: Self-pay | Admitting: Cardiology

## 2016-06-19 ENCOUNTER — Encounter (HOSPITAL_COMMUNITY): Payer: Self-pay | Admitting: Emergency Medicine

## 2016-06-19 ENCOUNTER — Emergency Department (HOSPITAL_COMMUNITY)
Admission: EM | Admit: 2016-06-19 | Discharge: 2016-06-19 | Disposition: A | Discharge status: Home / Self Care | Payer: Medicare Other | Source: Home / Self Care | Attending: Emergency Medicine | Admitting: Emergency Medicine

## 2016-06-19 ENCOUNTER — Inpatient Hospital Stay (HOSPITAL_COMMUNITY)
Admission: EM | Admit: 2016-06-19 | Discharge: 2016-06-21 | DRG: 607 | Disposition: A | Discharge status: Home / Self Care | Payer: Medicare Other | Attending: Internal Medicine | Admitting: Internal Medicine

## 2016-06-19 DIAGNOSIS — T7840XA Allergy, unspecified, initial encounter: Secondary | ICD-10-CM

## 2016-06-19 DIAGNOSIS — E871 Hypo-osmolality and hyponatremia: Secondary | ICD-10-CM | POA: Diagnosis present

## 2016-06-19 DIAGNOSIS — D649 Anemia, unspecified: Secondary | ICD-10-CM | POA: Diagnosis present

## 2016-06-19 DIAGNOSIS — Z23 Encounter for immunization: Secondary | ICD-10-CM

## 2016-06-19 DIAGNOSIS — Z950 Presence of cardiac pacemaker: Secondary | ICD-10-CM

## 2016-06-19 DIAGNOSIS — E785 Hyperlipidemia, unspecified: Secondary | ICD-10-CM | POA: Diagnosis present

## 2016-06-19 DIAGNOSIS — Z885 Allergy status to narcotic agent status: Secondary | ICD-10-CM

## 2016-06-19 DIAGNOSIS — N3000 Acute cystitis without hematuria: Secondary | ICD-10-CM

## 2016-06-19 DIAGNOSIS — E119 Type 2 diabetes mellitus without complications: Secondary | ICD-10-CM

## 2016-06-19 DIAGNOSIS — N3001 Acute cystitis with hematuria: Secondary | ICD-10-CM | POA: Diagnosis present

## 2016-06-19 DIAGNOSIS — R112 Nausea with vomiting, unspecified: Secondary | ICD-10-CM | POA: Diagnosis present

## 2016-06-19 DIAGNOSIS — Z880 Allergy status to penicillin: Secondary | ICD-10-CM

## 2016-06-19 DIAGNOSIS — I10 Essential (primary) hypertension: Secondary | ICD-10-CM | POA: Diagnosis present

## 2016-06-19 DIAGNOSIS — Z881 Allergy status to other antibiotic agents status: Secondary | ICD-10-CM

## 2016-06-19 DIAGNOSIS — F17210 Nicotine dependence, cigarettes, uncomplicated: Secondary | ICD-10-CM

## 2016-06-19 DIAGNOSIS — Z882 Allergy status to sulfonamides status: Secondary | ICD-10-CM

## 2016-06-19 DIAGNOSIS — L270 Generalized skin eruption due to drugs and medicaments taken internally: Principal | ICD-10-CM | POA: Diagnosis present

## 2016-06-19 DIAGNOSIS — N390 Urinary tract infection, site not specified: Secondary | ICD-10-CM | POA: Diagnosis present

## 2016-06-19 DIAGNOSIS — Z7984 Long term (current) use of oral hypoglycemic drugs: Secondary | ICD-10-CM

## 2016-06-19 DIAGNOSIS — Y92009 Unspecified place in unspecified non-institutional (private) residence as the place of occurrence of the external cause: Secondary | ICD-10-CM

## 2016-06-19 DIAGNOSIS — R062 Wheezing: Secondary | ICD-10-CM | POA: Diagnosis present

## 2016-06-19 DIAGNOSIS — Z79899 Other long term (current) drug therapy: Secondary | ICD-10-CM

## 2016-06-19 DIAGNOSIS — Z87440 Personal history of urinary (tract) infections: Secondary | ICD-10-CM

## 2016-06-19 DIAGNOSIS — T378X5A Adverse effect of other specified systemic anti-infectives and antiparasitics, initial encounter: Secondary | ICD-10-CM | POA: Diagnosis present

## 2016-06-19 DIAGNOSIS — K521 Toxic gastroenteritis and colitis: Secondary | ICD-10-CM | POA: Diagnosis present

## 2016-06-19 LAB — URINE MICROSCOPIC-ADD ON: Squamous Epithelial / LPF: NONE SEEN

## 2016-06-19 LAB — URINALYSIS, ROUTINE W REFLEX MICROSCOPIC
Bilirubin Urine: NEGATIVE
Glucose, UA: NEGATIVE mg/dL
Ketones, ur: NEGATIVE mg/dL
Nitrite: NEGATIVE
Specific Gravity, Urine: 1.01 (ref 1.005–1.030)
pH: 6 (ref 5.0–8.0)

## 2016-06-19 LAB — CBC
HCT: 37.8 % (ref 36.0–46.0)
Hemoglobin: 12.7 g/dL (ref 12.0–15.0)
MCH: 27.3 pg (ref 26.0–34.0)
MCHC: 33.6 g/dL (ref 30.0–36.0)
MCV: 81.1 fL (ref 78.0–100.0)
Platelets: 177 10*3/uL (ref 150–400)
RBC: 4.66 MIL/uL (ref 3.87–5.11)
RDW: 14.7 % (ref 11.5–15.5)
WBC: 6.4 10*3/uL (ref 4.0–10.5)

## 2016-06-19 LAB — GLUCOSE, CAPILLARY: GLUCOSE-CAPILLARY: 313 mg/dL — AB (ref 65–99)

## 2016-06-19 LAB — BASIC METABOLIC PANEL
Anion gap: 7 (ref 5–15)
BUN: 15 mg/dL (ref 6–20)
CO2: 24 mmol/L (ref 22–32)
Calcium: 9 mg/dL (ref 8.9–10.3)
Chloride: 101 mmol/L (ref 101–111)
Creatinine, Ser: 0.9 mg/dL (ref 0.44–1.00)
GFR calc Af Amer: >60 mL/min (ref >60)
GFR calc non Af Amer: >60 mL/min (ref >60)
Glucose, Bld: 144 mg/dL — ABNORMAL HIGH (ref 65–99)
Potassium: 4.1 mmol/L (ref 3.5–5.1)
Sodium: 132 mmol/L — ABNORMAL LOW (ref 135–145)

## 2016-06-19 MED ORDER — ONDANSETRON HCL 4 MG PO TABS: 4.0000 mg | ORAL_TABLET | Freq: Four times a day (QID) | ORAL | Status: DC | PRN

## 2016-06-19 MED ORDER — SIMVASTATIN 20 MG PO TABS
20.0000 mg | ORAL_TABLET | Freq: Every evening | ORAL | Status: DC
  Administered 2016-06-20: 20 mg via ORAL
  Filled 2016-06-19: qty 1

## 2016-06-19 MED ORDER — INSULIN ASPART 100 UNIT/ML ~~LOC~~ SOLN
0.0000 [IU] | Freq: Three times a day (TID) | SUBCUTANEOUS | Status: DC
  Administered 2016-06-20: 5 [IU] via SUBCUTANEOUS

## 2016-06-19 MED ORDER — INFLUENZA VAC SPLIT QUAD 0.5 ML IM SUSY
0.5000 mL | PREFILLED_SYRINGE | INTRAMUSCULAR | Status: AC
  Administered 2016-06-21: 0.5 mL via INTRAMUSCULAR

## 2016-06-19 MED ORDER — METHYLPREDNISOLONE SODIUM SUCC 40 MG IJ SOLR
40.0000 mg | Freq: Two times a day (BID) | INTRAMUSCULAR | Status: DC
  Administered 2016-06-20: 40 mg via INTRAVENOUS
  Filled 2016-06-19: qty 1

## 2016-06-19 MED ORDER — DEXTROSE 5 % IV SOLN
2.0000 g | Freq: Once | INTRAVENOUS | Status: AC
  Administered 2016-06-19: 2 g via INTRAVENOUS
  Filled 2016-06-19: qty 2

## 2016-06-19 MED ORDER — SODIUM CHLORIDE 0.9 % IV SOLN
INTRAVENOUS | Status: DC
  Administered 2016-06-19 – 2016-06-20 (×2): via INTRAVENOUS

## 2016-06-19 MED ORDER — HYDROCODONE-ACETAMINOPHEN 7.5-325 MG PO TABS
1.0000 | ORAL_TABLET | Freq: Four times a day (QID) | ORAL | Status: DC | PRN
  Administered 2016-06-20 (×4): 1 via ORAL
  Filled 2016-06-19 (×5): qty 1

## 2016-06-19 MED ORDER — ALBUTEROL SULFATE (2.5 MG/3ML) 0.083% IN NEBU
2.5000 mg | INHALATION_SOLUTION | Freq: Four times a day (QID) | RESPIRATORY_TRACT | Status: DC
  Administered 2016-06-19 – 2016-06-20 (×3): 2.5 mg via RESPIRATORY_TRACT
  Filled 2016-06-19 (×3): qty 3

## 2016-06-19 MED ORDER — INSULIN ASPART 100 UNIT/ML ~~LOC~~ SOLN
5.0000 [IU] | Freq: Once | SUBCUTANEOUS | Status: AC
  Administered 2016-06-19: 5 [IU] via SUBCUTANEOUS

## 2016-06-19 MED ORDER — FAMOTIDINE 20 MG PO TABS
20.0000 mg | ORAL_TABLET | Freq: Two times a day (BID) | ORAL | Status: DC
  Administered 2016-06-20 – 2016-06-21 (×3): 20 mg via ORAL
  Filled 2016-06-19 (×3): qty 1

## 2016-06-19 MED ORDER — TRAZODONE HCL 50 MG PO TABS
150.0000 mg | ORAL_TABLET | Freq: Every day | ORAL | Status: DC
  Administered 2016-06-20: 150 mg via ORAL
  Filled 2016-06-19: qty 3

## 2016-06-19 MED ORDER — NITROFURANTOIN MONOHYD MACRO 100 MG PO CAPS: 100.0000 mg | ORAL_CAPSULE | Freq: Two times a day (BID) | ORAL | 0 refills | Status: DC

## 2016-06-19 MED ORDER — ENOXAPARIN SODIUM 40 MG/0.4ML ~~LOC~~ SOLN
40.0000 mg | SUBCUTANEOUS | Status: DC
  Filled 2016-06-19: qty 0.4

## 2016-06-19 MED ORDER — DEXTROSE 5 % IV SOLN
1.0000 g | Freq: Three times a day (TID) | INTRAVENOUS | Status: DC
  Administered 2016-06-20 – 2016-06-21 (×4): 1 g via INTRAVENOUS
  Filled 2016-06-19 (×11): qty 1

## 2016-06-19 MED ORDER — DIPHENHYDRAMINE HCL 50 MG/ML IJ SOLN
25.0000 mg | Freq: Once | INTRAMUSCULAR | Status: AC
  Administered 2016-06-19: 25 mg via INTRAVENOUS
  Filled 2016-06-19: qty 1

## 2016-06-19 MED ORDER — METHYLPREDNISOLONE SODIUM SUCC 125 MG IJ SOLR
125.0000 mg | Freq: Once | INTRAMUSCULAR | Status: AC
  Administered 2016-06-19: 125 mg via INTRAVENOUS
  Filled 2016-06-19: qty 2

## 2016-06-19 MED ORDER — DIPHENHYDRAMINE HCL 25 MG PO CAPS: 25.0000 mg | ORAL_CAPSULE | Freq: Four times a day (QID) | ORAL | Status: DC | PRN

## 2016-06-19 MED ORDER — NITROFURANTOIN MONOHYD MACRO 100 MG PO CAPS
100.0000 mg | ORAL_CAPSULE | Freq: Once | ORAL | Status: AC
  Administered 2016-06-19: 100 mg via ORAL
  Filled 2016-06-19: qty 1

## 2016-06-19 MED ORDER — ONDANSETRON HCL 4 MG/2ML IJ SOLN
4.0000 mg | Freq: Once | INTRAMUSCULAR | Status: AC
  Administered 2016-06-19: 4 mg via INTRAVENOUS
  Filled 2016-06-19: qty 2

## 2016-06-19 MED ORDER — ONDANSETRON HCL 4 MG/2ML IJ SOLN: 4.0000 mg | Freq: Four times a day (QID) | INTRAMUSCULAR | Status: DC | PRN

## 2016-06-19 MED ORDER — FAMOTIDINE IN NACL 20-0.9 MG/50ML-% IV SOLN
20.0000 mg | Freq: Once | INTRAVENOUS | Status: AC
  Administered 2016-06-19: 20 mg via INTRAVENOUS
  Filled 2016-06-19: qty 50

## 2016-06-19 MED ORDER — ACETAMINOPHEN 650 MG RE SUPP: 650.0000 mg | Freq: Four times a day (QID) | RECTAL | Status: DC | PRN

## 2016-06-19 MED ORDER — ACETAMINOPHEN 325 MG PO TABS: 650.0000 mg | ORAL_TABLET | Freq: Four times a day (QID) | ORAL | Status: DC | PRN

## 2016-06-19 NOTE — ED Provider Notes (Signed)
[facility]-EMERG[facility]Y DEPT Provider Note   CSN: [id number] Arrival date & time: 06/19/16  1105  By signing my name below, I, Majel Homer, attest that this documentation has been prepared under the direction and in the presence of Raeford Razor, MD . Electronically Signed: Majel Homer, Scribe. 06/19/2016. 1:04 PM.  History   Chief Complaint Chief Complaint  Patient presents with  . Recurrent UTI   The history is provided by the patient. No language interpreter was used.   HPI Comments: Tracey Koch is a 70 y.o. female with PMHx of UTI and DM, who presents to the Emergency Department for an evaluation of a possible UTI. Pt reports she visited her PCP last week for urinary symptoms in which she had her urine cultured and was prescribed Cipro with no relief. She states she called her PCP again this morning to update him on her symptoms but didn't hear back from him so she decided to visit the ED. She notes her symptoms have not worsened but have "just not gotten any better." Pt now complains of persistent, left sided abdominal and back pain with associated dysuria, urinary frequency and hematuria.   PCP: Dr. Margo Common   Past Medical History:  Diagnosis Date  . Anemia    takes Folic Acid daily  . Arthritis    takes Plaquenil daily and Methotrexate 2 times a week  . Cough    takes Allegra daily  . Depression    takes Prozac at bedtime  . Diabetes mellitus without complication (HCC)    takes Metformin daily  . Family history of anesthesia complication    sister post op N/V  . History of blood transfusion   . History of UTI   . Hyperlipidemia    takes Simvastatin nightly  . Joint pain   . Joint swelling   . Pacemaker   . Seasonal allergies    takes Allegra daily  . Sinoatrial node dysfunction (HCC)    has ICD    Patient Active Problem List   Diagnosis Date Noted  . Tobacco abuse 09/18/2014  . Sepsis (HCC) 03/13/2014  . UTI (urinary tract infection) 03/13/2014  . Diabetes (HCC)  03/13/2014  . Osteoarthritis of left knee 05/08/2013  . Sinoatrial node dysfunction (HCC)   . HYPERTENSION, UNSPECIFIED 02/02/2009  . BRADYCARDIA 02/02/2009  . ARTHRITIS, RHEUMATOID 02/02/2009  . PACEMAKER, PERMANENT 02/02/2009    Past Surgical History:  Procedure Laterality Date  . ABDOMINAL HYSTERECTOMY    . CHOLECYSTECTOMY    . COLONOSCOPY    . EP IMPLANTABLE DEVICE N/A 08/06/2015   Procedure: PPM Generator Changeout;  Surgeon: Marinus Maw, MD;  Location: [facility] INVASIVE CV LAB;  Service: Cardiovascular;  Laterality: N/A;  . INSERT / REPLACE / REMOVE PACEMAKER    . right knee replacement    . TOTAL KNEE ARTHROPLASTY Left 05/06/2013   Dr Cleophas Dunker  . TOTAL KNEE ARTHROPLASTY Left 05/06/2013   Procedure: TOTAL KNEE ARTHROPLASTY;  Surgeon: [name], MD;  Location: [facility] OR;  Service: Orthopedics;  Laterality: Left;    OB History    No data available     Home Medications    Prior to Admission medications   Medication Sig Start Date End Date Taking? Authorizing Provider  glimepiride (AMARYL) 1 MG tablet Take 1 mg by mouth daily. 01/31/16   Historical Provider, MD  HYDROcodone-acetaminophen (NORCO/VICODIN) 5-325 MG per tablet Take 1 tablet by mouth every 6 (six) hours as needed for moderate pain.    Historical Provider, MD  metFORMIN (GLUCOPHAGE) 500 MG tablet Take 500 mg by mouth 2 (two) times daily with a meal.  07/09/12   Historical Provider, MD  omega-3 acid ethyl esters (LOVAZA) 1 G capsule Take by mouth 2 (two) times daily.    Historical Provider, MD  simvastatin (ZOCOR) 20 MG tablet Take 20 mg by mouth every evening.    Historical Provider, MD  solifenacin (VESICARE) 5 MG tablet Take 5 mg by mouth daily.    Historical Provider, MD  traZODone (DESYREL) 150 MG tablet Take 150 mg by mouth at bedtime.    Historical Provider, MD    Family History No family history on file.  Social History Social History  Substance Use Topics  . Smoking status: Current Every Day Smoker     Packs/day: 1.00    Years: 52.00    Types: Cigarettes    Start date: 03/01/1962    Last attempt to quit: 02/27/2014  . Smokeless tobacco: Never Used  . Alcohol use No     Allergies   Penicillins; Codeine; Levaquin [levofloxacin in d5w]; and Sulfa drugs cross reactors   Review of Systems Review of Systems  Genitourinary: Positive for dysuria, frequency and hematuria.  Musculoskeletal: Positive for back pain.   Physical Exam Updated Vital Signs BP 152/76 (BP Location: Left Arm)   Pulse 76   Temp 97.5 F (36.4 C) (Oral)   Resp 20   Ht 5\' 5"  (1.651 m)   Wt 188 lb (85.3 kg)   SpO2 98%   BMI 31.28 kg/m   Physical Exam  Constitutional: She is oriented to person, place, and time. She appears well-developed and well-nourished.  HENT:  Head: Normocephalic.  Eyes: EOM are normal.  Neck: Normal range of motion.  Pulmonary/Chest: Effort normal.  Abdominal: She exhibits no distension. There is tenderness.  Mild suprapubic tenderness.   Musculoskeletal: Normal range of motion.  Neurological: She is alert and oriented to person, place, and time.  Psychiatric: She has a normal mood and affect.  Nursing note and vitals reviewed.  ED Treatments / Results  Labs (all labs ordered are listed, but only abnormal results are displayed) Labs Reviewed  URINALYSIS, ROUTINE W REFLEX MICROSCOPIC (NOT AT [facility]) - Abnormal; Notable for the following:       Result Value   Hgb urine dipstick MODERATE (*)    Protein, ur TRACE (*)    Leukocytes, UA SMALL (*)    All other components within normal limits  URINE MICROSCOPIC-ADD ON - Abnormal; Notable for the following:    Bacteria, UA MANY (*)    All other components within normal limits  URINE CULTURE  CBC  BASIC METABOLIC PANEL    EKG  EKG Interpretation None       Radiology No results found.  Procedures Procedures (including critical care time)  Medications Ordered in ED Medications - No data to display  DIAGNOSTIC  STUDIES:  Oxygen Saturation is 98% on RA, normal by my interpretation.    COORDINATION OF CARE:  1:00 PM Discussed treatment plan with pt at bedside and pt agreed to plan.  Initial Impression / Assessment and Plan / ED Course  I have reviewed the triage vital signs and the nursing notes.  Pertinent labs & imaging results that were available during my care of the patient were reviewed by me and considered in my medical decision making (see chart for details).  Clinical Course    70yF with symptoms and UA consistent with UTI despite finishing course of cipro. I  can only presume organism is resistant to quinolones. She has allergies to multiple classes of antibiotics. She is afebrile and well appearing. She describes past history of what sounds like urosepsis and is understandably concerned about this. I think she should be fine to treat with macrobid at this time though. Will give first dose in ED.  I personally performed the services described in this documentation, which was scribed in my presence. The recorded information has been reviewed and is accurate.   Final Clinical Impressions(s) / ED Diagnoses   Final diagnoses:  Acute cystitis with hematuria    New Prescriptions New Prescriptions   No medications on file     Raeford Razor, MD 06/29/16 1438

## 2016-06-19 NOTE — ED Triage Notes (Signed)
Pt states she took cipro last week for UTI, continues to have symptoms, burning when voiding

## 2016-06-19 NOTE — ED Provider Notes (Signed)
[facility] DEPT Provider Note   CSN: [id number] Arrival date & time: 06/19/16  1747  By signing my name below, I, Placido Sou, attest that this documentation has been prepared under the direction and in the presence of Raeford Razor, MD. Electronically Signed: Placido Sou, ED Scribe. 06/19/16. 6:40 PM.   History   Chief Complaint Chief Complaint  Patient presents with  . Allergic Reaction    HPI HPI Comments: Tracey Koch is a 70 y.o. female who presents to the Emergency Department due to an allergic reaction which occurred PTA. Pt was seen earlier today with persistent left sided abdominal pain, back pain, dysuria, increased urinary frequency and hematuria from a previously dx UTI. She was initially treated with Cipro w/o relief and was treated today with Macrobid-first dose given in the ED. Once she went home she began experiencing diarrhea, nausea, vomiting, a diffuse rash across her body and mild SOB. Pt reports associated redness and itchiness across her rash. Pt confirms her listed PCP. She denies dizziness, lightheadedness and wheezing.    The history is provided by the patient and medical records. No language interpreter was used.    Past Medical History:  Diagnosis Date  . Anemia    takes Folic Acid daily  . Arthritis    takes Plaquenil daily and Methotrexate 2 times a week  . Cough    takes Allegra daily  . Depression    takes Prozac at bedtime  . Diabetes mellitus without complication (HCC)    takes Metformin daily  . Family history of anesthesia complication    sister post op N/V  . History of blood transfusion   . History of UTI   . Hyperlipidemia    takes Simvastatin nightly  . Joint pain   . Joint swelling   . Pacemaker   . Seasonal allergies    takes Allegra daily  . Sinoatrial node dysfunction (HCC)    has ICD    Patient Active Problem List   Diagnosis Date Noted  . Tobacco abuse 09/18/2014  . Sepsis (HCC) 03/13/2014  . UTI  (urinary tract infection) 03/13/2014  . Diabetes (HCC) 03/13/2014  . Osteoarthritis of left knee 05/08/2013  . Sinoatrial node dysfunction (HCC)   . HYPERTENSION, UNSPECIFIED 02/02/2009  . BRADYCARDIA 02/02/2009  . ARTHRITIS, RHEUMATOID 02/02/2009  . PACEMAKER, PERMANENT 02/02/2009    Past Surgical History:  Procedure Laterality Date  . ABDOMINAL HYSTERECTOMY    . CHOLECYSTECTOMY    . COLONOSCOPY    . EP IMPLANTABLE DEVICE N/A 08/06/2015   Procedure: PPM Generator Changeout;  Surgeon: Marinus Maw, MD;  Location: [facility] INVASIVE CV LAB;  Service: Cardiovascular;  Laterality: N/A;  . INSERT / REPLACE / REMOVE PACEMAKER    . right knee replacement    . TOTAL KNEE ARTHROPLASTY Left 05/06/2013   Dr Cleophas Dunker  . TOTAL KNEE ARTHROPLASTY Left 05/06/2013   Procedure: TOTAL KNEE ARTHROPLASTY;  Surgeon: [name], MD;  Location: [facility] OR;  Service: Orthopedics;  Laterality: Left;    OB History    No data available       Home Medications    Prior to Admission medications   Medication Sig Start Date End Date Taking? Authorizing Provider  glimepiride (AMARYL) 1 MG tablet Take 1 mg by mouth daily. 01/31/16   Historical Provider, MD  HYDROcodone-acetaminophen (NORCO/VICODIN) 5-325 MG per tablet Take 1 tablet by mouth every 6 (six) hours as needed for moderate pain.    Historical Provider, MD  metFORMIN (GLUCOPHAGE) 500  MG tablet Take 500 mg by mouth 2 (two) times daily with a meal.  07/09/12   Historical Provider, MD  nitrofurantoin, macrocrystal-monohydrate, (MACROBID) 100 MG capsule Take 1 capsule (100 mg total) by mouth 2 (two) times daily. 06/19/16   Raeford Razor, MD  omega-3 acid ethyl esters (LOVAZA) 1 G capsule Take by mouth 2 (two) times daily.    Historical Provider, MD  simvastatin (ZOCOR) 20 MG tablet Take 20 mg by mouth every evening.    Historical Provider, MD  solifenacin (VESICARE) 5 MG tablet Take 5 mg by mouth daily.    Historical Provider, MD  traZODone (DESYREL) 150 MG  tablet Take 150 mg by mouth at bedtime.    Historical Provider, MD    Family History History reviewed. No pertinent family history.  Social History Social History  Substance Use Topics  . Smoking status: Current Every Day Smoker    Packs/day: 1.00    Years: 52.00    Types: Cigarettes    Start date: 03/01/1962    Last attempt to quit: 02/27/2014  . Smokeless tobacco: Never Used  . Alcohol use No     Allergies   Penicillins; Codeine; Levaquin [levofloxacin in d5w]; and Sulfa drugs cross reactors   Review of Systems Review of Systems  Respiratory: Positive for shortness of breath. Negative for wheezing.   Gastrointestinal: Positive for diarrhea, nausea and vomiting.  Skin: Positive for color change and rash.  Neurological: Negative for dizziness and light-headedness.  All other systems reviewed and are negative.  Physical Exam Updated Vital Signs BP 148/73 (BP Location: Left Arm)   Pulse 84   Temp 97.7 F (36.5 C) (Oral)   Resp 26   Ht 5\' 5"  (1.651 m)   Wt 188 lb (85.3 kg)   BMI 31.28 kg/m   Physical Exam  Constitutional: She is oriented to person, place, and time. She appears well-developed and well-nourished. No distress.  HENT:  Head: Normocephalic and atraumatic.  Eyes: EOM are normal.  Neck: Normal range of motion.  Cardiovascular: Normal rate, regular rhythm and normal heart sounds.   Pulmonary/Chest: Effort normal. She has wheezes (faint expiratory).  Abdominal: Soft. She exhibits no distension. There is tenderness (mild suprapubic).  Musculoskeletal: Normal range of motion.  Neurological: She is alert and oriented to person, place, and time.  Skin: Skin is warm and dry. Rash noted. There is erythema.  Diffuse urticarial rash  Psychiatric: She has a normal mood and affect. Judgment normal.  Nursing note and vitals reviewed.  ED Treatments / Results  Labs (all labs ordered are listed, but only abnormal results are displayed) Labs Reviewed - No data to  display  EKG  EKG Interpretation None       Radiology No results found.  Procedures Procedures  COORDINATION OF CARE: 6:40 PM Discussed next steps with pt. Pt verbalized understanding and is agreeable with the plan.    Medications Ordered in ED Medications - No data to display   Initial Impression / Assessment and Plan / ED Course  I have reviewed the triage vital signs and the nursing notes.  Pertinent labs & imaging results that were available during my care of the patient were reviewed by me and considered in my medical decision making (see chart for details).  Clinical Course     70yF with UTI I just diagnosed on ED visit earlier today and now with symptoms consistent with allergic reaction. At this point, will admit.   Final Clinical Impressions(s) / ED Diagnoses  Final diagnoses:  Allergic reaction, initial encounter  Acute cystitis with hematuria    New Prescriptions New Prescriptions   No medications on file     Raeford Razor, MD 06/29/16 1434

## 2016-06-19 NOTE — H&P (Signed)
History and Physical    Tracey Koch ZOX:[id number] DOB: 11-14-1945 DOA: 06/19/2016  PCP: Louie Boston, MD Patient coming from: Home   Chief Complaint: Rash.   HPI: Tracey Koch is a 70 y.o. female with medical history significant of arthritis, DM, multiples allergies to antibiotics, Sino nodal dysfunction S/P pacemaker who presents early today to the ED with persistent symptoms consistent with UTI. She was prescribe ciprofloxacin by her PCP. She was prescribe Macrobid in the ED and discharge home. She presents tonight with nausea, vomiting, diarrhea and generalized rash. She was diagnosed with allergic reaction. She report lower back pain, dysuria, supra pubic pain. Report mild low grade fever at home. She relates rash has resolved. She is feeling better   ED Course: presents with UTI symptoms and now rash. Normal WBC, Sodium mildly low at 132, glucose 144, UA with to numerous to count WBC> received solumedrol, benadryl, pepcid.   Review of Systems: As per HPI otherwise 10 point review of systems negative.    Past Medical History:  Diagnosis Date  . Anemia    takes Folic Acid daily  . Arthritis    takes Plaquenil daily and Methotrexate 2 times a week  . Cough    takes Allegra daily  . Depression    takes Prozac at bedtime  . Diabetes mellitus without complication (HCC)    takes Metformin daily  . Family history of anesthesia complication    sister post op N/V  . History of blood transfusion   . History of UTI   . Hyperlipidemia    takes Simvastatin nightly  . Joint pain   . Joint swelling   . Pacemaker   . Seasonal allergies    takes Allegra daily  . Sinoatrial node dysfunction (HCC)    has ICD    Past Surgical History:  Procedure Laterality Date  . ABDOMINAL HYSTERECTOMY    . CHOLECYSTECTOMY    . COLONOSCOPY    . EP IMPLANTABLE DEVICE N/A 08/06/2015   Procedure: PPM Generator Changeout;  Surgeon: Marinus Maw, MD;  Location: [facility] INVASIVE CV LAB;  Service:  Cardiovascular;  Laterality: N/A;  . INSERT / REPLACE / REMOVE PACEMAKER    . right knee replacement    . TOTAL KNEE ARTHROPLASTY Left 05/06/2013   Dr Cleophas Dunker  . TOTAL KNEE ARTHROPLASTY Left 05/06/2013   Procedure: TOTAL KNEE ARTHROPLASTY;  Surgeon: [name], MD;  Location: [facility] OR;  Service: Orthopedics;  Laterality: Left;     reports that she has been smoking Cigarettes.  She started smoking about 54 years ago. She has a 52.00 pack-year smoking history. She has never used smokeless tobacco. She reports that she does not drink alcohol or use drugs.  Allergies  Allergen Reactions  . Penicillins Shortness Of Breath, Itching, Swelling and Rash    Has patient had a PCN reaction causing immediate rash, facial/tongue/throat swelling, SOB or lightheadedness with hypotension: Yes Has patient had a PCN reaction causing severe rash involving mucus membranes or skin necrosis: No Has patient had a PCN reaction that required hospitalization No Has patient had a PCN reaction occurring within the last 10 years: No If all of the above answers are "NO", then may proceed with Cephalosporin use.   Marland Kitchen Macrobid [Nitrofurantoin] Hives, Diarrhea, Itching and Nausea And Vomiting  . Codeine Itching, Swelling and Rash  . Levaquin [Levofloxacin In D5w] Itching, Swelling and Other (See Comments)    Can take low dose  . Sulfa Drugs Cross Reactors Itching, Swelling and  Rash    Family history; Mother; Nose cancer, father ; stroke, MI, aneurysmal.   Prior to Admission medications   Medication Sig Start Date End Date Taking? Authorizing Provider  cephALEXin (KEFLEX) 500 MG capsule Take 500 mg by mouth 2 (two) times daily.  06/19/16  Yes Historical Provider, MD  HYDROcodone-acetaminophen (NORCO) 7.5-325 MG tablet Take 1 tablet by mouth every 6 (six) hours as needed for moderate pain.  06/14/16  Yes Historical Provider, MD  metFORMIN (GLUCOPHAGE) 500 MG tablet Take 1,000 mg by mouth 2 (two) times daily with a  meal.  07/09/12  Yes Historical Provider, MD  nitrofurantoin, macrocrystal-monohydrate, (MACROBID) 100 MG capsule Take 1 capsule (100 mg total) by mouth 2 (two) times daily. 06/19/16  Yes Raeford Razor, MD  Omega-3 Fatty Acids (OMEGA-3 FISH OIL PO) Take 1 capsule by mouth 2 (two) times daily. OMEGA XL   Yes Historical Provider, MD  simvastatin (ZOCOR) 20 MG tablet Take 20 mg by mouth every evening.   Yes Historical Provider, MD  solifenacin (VESICARE) 5 MG tablet Take 5 mg by mouth daily.   Yes Historical Provider, MD  traZODone (DESYREL) 150 MG tablet Take 150 mg by mouth at bedtime.   Yes Historical Provider, MD  ciprofloxacin (CIPRO) 500 MG tablet  06/12/16   Historical Provider, MD    Physical Exam: Vitals:   06/19/16 1800 06/19/16 1900 06/19/16 1930 06/19/16 2000  BP: 148/73 141/75 130/71 136/71  Pulse: 84 80 82 80  Resp: 26 17 17 17   Temp: 97.7 F (36.5 C)     TempSrc: Oral     SpO2: 97% 98% 97% 98%  Weight:      Height:          Constitutional: NAD, calm, comfortable Vitals:   06/19/16 1800 06/19/16 1900 06/19/16 1930 06/19/16 2000  BP: 148/73 141/75 130/71 136/71  Pulse: 84 80 82 80  Resp: 26 17 17 17   Temp: 97.7 F (36.5 C)     TempSrc: Oral     SpO2: 97% 98% 97% 98%  Weight:      Height:       Eyes: PERRL, lids and conjunctivae normal ENMT: Mucous membranes are moist. Posterior pharynx clear of any exudate or lesions.Normal dentition.  Neck: normal, supple, no masses, no thyromegaly Respiratory: clear to auscultation bilaterally, no wheezing, no crackles. Normal respiratory effort. No accessory muscle use.  Cardiovascular: Regular rate and rhythm, no murmurs / rubs / gallops. No extremity edema. 2+ pedal pulses. No carotid bruits.  Abdomen: no tenderness, no masses palpated. No hepatosplenomegaly. Bowel sounds positive.  Musculoskeletal: no clubbing / cyanosis. No joint deformity upper and lower extremities. Good ROM, no contractures. Normal muscle tone.  Skin:  no rashes, lesions, ulcers. No induration Neurologic: CN 2-12 grossly intact. Sensation intact, DTR normal. Strength 5/5 in all 4.  Psychiatric: Normal judgment and insight. Alert and oriented x 3. Normal mood.    Labs on Admission: I have personally reviewed following labs and imaging studies  CBC:  Recent Labs Lab 06/19/16 1239  WBC 6.4  HGB 12.7  HCT 37.8  MCV 81.1  PLT 177   Basic Metabolic Panel:  Recent Labs Lab 06/19/16 1239  NA 132*  K 4.1  CL 101  CO2 24  GLUCOSE 144*  BUN 15  CREATININE 0.90  CALCIUM 9.0   GFR: Estimated Creatinine Clearance: 61.8 mL/min (by C-G formula based on SCr of 0.9 mg/dL). Liver Function Tests: No results for input(s): AST, ALT, ALKPHOS, BILITOT, PROT,  ALBUMIN in the last 168 hours. No results for input(s): LIPASE, AMYLASE in the last 168 hours. No results for input(s): AMMONIA in the last 168 hours. Coagulation Profile: No results for input(s): INR, PROTIME in the last 168 hours. Cardiac Enzymes: No results for input(s): CKTOTAL, CKMB, CKMBINDEX, TROPONINI in the last 168 hours. BNP (last 3 results) No results for input(s): PROBNP in the last 8760 hours. HbA1C: No results for input(s): HGBA1C in the last 72 hours. CBG: No results for input(s): GLUCAP in the last 168 hours. Lipid Profile: No results for input(s): CHOL, HDL, LDLCALC, TRIG, CHOLHDL, LDLDIRECT in the last 72 hours. Thyroid Function Tests: No results for input(s): TSH, T4TOTAL, FREET4, T3FREE, THYROIDAB in the last 72 hours. Anemia Panel: No results for input(s): VITAMINB12, FOLATE, FERRITIN, TIBC, IRON, RETICCTPCT in the last 72 hours. Urine analysis:    Component Value Date/Time   COLORURINE YELLOW 06/19/2016 1123   APPEARANCEUR CLEAR 06/19/2016 1123   LABSPEC 1.010 06/19/2016 1123   PHURINE 6.0 06/19/2016 1123   GLUCOSEU NEGATIVE 06/19/2016 1123   HGBUR MODERATE (A) 06/19/2016 1123   BILIRUBINUR NEGATIVE 06/19/2016 1123   KETONESUR NEGATIVE 06/19/2016  1123   PROTEINUR TRACE (A) 06/19/2016 1123   UROBILINOGEN 1.0 03/13/2014 1811   NITRITE NEGATIVE 06/19/2016 1123   LEUKOCYTESUR SMALL (A) 06/19/2016 1123   Sepsis Labs: !!!!!!!!!!!!!!!!!!!!!!!!!!!!!!!!!!!!!!!!!!!! @LABRCNTIP (procalcitonin:4,lacticidven:4) )No results found for this or any previous visit (from the past 240 hour(s)).   Radiological Exams on Admission: No results found.  EKG; none available/   Assessment/Plan Active Problems:   Essential hypertension   PACEMAKER, PERMANENT   UTI (urinary tract infection)   Diabetes (HCC)   Allergic reaction caused by a drug  1-Allergic reaction; to probably Macrobid. Presents with generalized rash, itching, nausea vomiting, diarrhea. .  received IV solumedrol, benadryl and Pepcid.  Will continue with benadryl PRN,., IV solumedrol and Pepcid.  Patient with wheezing. Will start Albuterol.   2-UTI; UA with to numerous to count WBC.  Will request urine culture from PCP office.  Follow urine culture done in the ED.  Continue with aztreonam.   3-DM; hold metformin. SSI ordered.   4-Hyponatremia; IV fluids.   DVT prophylaxis: Lovenox Code Status: Partial code, does not wish to be intubated in any circumstance.  Family Communication: husband and  grad-daughter at bedside.  Disposition Plan: suspect Home in 24 to 48 hours.  Consults called: none Admission status: Observation, med-surgery/    MD [facility] Pager [phone]  If 7PM-7AM, please contact night-coverage [email or web address] Password [email or web address]  06/19/2016, 8:15 PM

## 2016-06-19 NOTE — ED Triage Notes (Signed)
Patient was treated here earlier today for UTI and given Macrobid. Patient now complaining of vomiting, diarrhea, itching, and hives that started after she got home from ER.

## 2016-06-19 NOTE — Progress Notes (Addendum)
Pharmacy Note:  Initial antibiotics for UTI ordered by EDP for Aztreonam.  Estimated Creatinine Clearance: 61.8 mL/min (by C-G formula based on SCr of 0.9 mg/dL).   Allergies  Allergen Reactions  . Penicillins Shortness Of Breath, Itching, Swelling and Rash    Has patient had a PCN reaction causing immediate rash, facial/tongue/throat swelling, SOB or lightheadedness with hypotension: Yes Has patient had a PCN reaction causing severe rash involving mucus membranes or skin necrosis: No Has patient had a PCN reaction that required hospitalization No Has patient had a PCN reaction occurring within the last 10 years: No If all of the above answers are "NO", then may proceed with Cephalosporin use.   . Codeine Itching, Swelling and Rash  . Levaquin [Levofloxacin In D5w] Itching, Swelling and Other (See Comments)    Can take low dose  . Sulfa Drugs Cross Reactors Itching, Swelling and Rash    Vitals:   06/19/16 1800  BP: 148/73  Pulse: 84  Resp: 26  Temp: 97.7 F (36.5 C)    Anti-infectives    Start     Dose/Rate Route Frequency Ordered Stop   06/19/16 1900  aztreonam (AZACTAM) 2 g in dextrose 5 % 50 mL IVPB     2 g 100 mL/hr over 30 Minutes Intravenous  Once 06/19/16 1900        Plan: Initial doses of Aztreonam 2gm X 1 ordered. Aztreonam 1gm IV every 8 hours. F/U admission orders / plans for finalized antibiotic plans if Aztreonam not continued.  Mady Gemma, [facility] 06/19/2016 7:02 PM

## 2016-06-20 DIAGNOSIS — E785 Hyperlipidemia, unspecified: Secondary | ICD-10-CM | POA: Diagnosis present

## 2016-06-20 DIAGNOSIS — Z882 Allergy status to sulfonamides status: Secondary | ICD-10-CM | POA: Diagnosis not present

## 2016-06-20 DIAGNOSIS — Z885 Allergy status to narcotic agent status: Secondary | ICD-10-CM | POA: Diagnosis not present

## 2016-06-20 DIAGNOSIS — T378X5A Adverse effect of other specified systemic anti-infectives and antiparasitics, initial encounter: Secondary | ICD-10-CM | POA: Diagnosis present

## 2016-06-20 DIAGNOSIS — R112 Nausea with vomiting, unspecified: Secondary | ICD-10-CM | POA: Diagnosis present

## 2016-06-20 DIAGNOSIS — Z950 Presence of cardiac pacemaker: Secondary | ICD-10-CM

## 2016-06-20 DIAGNOSIS — L270 Generalized skin eruption due to drugs and medicaments taken internally: Secondary | ICD-10-CM | POA: Diagnosis present

## 2016-06-20 DIAGNOSIS — E871 Hypo-osmolality and hyponatremia: Secondary | ICD-10-CM | POA: Diagnosis not present

## 2016-06-20 DIAGNOSIS — N3001 Acute cystitis with hematuria: Secondary | ICD-10-CM | POA: Diagnosis present

## 2016-06-20 DIAGNOSIS — T7840XS Allergy, unspecified, sequela: Secondary | ICD-10-CM

## 2016-06-20 DIAGNOSIS — E119 Type 2 diabetes mellitus without complications: Secondary | ICD-10-CM

## 2016-06-20 DIAGNOSIS — R062 Wheezing: Secondary | ICD-10-CM | POA: Diagnosis present

## 2016-06-20 DIAGNOSIS — Z880 Allergy status to penicillin: Secondary | ICD-10-CM | POA: Diagnosis not present

## 2016-06-20 DIAGNOSIS — Z881 Allergy status to other antibiotic agents status: Secondary | ICD-10-CM | POA: Diagnosis not present

## 2016-06-20 DIAGNOSIS — D649 Anemia, unspecified: Secondary | ICD-10-CM

## 2016-06-20 DIAGNOSIS — Y92009 Unspecified place in unspecified non-institutional (private) residence as the place of occurrence of the external cause: Secondary | ICD-10-CM | POA: Diagnosis not present

## 2016-06-20 DIAGNOSIS — Z23 Encounter for immunization: Secondary | ICD-10-CM | POA: Diagnosis present

## 2016-06-20 DIAGNOSIS — K521 Toxic gastroenteritis and colitis: Secondary | ICD-10-CM | POA: Diagnosis present

## 2016-06-20 DIAGNOSIS — N390 Urinary tract infection, site not specified: Secondary | ICD-10-CM

## 2016-06-20 DIAGNOSIS — T7840XA Allergy, unspecified, initial encounter: Secondary | ICD-10-CM | POA: Diagnosis present

## 2016-06-20 DIAGNOSIS — Z7984 Long term (current) use of oral hypoglycemic drugs: Secondary | ICD-10-CM | POA: Diagnosis not present

## 2016-06-20 DIAGNOSIS — I10 Essential (primary) hypertension: Secondary | ICD-10-CM | POA: Diagnosis present

## 2016-06-20 DIAGNOSIS — Z87440 Personal history of urinary (tract) infections: Secondary | ICD-10-CM | POA: Diagnosis not present

## 2016-06-20 DIAGNOSIS — T7840XD Allergy, unspecified, subsequent encounter: Secondary | ICD-10-CM | POA: Diagnosis not present

## 2016-06-20 LAB — GLUCOSE, CAPILLARY
GLUCOSE-CAPILLARY: 298 mg/dL — AB (ref 65–99)
GLUCOSE-CAPILLARY: 204 mg/dL — AB (ref 65–99)
Glucose-Capillary: 365 mg/dL — ABNORMAL HIGH (ref 65–99)
Glucose-Capillary: 181 mg/dL — ABNORMAL HIGH (ref 65–99)

## 2016-06-20 LAB — BASIC METABOLIC PANEL
ANION GAP: 9 (ref 5–15)
BUN: 19 mg/dL (ref 6–20)
CHLORIDE: 101 mmol/L (ref 101–111)
CO2: 22 mmol/L (ref 22–32)
CREATININE: 1.1 mg/dL — AB (ref 0.44–1.00)
Calcium: 8.4 mg/dL — ABNORMAL LOW (ref 8.9–10.3)
GFR calc non Af Amer: 49 mL/min — ABNORMAL LOW (ref >60)
GFR, EST AFRICAN AMERICAN: 57 mL/min — AB (ref >60)
Glucose, Bld: 373 mg/dL — ABNORMAL HIGH (ref 65–99)
Potassium: 4 mmol/L (ref 3.5–5.1)
SODIUM: 132 mmol/L — AB (ref 135–145)

## 2016-06-20 LAB — CBC
HCT: 33.5 % — ABNORMAL LOW (ref 36.0–46.0)
HEMOGLOBIN: 11.1 g/dL — AB (ref 12.0–15.0)
MCH: 26.9 pg (ref 26.0–34.0)
MCHC: 33.1 g/dL (ref 30.0–36.0)
MCV: 81.3 fL (ref 78.0–100.0)
PLATELETS: 155 10*3/uL (ref 150–400)
RBC: 4.12 MIL/uL (ref 3.87–5.11)
RDW: 14.5 % (ref 11.5–15.5)
WBC: 6.3 10*3/uL (ref 4.0–10.5)

## 2016-06-20 MED ORDER — INSULIN ASPART 100 UNIT/ML ~~LOC~~ SOLN
0.0000 [IU] | Freq: Three times a day (TID) | SUBCUTANEOUS | Status: DC
  Administered 2016-06-20: 7 [IU] via SUBCUTANEOUS
  Administered 2016-06-20: 20 [IU] via SUBCUTANEOUS
  Administered 2016-06-21: 3 [IU] via SUBCUTANEOUS

## 2016-06-20 MED ORDER — INSULIN ASPART 100 UNIT/ML ~~LOC~~ SOLN: 0.0000 [IU] | Freq: Every day | SUBCUTANEOUS | Status: DC

## 2016-06-20 MED ORDER — ALBUTEROL SULFATE (2.5 MG/3ML) 0.083% IN NEBU: 2.5000 mg | INHALATION_SOLUTION | Freq: Four times a day (QID) | RESPIRATORY_TRACT | Status: DC | PRN

## 2016-06-20 MED ORDER — INSULIN GLARGINE 100 UNIT/ML ~~LOC~~ SOLN
10.0000 [IU] | Freq: Two times a day (BID) | SUBCUTANEOUS | Status: DC
  Administered 2016-06-20 – 2016-06-21 (×3): 10 [IU] via SUBCUTANEOUS
  Filled 2016-06-20 (×7): qty 0.1

## 2016-06-20 MED ORDER — ALBUTEROL SULFATE (2.5 MG/3ML) 0.083% IN NEBU
2.5000 mg | INHALATION_SOLUTION | Freq: Two times a day (BID) | RESPIRATORY_TRACT | Status: DC
Start: 2016-06-20 — End: 2016-06-20
  Filled 2016-06-20: qty 3

## 2016-06-20 NOTE — Care Management Obs Status (Signed)
MEDICARE OBSERVATION STATUS NOTIFICATION   Patient Details  Name: Tracey Koch MRN: [id number] Date of Birth: 07-12-1946   Medicare Observation Status Notification Given:  Yes    Malcolm Metro, RN 06/20/2016, 2:37 PM

## 2016-06-20 NOTE — Progress Notes (Signed)
PROGRESS NOTE    Tracey Koch  WUJ:[id number] DOB: 1946/04/27 DOA: 06/19/2016 PCP: Louie Boston, MD    Brief Narrative:  Patient is a 70 year old woman with a history of frequent UTIs, DJD, DM, multiple allergies to antibiotics, status post pacemaker, who presented to the ED on 06/19/16 with complaint of a diffuse rash, nausea, vomiting, and diarrhea. She had been recently started on treatment with Macrobid following a course of Cipro for UTI. After finishing the course of Cipro, she developed another symptomatic urinary tract infection. When she started taking Macrobid, she began to have N/V/D and broke out into a generalized rash. She denied difficulty swallowing or breathing. In the ED, she was afebrile and hemodynamically stable. Her urinalysis was grossly positive for infection. She was admitted for further evaluation and management.   Assessment & Plan:   Principal Problem:   Allergic reaction caused by a drug Active Problems:   UTI (urinary tract infection)   Hyponatremia   Essential hypertension   PACEMAKER, PERMANENT   Type 2 diabetes mellitus without complication (HCC)   Normocytic anemia   1. Allergic reaction from Macrobid. Patient was given IV Solu-Medrol, Pepcid, and Benadryl in the ED. She also had some wheezing, so albuterol nebulizer was started. Her GI symptoms and rash have resolved. -Solu-Medrol was discontinued. We'll continue Benadryl and Pepcid when necessary.  UTI. Patient has multiple antibiotic allergies. She was treated with Cipro a few weeks ago for symptomatic UTI, but her symptoms returned. Due to the Macrobid allergy, she was started on IV aztreonam. She has tolerated it without any side effects. Urine culture was ordered, but is pending. -Due to her frequent UTIs, would favor keeping her another 24 hours for IV antibiotics. It will be difficult to decide what antibiotic to discharge her on without the results of the urine culture and with her  multiple allergies. Oral Levaquin may be a possibility although apparently she had a reaction to IV Levaquin in the past.  Type 2 diabetes. Patient is treated chronically with metformin. It was held on admission. She was started on sliding-scale NovoLog. -Due to the IV Solu-Medrol, her CBGs have been uncontrolled. Therefore, sliding-scale NovoLog was increased to the resistant scale and Lantus was ordered twice a day. -We'll restart metformin as there is no evidence of acidosis or renal failure.  Mild hyponatremia. We'll continue normal saline started and repeat check her serum sodium tomorrow.    DVT prophylaxis: Lovenox Code Status: Full code Family Communication: Discussed with husband Disposition Plan: Discharged home in the next 24-48 hours   Consultants:   None  Procedures:   None  Antimicrobials:  Aztreonam 06/19/16>>    Subjective: Patient says the rash is gone. She denies difficulty swallowing or breathing. She has a little less pain/discomfort with urination, but it has not completely resolved.  Objective: Vitals:   06/19/16 2250 06/20/16 0210 06/20/16 0439 06/20/16 0837  BP: (!) 152/66  139/60   Pulse: 87  93   Resp: 20  18   Temp: 98.5 F (36.9 C)  98 F (36.7 C)   TempSrc: Oral  Oral   SpO2: 96% 94% 95% 95%  Weight: 85.3 kg (188 lb)     Height:        Intake/Output Summary (Last 24 hours) at 06/20/16 1428 Last data filed at 06/20/16 1300  Gross per 24 hour  Intake              400 ml  Output  0 ml  Net              400 ml   Filed Weights   06/19/16 1758 06/19/16 2250  Weight: 85.3 kg (188 lb) 85.3 kg (188 lb)    Examination:  General exam: Appears calm and comfortable  Respiratory system: Clear to auscultation. Respiratory effort normal. Cardiovascular system: S1 & S2 heard, RRR. No JVD, murmurs, rubs, gallops or clicks. No pedal edema. Gastrointestinal system: Abdomen is nondistended, soft and nontender. No organomegaly or  masses felt. Normal bowel sounds heard. Central nervous system: Alert and oriented. No focal neurological deficits. Extremities: Symmetric 5 x 5 power. Skin: No rashes, lesions or ulcers Psychiatry: Judgement and insight appear normal. Mood & affect appropriate.     Data Reviewed: I have personally reviewed following labs and imaging studies  CBC:  Recent Labs Lab 06/19/16 1239 06/20/16 0537  WBC 6.4 6.3  HGB 12.7 11.1*  HCT 37.8 33.5*  MCV 81.1 81.3  PLT 177 155   Basic Metabolic Panel:  Recent Labs Lab 06/19/16 1239 06/20/16 0537  NA 132* 132*  K 4.1 4.0  CL 101 101  CO2 24 22  GLUCOSE 144* 373*  BUN 15 19  CREATININE 0.90 1.10*  CALCIUM 9.0 8.4*   GFR: Estimated Creatinine Clearance: 50.6 mL/min (by C-G formula based on SCr of 1.1 mg/dL (H)). Liver Function Tests: No results for input(s): AST, ALT, ALKPHOS, BILITOT, PROT, ALBUMIN in the last 168 hours. No results for input(s): LIPASE, AMYLASE in the last 168 hours. No results for input(s): AMMONIA in the last 168 hours. Coagulation Profile: No results for input(s): INR, PROTIME in the last 168 hours. Cardiac Enzymes: No results for input(s): CKTOTAL, CKMB, CKMBINDEX, TROPONINI in the last 168 hours. BNP (last 3 results) No results for input(s): PROBNP in the last 8760 hours. HbA1C: No results for input(s): HGBA1C in the last 72 hours. CBG:  Recent Labs Lab 06/19/16 2301 06/20/16 0742 06/20/16 1119  GLUCAP 313* 298* 365*   Lipid Profile: No results for input(s): CHOL, HDL, LDLCALC, TRIG, CHOLHDL, LDLDIRECT in the last 72 hours. Thyroid Function Tests: No results for input(s): TSH, T4TOTAL, FREET4, T3FREE, THYROIDAB in the last 72 hours. Anemia Panel: No results for input(s): VITAMINB12, FOLATE, FERRITIN, TIBC, IRON, RETICCTPCT in the last 72 hours. Sepsis Labs: No results for input(s): PROCALCITON, LATICACIDVEN in the last 168 hours.  No results found for this or any previous visit (from the past  240 hour(s)).       Radiology Studies: No results found.      Scheduled Meds: . albuterol  2.5 mg Nebulization BID  . aztreonam  1 g Intravenous Q8H  . enoxaparin (LOVENOX) injection  40 mg Subcutaneous Q24H  . famotidine  20 mg Oral BID  . Influenza vac split quadrivalent PF  0.5 mL Intramuscular Tomorrow-1000  . insulin aspart  0-20 Units Subcutaneous TID WC  . insulin aspart  0-5 Units Subcutaneous QHS  . insulin glargine  10 Units Subcutaneous BID  . simvastatin  20 mg Oral QPM  . traZODone  150 mg Oral QHS   Continuous Infusions: . sodium chloride 75 mL/hr at 06/20/16 1352     LOS: 0 days    Time spent: 30 minutes    Elliot Cousin, MD [facility] Pager [phone]  If 7PM-7AM, please contact night-coverage [email or web address] Password [email or web address] 06/20/2016, 2:28 PM

## 2016-06-20 NOTE — Care Management Note (Signed)
Case Management Note  Patient Details  Name: Tracey Koch MRN: [id number] Date of Birth: [birth year]   Subjective/Objective:                  Pt admitted with allergic reaction to Abx. Pt is from home, lives with her husband and is ind with ADL's. She uses can with ambulation but has walker, WC, BSC, and neb machine PTA. She has had HH services in the past but is not currently active. She has PCP, transportation and no difficulty affording or managing medications.   Action/Plan: Pt plans to return home with self care. No CM needs anticipated.   Expected Discharge Date:     06/21/2016             Expected Discharge Plan:  Home/Self Care  In-House Referral:  NA  Discharge planning Services  CM Consult  Post Acute Care Choice:  NA Choice offered to:  NA  Status of Service:  Completed, signed off  Malcolm Metro, RN 06/20/2016, 2:39 PM

## 2016-06-21 DIAGNOSIS — T7840XD Allergy, unspecified, subsequent encounter: Secondary | ICD-10-CM

## 2016-06-21 LAB — GLUCOSE, CAPILLARY: GLUCOSE-CAPILLARY: 143 mg/dL — AB (ref 65–99)

## 2016-06-21 LAB — BASIC METABOLIC PANEL
Anion gap: 7 (ref 5–15)
BUN: 24 mg/dL — AB (ref 6–20)
CO2: 23 mmol/L (ref 22–32)
CREATININE: 1.01 mg/dL — AB (ref 0.44–1.00)
Calcium: 8.6 mg/dL — ABNORMAL LOW (ref 8.9–10.3)
Chloride: 106 mmol/L (ref 101–111)
GFR, EST NON AFRICAN AMERICAN: 55 mL/min — AB (ref >60)
Glucose, Bld: 132 mg/dL — ABNORMAL HIGH (ref 65–99)
POTASSIUM: 4.1 mmol/L (ref 3.5–5.1)
SODIUM: 136 mmol/L (ref 135–145)

## 2016-06-21 LAB — CBC
HCT: 32.6 % — ABNORMAL LOW (ref 36.0–46.0)
Hemoglobin: 10.7 g/dL — ABNORMAL LOW (ref 12.0–15.0)
MCH: 27 pg (ref 26.0–34.0)
MCHC: 32.8 g/dL (ref 30.0–36.0)
MCV: 82.1 fL (ref 78.0–100.0)
PLATELETS: 164 10*3/uL (ref 150–400)
RBC: 3.97 MIL/uL (ref 3.87–5.11)
RDW: 14.8 % (ref 11.5–15.5)
WBC: 7.8 10*3/uL (ref 4.0–10.5)

## 2016-06-21 LAB — URINE CULTURE: Culture: >=100000 — AB

## 2016-06-21 MED ORDER — PREDNISONE 10 MG PO TABS: 10.0000 mg | ORAL_TABLET | Freq: Every day | ORAL | 0 refills | Status: DC

## 2016-06-21 MED ORDER — FAMOTIDINE 20 MG PO TABS: 20.0000 mg | ORAL_TABLET | Freq: Two times a day (BID) | ORAL | 0 refills | Status: DC

## 2016-06-21 NOTE — Discharge Summary (Signed)
Physician Discharge Summary  Tracey Koch WGN:[id number] DOB: June 04, 1946 DOA: 06/19/2016  PCP: Louie Boston, MD  Admit date: 06/19/2016 Discharge date: 06/21/2016  Time spent: 45 minutes  Recommendations for Outpatient Follow-up:  -Will be discharged home today. -Advised to follow up with PCP in 2 weeks.   Discharge Diagnoses:  Principal Problem:   Allergic reaction caused by a drug Active Problems:   Essential hypertension   PACEMAKER, PERMANENT   UTI (urinary tract infection)   Type 2 diabetes mellitus without complication (HCC)   Normocytic anemia   Hyponatremia   Allergic reaction   Discharge Condition: Stable and improved  Filed Weights   06/19/16 1758 06/19/16 2250  Weight: 85.3 kg (188 lb) 85.3 kg (188 lb)    History of present illness:  As per Dr. Sunnie Nielsen on 11/20:  is a 70 y.o. female with medical history significant of arthritis, DM, multiples allergies to antibiotics, Sino nodal dysfunction S/P pacemaker who presents early today to the ED with persistent symptoms consistent with UTI. She was prescribe ciprofloxacin by her PCP. She was prescribe Macrobid in the ED and discharge home. She presents tonight with nausea, vomiting, diarrhea and generalized rash. She was diagnosed with allergic reaction. She report lower back pain, dysuria, supra pubic pain. Report mild low grade fever at home. She relates rash has resolved. She is feeling better   ED Course: presents with UTI symptoms and now rash. Normal WBC, Sodium mildly low at 132, glucose 144, UA with to numerous to count WBC> received solumedrol, benadryl, pepcid.   Hospital Course:   . Allergic reaction from Macrobid. Patient was given IV Solu-Medrol, Pepcid, and Benadryl in the ED. -Her symptoms and rash have resolved. -Will Dc on a prednisone taper and pepcid for 3 more days.  UTI. -She has no symptoms. -Will elect not to DC on any abx. -Could use keflex if needed in the future based on her  sensitivity data.  Type 2 diabetes. Continue metformin on DC  Procedures:  None   Consultations:  None  Discharge Instructions  Discharge Instructions    Increase activity slowly    Complete by:  As directed        Medication List    STOP taking these medications   ciprofloxacin 500 MG tablet Commonly known as:  CIPRO     TAKE these medications   famotidine 20 MG tablet Commonly known as:  PEPCID Take 1 tablet (20 mg total) by mouth 2 (two) times daily.   HYDROcodone-acetaminophen 7.5-325 MG tablet Commonly known as:  NORCO Take 1 tablet by mouth every 6 (six) hours as needed for moderate pain.   metFORMIN 500 MG tablet Commonly known as:  GLUCOPHAGE Take 1,000 mg by mouth 2 (two) times daily with a meal.   OMEGA-3 FISH OIL PO Take 1 capsule by mouth 2 (two) times daily. OMEGA XL   predniSONE 10 MG tablet Commonly known as:  DELTASONE Take 1 tablet (10 mg total) by mouth daily with breakfast. Take 6 tablets today and then decrease by 1 tablet daily until none are left.   simvastatin 20 MG tablet Commonly known as:  ZOCOR Take 20 mg by mouth every evening.   solifenacin 5 MG tablet Commonly known as:  VESICARE Take 5 mg by mouth daily.   traZODone 150 MG tablet Commonly known as:  DESYREL Take 150 mg by mouth at bedtime.      Allergies  Allergen Reactions  . Penicillins Shortness Of Breath, Itching, Swelling and  Rash    Has patient had a PCN reaction causing immediate rash, facial/tongue/throat swelling, SOB or lightheadedness with hypotension: Yes Has patient had a PCN reaction causing severe rash involving mucus membranes or skin necrosis: No Has patient had a PCN reaction that required hospitalization No Has patient had a PCN reaction occurring within the last 10 years: No If all of the above answers are "NO", then may proceed with Cephalosporin use.   Marland Kitchen Macrobid [Nitrofurantoin] Hives, Diarrhea, Itching and Nausea And Vomiting  . Codeine  Itching, Swelling and Rash  . Levaquin [Levofloxacin In D5w] Itching, Swelling and Other (See Comments)    Can take low dose  . Sulfa Drugs Cross Reactors Itching, Swelling and Rash   Follow-up Information    TAPPER,DAVID B, MD. Schedule an appointment as soon as possible for a visit in 2 week(s).   Specialty:  Family Medicine Contact information: [address] [place] [address] [phone]            The results of significant diagnostics from this hospitalization (including imaging, microbiology, ancillary and laboratory) are listed below for reference.    Significant Diagnostic Studies: No results found.  Microbiology: Recent Results (from the past 240 hour(s))  Urine culture     Status: Abnormal   Collection Time: 06/19/16 11:23 AM  Result Value Ref Range Status   Specimen Description URINE, CLEAN CATCH  Final   Special Requests NONE  Final   Culture >=100,000 COLONIES/mL ESCHERICHIA COLI (A)  Final   Report Status 06/21/2016 FINAL  Final   Organism ID, Bacteria ESCHERICHIA COLI (A)  Final      Susceptibility   Escherichia coli - MIC*    AMPICILLIN >=32 RESISTANT Resistant     CEFAZOLIN <=4 SENSITIVE Sensitive     CEFTRIAXONE <=1 SENSITIVE Sensitive     CIPROFLOXACIN >=4 RESISTANT Resistant     GENTAMICIN <=1 SENSITIVE Sensitive     IMIPENEM <=0.25 SENSITIVE Sensitive     NITROFURANTOIN <=16 SENSITIVE Sensitive     TRIMETH/SULFA >=320 RESISTANT Resistant     AMPICILLIN/SULBACTAM 8 SENSITIVE Sensitive     PIP/TAZO <=4 SENSITIVE Sensitive     Extended ESBL NEGATIVE Sensitive     * >=100,000 COLONIES/mL ESCHERICHIA COLI     Labs: Basic Metabolic Panel:  Recent Labs Lab 06/19/16 1239 06/20/16 0537 06/21/16 0544  NA 132* 132* 136  K 4.1 4.0 4.1  CL 101 101 106  CO2 24 22 23   GLUCOSE 144* 373* 132*  BUN 15 19 24*  CREATININE 0.90 1.10* 1.01*  CALCIUM 9.0 8.4* 8.6*   Liver Function Tests: No results for input(s): AST, ALT, ALKPHOS, BILITOT,  PROT, ALBUMIN in the last 168 hours. No results for input(s): LIPASE, AMYLASE in the last 168 hours. No results for input(s): AMMONIA in the last 168 hours. CBC:  Recent Labs Lab 06/19/16 1239 06/20/16 0537 06/21/16 0544  WBC 6.4 6.3 7.8  HGB 12.7 11.1* 10.7*  HCT 37.8 33.5* 32.6*  MCV 81.1 81.3 82.1  PLT 177 155 164   Cardiac Enzymes: No results for input(s): CKTOTAL, CKMB, CKMBINDEX, TROPONINI in the last 168 hours. BNP: BNP (last 3 results) No results for input(s): BNP in the last 8760 hours.  ProBNP (last 3 results) No results for input(s): PROBNP in the last 8760 hours.  CBG:  Recent Labs Lab 06/20/16 0742 06/20/16 1119 06/20/16 1617 06/20/16 2104 06/21/16 0716  GLUCAP 298* 365* 204* 181* 143*       Signed:  HERNANDEZ  [facility]  [facility] Pager: [phone] 06/21/2016, 9:14 AM

## 2016-06-21 NOTE — Progress Notes (Signed)
Discharged PT per MD order and protocol. Discharge handouts reviewed/explained. Education completed.  Pt verbalized understanding and left with all belongings. VSS. IV catheter D/C. Prescriptions given to pt and explained. Patient wheeled down by staff member.  Lesly Dukes, RN

## 2016-06-21 NOTE — Care Management Important Message (Signed)
Important Message  Patient Details  Name: Tracey Koch MRN: [id number] Date of Birth: 1946/02/18   Medicare Important Message Given:  Yes    Malcolm Metro, RN 06/21/2016, 10:15 AM

## 2016-06-22 ENCOUNTER — Telehealth (HOSPITAL_BASED_OUTPATIENT_CLINIC_OR_DEPARTMENT_OTHER): Payer: Self-pay | Admitting: *Deleted

## 2016-06-22 NOTE — Telephone Encounter (Signed)
Post ED Visit - Positive Culture Follow-up  Culture report reviewed by antimicrobial stewardship pharmacist:  []  , Pharm.D. []  Enzo Bi, Pharm.D., BCPS []  , Pharm.D. []  Celedonio Miyamoto, Pharm.D., BCPS []  Velarde, Garvin Fila.D., BCPS, AAHIVP []  , Pharm.D., BCPS, AAHIVP []  Georgina Pillion, Pharm.D. []  , [place].D. Cassie Kuppelweiser, Pharm D   Positive urine culture Treated with Nitrofurantoin Monohyd Macro, organism sensitive to the same and no further patient follow-up is required at this time.  [address] [facility] 06/22/2016, 10:29 AM

## 2016-11-21 DIAGNOSIS — M069 Rheumatoid arthritis, unspecified: Secondary | ICD-10-CM | POA: Diagnosis not present

## 2016-11-21 DIAGNOSIS — E119 Type 2 diabetes mellitus without complications: Secondary | ICD-10-CM | POA: Diagnosis not present

## 2016-11-21 DIAGNOSIS — Z950 Presence of cardiac pacemaker: Secondary | ICD-10-CM | POA: Diagnosis not present

## 2016-11-21 DIAGNOSIS — J449 Chronic obstructive pulmonary disease, unspecified: Secondary | ICD-10-CM | POA: Diagnosis not present

## 2016-11-21 DIAGNOSIS — Z6831 Body mass index (BMI) 31.0-31.9, adult: Secondary | ICD-10-CM | POA: Diagnosis not present

## 2016-11-21 DIAGNOSIS — E7800 Pure hypercholesterolemia, unspecified: Secondary | ICD-10-CM | POA: Diagnosis not present

## 2016-11-21 DIAGNOSIS — D649 Anemia, unspecified: Secondary | ICD-10-CM | POA: Diagnosis not present

## 2016-11-21 DIAGNOSIS — Z0000 Encounter for general adult medical examination without abnormal findings: Secondary | ICD-10-CM | POA: Diagnosis not present

## 2017-02-22 ENCOUNTER — Ambulatory Visit (INDEPENDENT_AMBULATORY_CARE_PROVIDER_SITE_OTHER): Payer: Medicare Other | Admitting: Internal Medicine

## 2017-02-22 ENCOUNTER — Encounter: Payer: Self-pay | Admitting: Internal Medicine

## 2017-02-22 VITALS — BP 128/78 | HR 88 | Ht 64.0 in | Wt 182.0 lb

## 2017-02-22 DIAGNOSIS — I480 Paroxysmal atrial fibrillation: Secondary | ICD-10-CM

## 2017-02-22 DIAGNOSIS — I495 Sick sinus syndrome: Secondary | ICD-10-CM | POA: Diagnosis not present

## 2017-02-22 DIAGNOSIS — Z79899 Other long term (current) drug therapy: Secondary | ICD-10-CM | POA: Diagnosis not present

## 2017-02-22 DIAGNOSIS — Z950 Presence of cardiac pacemaker: Secondary | ICD-10-CM

## 2017-02-22 MED ORDER — APIXABAN 5 MG PO TABS: 5.0000 mg | ORAL_TABLET | Freq: Two times a day (BID) | ORAL | 11 refills | Status: AC

## 2017-02-22 NOTE — Patient Instructions (Signed)
Medication Instructions:  Your physician has recommended you make the following change in your medication:  Start Eliquis 5 mg Two Times Daily    Labwork: Your physician recommends that you return for lab work in: 1 Month ( 03/26/17)    Testing/Procedures: NONE  Follow-Up: Your physician wants you to follow-up in: 1 with Dr. Ladona Ridgel. You will receive a reminder letter in the mail two months in advance. If you don't receive a letter, please call our office to schedule the follow-up appointment.   Any Other Special Instructions Will Be Listed Below (If Applicable).     If you need a refill on your cardiac medications before your next appointment, please call your pharmacy.  Thank you for choosing [facility]!

## 2017-02-22 NOTE — Progress Notes (Signed)
HPI Tracey Koch returns today for followup. She is a pleasant 71 yo woman with a h/o symptomatic bradycardia, s/p PPM, HTN, arthritis, and DM. In the interim, she has been stable. No chest pain or sob. No syncope. She notes occaisional palpitations. She has some arthritic symptoms. Allergies  Allergen Reactions  . Penicillins Shortness Of Breath, Itching, Swelling and Rash    Has patient had a PCN reaction causing immediate rash, facial/tongue/throat swelling, SOB or lightheadedness with hypotension: Yes Has patient had a PCN reaction causing severe rash involving mucus membranes or skin necrosis: No Has patient had a PCN reaction that required hospitalization No Has patient had a PCN reaction occurring within the last 10 years: No If all of the above answers are "NO", then may proceed with Cephalosporin use.   Marland Kitchen Macrobid [Nitrofurantoin] Hives, Diarrhea, Itching and Nausea And Vomiting  . Codeine Itching, Swelling and Rash  . Levaquin [Levofloxacin In D5w] Itching, Swelling and Other (See Comments)    Can take low dose  . Sulfa Drugs Cross Reactors Itching, Swelling and Rash     Current Outpatient Prescriptions  Medication Sig Dispense Refill  . famotidine (PEPCID) 20 MG tablet Take 1 tablet (20 mg total) by mouth 2 (two) times daily. 6 tablet 0  . HYDROcodone-acetaminophen (NORCO) 7.5-325 MG tablet Take 1 tablet by mouth every 6 (six) hours as needed for moderate pain.     . metFORMIN (GLUCOPHAGE) 500 MG tablet Take 1,000 mg by mouth 2 (two) times daily with a meal.     . Omega-3 Fatty Acids (OMEGA-3 FISH OIL PO) Take 1 capsule by mouth 2 (two) times daily. OMEGA XL    . predniSONE (DELTASONE) 10 MG tablet Take 1 tablet (10 mg total) by mouth daily with breakfast. Take 6 tablets today and then decrease by 1 tablet daily until none are left. 21 tablet 0  . simvastatin (ZOCOR) 20 MG tablet Take 20 mg by mouth every evening.    . solifenacin (VESICARE) 5 MG tablet Take 5 mg by mouth daily.     . traZODone (DESYREL) 150 MG tablet Take 150 mg by mouth at bedtime.     No current facility-administered medications for this visit.      Past Medical History:  Diagnosis Date  . Anemia    takes Folic Acid daily  . Arthritis    takes Plaquenil daily and Methotrexate 2 times a week  . Cough    takes Allegra daily  . Depression    takes Prozac at bedtime  . Diabetes mellitus without complication (HCC)    takes Metformin daily  . Family history of anesthesia complication    sister post op N/V  . History of blood transfusion   . History of UTI   . Hyperlipidemia    takes Simvastatin nightly  . Joint pain   . Joint swelling   . Pacemaker   . Seasonal allergies    takes Allegra daily  . Sinoatrial node dysfunction (HCC)    has ICD    ROS:   All systems reviewed and negative except as noted in the HPI.   Past Surgical History:  Procedure Laterality Date  . ABDOMINAL HYSTERECTOMY    . CHOLECYSTECTOMY    . COLONOSCOPY    . EP IMPLANTABLE DEVICE N/A 08/06/2015   Procedure: PPM Generator Changeout;  Surgeon: Marinus Maw, MD;  Location: [facility] INVASIVE CV LAB;  Service: Cardiovascular;  Laterality: N/A;  . INSERT / REPLACE / REMOVE PACEMAKER    .  right knee replacement    . TOTAL KNEE ARTHROPLASTY Left 05/06/2013   Dr Cleophas Dunker  . TOTAL KNEE ARTHROPLASTY Left 05/06/2013   Procedure: TOTAL KNEE ARTHROPLASTY;  Surgeon: [name], MD;  Location: [facility];  Service: Orthopedics;  Laterality: Left;     Family History  Problem Relation Age of Onset  . COPD Mother   . Stroke Father   . Heart attack Father   . Heart failure Sister   . Diabetes Sister   . CAD Brother   . Diabetes Brother   . Multiple sclerosis Sister   . CAD Brother   . Diabetes Brother   . Renal cancer Brother      Social History   Social History  . Marital status: Married    Spouse name: N/A  . Number of children: N/A  . Years of education: N/A   Occupational History  . Not on file.    Social History Main Topics  . Smoking status: Current Every Day Smoker    Packs/day: 1.00    Years: 52.00    Types: Cigarettes    Start date: 03/01/1962    Last attempt to quit: 02/27/2014  . Smokeless tobacco: Never Used  . Alcohol use No  . Drug use: No  . Sexual activity: Not Currently    Birth control/ protection: Surgical   Other Topics Concern  . Not on file   Social History Narrative  . No narrative on file     BP 128/78   Pulse 88   Ht 5\' 4"  (1.626 m)   Wt 182 lb (82.6 kg)   SpO2 96%   BMI 31.24 kg/m   Physical Exam:  Well appearing 71 yo woman, NAD HEENT: Unremarkable Neck:  7 cm JVD, no thyromegally Lungs:  Clear with no wheezes, rales, or rhonchi HEART:  Regular rate rhythm, no murmurs, no rubs, no clicks Abd:  soft, positive bowel sounds, no organomegally, no rebound, no guarding Ext:  2 plus pulses, no edema, no cyanosis, no clubbing Skin:  No rashes no nodules Neuro:  CN II through XII intact, motor grossly intact  DEVICE  Normal device function.  See PaceArt for details. PAF is noted  Assess/Plan:  1. Sinus node dysfunction - she is s/p PM generator change out 6 months ago and has healed nicely. She is asymptomatic. 2. Dyslipidemia - she will continue her current meds. She is encouraged to maintain a low fat diet. 3. PPM - her medtronic DDD PM is working normally. Will recheck in several months. 4. PAF - I have recommended she start systemic anti-coagulation.    61, M.D.

## 2017-03-06 ENCOUNTER — Telehealth: Payer: Self-pay | Admitting: Internal Medicine

## 2017-03-06 NOTE — Telephone Encounter (Signed)
Patient reported to nurse that she was having some breathing difficulty that started on this past Sunday. Patient said she used her nebulizer and her symptoms resolved. Patient said she got up this morning with SOB but didn't use her nebulizer. Patient denied chest pain, dizziness, n/v, chills, fever, headache, swelling. Patient advised that eliquis doesn't usually cause sob but that the message would be sent to her provider for advice. Patient also instructed to use her nebulizer again since it did help on Sunday and instructed to contact her PCP (Tapper) r/e her sob.

## 2017-03-06 NOTE — Telephone Encounter (Signed)
Per phone call--pt states since starting to take the Eliquis she's started to have SOB

## 2017-03-21 NOTE — Telephone Encounter (Signed)
I called pt for update, she is feeling better.She called her PCP and they called in an antibiotic.

## 2017-03-26 LAB — CUP PACEART INCLINIC DEVICE CHECK
Date Time Interrogation Session: 20180827161604
Implantable Lead Implant Date: 20061110
MDC IDC LEAD IMPLANT DT: 20061110
MDC IDC LEAD LOCATION: 753859
MDC IDC LEAD LOCATION: 753860
MDC IDC PG IMPLANT DT: 20170106

## 2017-03-27 ENCOUNTER — Other Ambulatory Visit (HOSPITAL_COMMUNITY)
Admission: RE | Admit: 2017-03-27 | Discharge: 2017-03-27 | Disposition: A | Discharge status: Home / Self Care | Payer: Medicare Other | Source: Ambulatory Visit | Attending: Internal Medicine | Admitting: Internal Medicine

## 2017-03-27 DIAGNOSIS — Z79899 Other long term (current) drug therapy: Secondary | ICD-10-CM | POA: Diagnosis not present

## 2017-03-27 LAB — BASIC METABOLIC PANEL
ANION GAP: 10 (ref 5–15)
BUN: 16 mg/dL (ref 6–20)
CALCIUM: 9.2 mg/dL (ref 8.9–10.3)
CO2: 25 mmol/L (ref 22–32)
Chloride: 99 mmol/L — ABNORMAL LOW (ref 101–111)
Creatinine, Ser: 0.97 mg/dL (ref 0.44–1.00)
GFR calc non Af Amer: 57 mL/min — ABNORMAL LOW (ref >60)
Glucose, Bld: 200 mg/dL — ABNORMAL HIGH (ref 65–99)
Potassium: 4.2 mmol/L (ref 3.5–5.1)
Sodium: 134 mmol/L — ABNORMAL LOW (ref 135–145)

## 2017-03-27 LAB — CBC WITH DIFFERENTIAL/PLATELET
BASOS PCT: 1 %
Basophils Absolute: 0 10*3/uL (ref 0.0–0.1)
EOS ABS: 0.4 10*3/uL (ref 0.0–0.7)
Eosinophils Relative: 7 %
HCT: 37.7 % (ref 36.0–46.0)
Hemoglobin: 12.5 g/dL (ref 12.0–15.0)
Lymphocytes Relative: 28 %
Lymphs Abs: 1.6 10*3/uL (ref 0.7–4.0)
MCH: 26.8 pg (ref 26.0–34.0)
MCHC: 33.2 g/dL (ref 30.0–36.0)
MCV: 80.7 fL (ref 78.0–100.0)
MONO ABS: 0.5 10*3/uL (ref 0.1–1.0)
MONOS PCT: 8 %
NEUTROS ABS: 3.3 10*3/uL (ref 1.7–7.7)
Neutrophils Relative %: 56 %
Platelets: 174 10*3/uL (ref 150–400)
RBC: 4.67 MIL/uL (ref 3.87–5.11)
RDW: 15.4 % (ref 11.5–15.5)
WBC: 5.8 10*3/uL (ref 4.0–10.5)

## 2017-05-24 ENCOUNTER — Telehealth: Payer: Self-pay | Admitting: Cardiology

## 2017-05-24 ENCOUNTER — Encounter: Payer: Medicare Other | Admitting: *Deleted

## 2017-05-24 NOTE — Telephone Encounter (Signed)
Spoke with pt and reminded pt of remote transmission that is due today. Pt verbalized understanding.   

## 2017-05-25 ENCOUNTER — Encounter: Payer: Self-pay | Admitting: Cardiology

## 2017-06-04 ENCOUNTER — Telehealth: Payer: Self-pay | Admitting: Internal Medicine

## 2017-06-04 NOTE — Telephone Encounter (Signed)
New Message      1. Has your device fired? no  2. Is you device beeping? no  3. Are you experiencing draining or swelling at device site? no  4. Are you calling to see if we received your device transmission?  Device will not transmit  5. Have you passed out? no    Please route to Device Clinic Pool

## 2017-06-04 NOTE — Telephone Encounter (Signed)
Pt using landline adapter with [id number] monitor. I will order a new [id number] monitor to see if she gets cell reception with it, if not she will need to send scheduled transmissions when in town. She verbalizes understanding.

## 2017-06-07 ENCOUNTER — Ambulatory Visit (INDEPENDENT_AMBULATORY_CARE_PROVIDER_SITE_OTHER): Payer: Medicare Other | Admitting: *Deleted

## 2017-06-07 DIAGNOSIS — I495 Sick sinus syndrome: Secondary | ICD-10-CM

## 2017-06-08 ENCOUNTER — Encounter: Payer: Self-pay | Admitting: Cardiology

## 2017-06-08 NOTE — Progress Notes (Signed)
Remote pacemaker transmission.   

## 2017-06-09 DIAGNOSIS — Z23 Encounter for immunization: Secondary | ICD-10-CM | POA: Diagnosis not present

## 2017-06-13 LAB — CUP PACEART REMOTE DEVICE CHECK
Battery Impedance: 136 Ohm
Brady Statistic AP VS Percent: 1 %
Brady Statistic AS VP Percent: 0 %
Brady Statistic AS VS Percent: 99 %
Date Time Interrogation Session: 20181108205109
Implantable Lead Implant Date: 20061110
Implantable Lead Model: 5076
Lead Channel Impedance Value: 671 Ohm
Lead Channel Pacing Threshold Amplitude: 0.5 V
Lead Channel Pacing Threshold Pulse Width: 0.4 ms
Lead Channel Pacing Threshold Pulse Width: 0.4 ms
Lead Channel Setting Pacing Amplitude: 1.5 V
Lead Channel Setting Pacing Amplitude: 2 V
Lead Channel Setting Sensing Sensitivity: 5.6 mV
MDC IDC LEAD IMPLANT DT: 20061110
MDC IDC LEAD LOCATION: 753859
MDC IDC LEAD LOCATION: 753860
MDC IDC MSMT BATTERY REMAINING LONGEVITY: 164 mo
MDC IDC MSMT BATTERY VOLTAGE: 2.8 V
MDC IDC MSMT LEADCHNL RA IMPEDANCE VALUE: 539 Ohm
MDC IDC MSMT LEADCHNL RV PACING THRESHOLD AMPLITUDE: 0.875 V
MDC IDC PG IMPLANT DT: 20170106
MDC IDC SET LEADCHNL RV PACING PULSEWIDTH: 0.4 ms
MDC IDC STAT BRADY AP VP PERCENT: 0 %

## 2017-07-05 DIAGNOSIS — M069 Rheumatoid arthritis, unspecified: Secondary | ICD-10-CM | POA: Diagnosis not present

## 2017-07-05 DIAGNOSIS — E118 Type 2 diabetes mellitus with unspecified complications: Secondary | ICD-10-CM | POA: Diagnosis not present

## 2017-09-06 ENCOUNTER — Ambulatory Visit (INDEPENDENT_AMBULATORY_CARE_PROVIDER_SITE_OTHER): Payer: Medicare Other | Admitting: *Deleted

## 2017-09-06 DIAGNOSIS — I495 Sick sinus syndrome: Secondary | ICD-10-CM

## 2017-09-06 NOTE — Progress Notes (Signed)
Remote pacemaker transmission.   

## 2017-09-11 DIAGNOSIS — R0989 Other specified symptoms and signs involving the circulatory and respiratory systems: Secondary | ICD-10-CM | POA: Diagnosis not present

## 2017-09-11 DIAGNOSIS — R509 Fever, unspecified: Secondary | ICD-10-CM | POA: Diagnosis not present

## 2017-09-11 DIAGNOSIS — R05 Cough: Secondary | ICD-10-CM | POA: Diagnosis not present

## 2017-09-11 DIAGNOSIS — R030 Elevated blood-pressure reading, without diagnosis of hypertension: Secondary | ICD-10-CM | POA: Diagnosis not present

## 2017-09-12 ENCOUNTER — Encounter: Payer: Self-pay | Admitting: Cardiology

## 2017-10-08 LAB — CUP PACEART REMOTE DEVICE CHECK
Battery Remaining Longevity: 164 mo
Brady Statistic AP VP Percent: 0 %
Brady Statistic AP VS Percent: 1 %
Brady Statistic AS VP Percent: 0 %
Brady Statistic AS VS Percent: 99 %
Implantable Lead Implant Date: 20061110
Implantable Lead Location: 753860
Lead Channel Impedance Value: 555 Ohm
Lead Channel Impedance Value: 635 Ohm
Lead Channel Pacing Threshold Amplitude: 0.875 V
Lead Channel Setting Pacing Amplitude: 1.5 V
MDC IDC LEAD IMPLANT DT: 20061110
MDC IDC LEAD LOCATION: 753859
MDC IDC MSMT BATTERY IMPEDANCE: 136 Ohm
MDC IDC MSMT BATTERY VOLTAGE: 2.8 V
MDC IDC MSMT LEADCHNL RA PACING THRESHOLD AMPLITUDE: 0.5 V
MDC IDC MSMT LEADCHNL RA PACING THRESHOLD PULSEWIDTH: 0.4 ms
MDC IDC MSMT LEADCHNL RV PACING THRESHOLD PULSEWIDTH: 0.4 ms
MDC IDC PG IMPLANT DT: 20170106
MDC IDC SESS DTM: 20190207165331
MDC IDC SET LEADCHNL RV PACING AMPLITUDE: 2 V
MDC IDC SET LEADCHNL RV PACING PULSEWIDTH: 0.4 ms
MDC IDC SET LEADCHNL RV SENSING SENSITIVITY: 5.6 mV

## 2017-11-27 DIAGNOSIS — E119 Type 2 diabetes mellitus without complications: Secondary | ICD-10-CM | POA: Diagnosis not present

## 2017-11-27 DIAGNOSIS — J441 Chronic obstructive pulmonary disease with (acute) exacerbation: Secondary | ICD-10-CM | POA: Diagnosis not present

## 2017-11-27 DIAGNOSIS — Z862 Personal history of diseases of the blood and blood-forming organs and certain disorders involving the immune mechanism: Secondary | ICD-10-CM | POA: Diagnosis not present

## 2017-11-27 DIAGNOSIS — N390 Urinary tract infection, site not specified: Secondary | ICD-10-CM | POA: Diagnosis not present

## 2017-11-27 DIAGNOSIS — R8290 Unspecified abnormal findings in urine: Secondary | ICD-10-CM | POA: Diagnosis not present

## 2017-11-27 DIAGNOSIS — Z1159 Encounter for screening for other viral diseases: Secondary | ICD-10-CM | POA: Diagnosis not present

## 2017-11-27 DIAGNOSIS — M069 Rheumatoid arthritis, unspecified: Secondary | ICD-10-CM | POA: Diagnosis not present

## 2017-11-27 DIAGNOSIS — E7800 Pure hypercholesterolemia, unspecified: Secondary | ICD-10-CM | POA: Diagnosis not present

## 2017-11-27 DIAGNOSIS — R319 Hematuria, unspecified: Secondary | ICD-10-CM | POA: Diagnosis not present

## 2017-11-27 DIAGNOSIS — R32 Unspecified urinary incontinence: Secondary | ICD-10-CM | POA: Diagnosis not present

## 2017-11-27 DIAGNOSIS — Z79899 Other long term (current) drug therapy: Secondary | ICD-10-CM | POA: Diagnosis not present

## 2017-12-06 ENCOUNTER — Ambulatory Visit (INDEPENDENT_AMBULATORY_CARE_PROVIDER_SITE_OTHER): Payer: Medicare Other | Admitting: *Deleted

## 2017-12-06 ENCOUNTER — Telehealth: Payer: Self-pay | Admitting: Cardiology

## 2017-12-06 DIAGNOSIS — I495 Sick sinus syndrome: Secondary | ICD-10-CM | POA: Diagnosis not present

## 2017-12-06 NOTE — Telephone Encounter (Signed)
Attempted to confirm remote transmission with pt. No answer and was unable to leave a message. Automated message stating that the call could not be completed as dialed.  

## 2017-12-10 NOTE — Progress Notes (Signed)
Remote pacemaker transmission.   

## 2017-12-11 ENCOUNTER — Encounter: Payer: Self-pay | Admitting: Cardiology

## 2018-01-02 LAB — CUP PACEART REMOTE DEVICE CHECK
Battery Remaining Longevity: 157 mo
Battery Voltage: 2.8 V
Brady Statistic AP VP Percent: 0 %
Brady Statistic AP VS Percent: 1 %
Brady Statistic AS VP Percent: 0 %
Implantable Lead Implant Date: 20061110
Implantable Lead Location: 753859
Implantable Lead Location: 753860
Implantable Lead Model: 5076
Implantable Lead Model: 5076
Implantable Pulse Generator Implant Date: 20170106
Lead Channel Impedance Value: 574 Ohm
Lead Channel Pacing Threshold Amplitude: 0.5 V
Lead Channel Pacing Threshold Amplitude: 0.875 V
Lead Channel Pacing Threshold Pulse Width: 0.4 ms
Lead Channel Pacing Threshold Pulse Width: 0.4 ms
Lead Channel Setting Pacing Pulse Width: 0.4 ms
MDC IDC LEAD IMPLANT DT: 20061110
MDC IDC MSMT BATTERY IMPEDANCE: 160 Ohm
MDC IDC MSMT LEADCHNL RV IMPEDANCE VALUE: 654 Ohm
MDC IDC SESS DTM: 20190510142513
MDC IDC SET LEADCHNL RA PACING AMPLITUDE: 1.5 V
MDC IDC SET LEADCHNL RV PACING AMPLITUDE: 2 V
MDC IDC SET LEADCHNL RV SENSING SENSITIVITY: 5.6 mV
MDC IDC STAT BRADY AS VS PERCENT: 99 %

## 2018-02-26 ENCOUNTER — Encounter (HOSPITAL_COMMUNITY): Payer: Self-pay | Admitting: Emergency Medicine

## 2018-02-26 ENCOUNTER — Other Ambulatory Visit: Payer: Self-pay

## 2018-02-26 ENCOUNTER — Emergency Department (HOSPITAL_COMMUNITY): Payer: Medicare Other

## 2018-02-26 ENCOUNTER — Emergency Department (HOSPITAL_COMMUNITY)
Admission: EM | Admit: 2018-02-26 | Discharge: 2018-02-26 | Disposition: A | Discharge status: Home / Self Care | Payer: Medicare Other | Attending: Emergency Medicine | Admitting: Emergency Medicine

## 2018-02-26 DIAGNOSIS — F329 Major depressive disorder, single episode, unspecified: Secondary | ICD-10-CM | POA: Diagnosis not present

## 2018-02-26 DIAGNOSIS — F17210 Nicotine dependence, cigarettes, uncomplicated: Secondary | ICD-10-CM | POA: Insufficient documentation

## 2018-02-26 DIAGNOSIS — Z7984 Long term (current) use of oral hypoglycemic drugs: Secondary | ICD-10-CM | POA: Diagnosis not present

## 2018-02-26 DIAGNOSIS — Z7901 Long term (current) use of anticoagulants: Secondary | ICD-10-CM | POA: Insufficient documentation

## 2018-02-26 DIAGNOSIS — Z950 Presence of cardiac pacemaker: Secondary | ICD-10-CM | POA: Insufficient documentation

## 2018-02-26 DIAGNOSIS — R1030 Lower abdominal pain, unspecified: Secondary | ICD-10-CM | POA: Diagnosis not present

## 2018-02-26 DIAGNOSIS — G894 Chronic pain syndrome: Secondary | ICD-10-CM | POA: Diagnosis not present

## 2018-02-26 DIAGNOSIS — R161 Splenomegaly, not elsewhere classified: Secondary | ICD-10-CM | POA: Diagnosis not present

## 2018-02-26 DIAGNOSIS — E119 Type 2 diabetes mellitus without complications: Secondary | ICD-10-CM | POA: Diagnosis not present

## 2018-02-26 DIAGNOSIS — Z79899 Other long term (current) drug therapy: Secondary | ICD-10-CM | POA: Insufficient documentation

## 2018-02-26 DIAGNOSIS — N390 Urinary tract infection, site not specified: Secondary | ICD-10-CM | POA: Diagnosis not present

## 2018-02-26 DIAGNOSIS — Z96652 Presence of left artificial knee joint: Secondary | ICD-10-CM | POA: Diagnosis not present

## 2018-02-26 DIAGNOSIS — I10 Essential (primary) hypertension: Secondary | ICD-10-CM | POA: Insufficient documentation

## 2018-02-26 DIAGNOSIS — K5730 Diverticulosis of large intestine without perforation or abscess without bleeding: Secondary | ICD-10-CM | POA: Diagnosis not present

## 2018-02-26 DIAGNOSIS — Z9049 Acquired absence of other specified parts of digestive tract: Secondary | ICD-10-CM | POA: Diagnosis not present

## 2018-02-26 DIAGNOSIS — M549 Dorsalgia, unspecified: Secondary | ICD-10-CM | POA: Diagnosis not present

## 2018-02-26 DIAGNOSIS — R8290 Unspecified abnormal findings in urine: Secondary | ICD-10-CM | POA: Diagnosis not present

## 2018-02-26 LAB — CBC WITH DIFFERENTIAL/PLATELET
BASOS PCT: 0 %
Basophils Absolute: 0 10*3/uL (ref 0.0–0.1)
Eosinophils Absolute: 0.3 10*3/uL (ref 0.0–0.7)
Eosinophils Relative: 6 %
HCT: 36.7 % (ref 36.0–46.0)
HEMOGLOBIN: 12.2 g/dL (ref 12.0–15.0)
Lymphocytes Relative: 32 %
Lymphs Abs: 1.5 10*3/uL (ref 0.7–4.0)
MCH: 28 pg (ref 26.0–34.0)
MCHC: 33.2 g/dL (ref 30.0–36.0)
MCV: 84.2 fL (ref 78.0–100.0)
Monocytes Absolute: 0.3 10*3/uL (ref 0.1–1.0)
Monocytes Relative: 6 %
NEUTROS PCT: 56 %
Neutro Abs: 2.7 10*3/uL (ref 1.7–7.7)
Platelets: 106 10*3/uL — ABNORMAL LOW (ref 150–400)
RBC: 4.36 MIL/uL (ref 3.87–5.11)
RDW: 14.5 % (ref 11.5–15.5)
WBC: 4.7 10*3/uL (ref 4.0–10.5)

## 2018-02-26 LAB — URINALYSIS, ROUTINE W REFLEX MICROSCOPIC
Bilirubin Urine: NEGATIVE
Glucose, UA: NEGATIVE mg/dL
KETONES UR: NEGATIVE mg/dL
Nitrite: POSITIVE — AB
PROTEIN: NEGATIVE mg/dL
Specific Gravity, Urine: 1.011 (ref 1.005–1.030)
pH: 7 (ref 5.0–8.0)

## 2018-02-26 LAB — BASIC METABOLIC PANEL
Anion gap: 11 (ref 5–15)
BUN: 14 mg/dL (ref 8–23)
CHLORIDE: 102 mmol/L (ref 98–111)
CO2: 24 mmol/L (ref 22–32)
CREATININE: 0.89 mg/dL (ref 0.44–1.00)
Calcium: 9 mg/dL (ref 8.9–10.3)
GFR calc non Af Amer: >60 mL/min (ref >60)
Glucose, Bld: 221 mg/dL — ABNORMAL HIGH (ref 70–99)
Potassium: 4 mmol/L (ref 3.5–5.1)
SODIUM: 137 mmol/L (ref 135–145)

## 2018-02-26 MED ORDER — FOSFOMYCIN TROMETHAMINE 3 G PO PACK
3.0000 g | PACK | Freq: Once | ORAL | Status: AC
  Administered 2018-02-26: 3 g via ORAL
  Filled 2018-02-26 (×2): qty 3

## 2018-02-26 MED ORDER — IOPAMIDOL (ISOVUE-300) INJECTION 61%
100.0000 mL | Freq: Once | INTRAVENOUS | Status: AC | PRN
  Administered 2018-02-26: 100 mL via INTRAVENOUS

## 2018-02-26 NOTE — Discharge Instructions (Addendum)
Urine culture is been sent.  You have been given a dose of fosfomycin here.  Have your doctor follow-up the culture.  Also your spleen was enlarged and your liver showed what could be some early cirrhosis.  Have your doctor also follow-up on this.

## 2018-02-26 NOTE — ED Triage Notes (Signed)
Patient complaining of bilateral flank pain x 2 weeks. States she was treated for E-coli infection in urine in May and was supposed to follow up to ensure infection was resolved but did not follow up. Also complaining of burning with urination.

## 2018-02-26 NOTE — ED Notes (Signed)
Primary reminded pt requesting pain meds.

## 2018-02-26 NOTE — ED Provider Notes (Signed)
[facility] EMERGENCY DEPARTMENT Provider Note   CSN: [id number] Arrival date & time: 02/26/18  1150     History   Chief Complaint Chief Complaint  Patient presents with  . Flank Pain    HPI Tracey Koch is a 72 y.o. female.  HPI Patient presents with lower abdominal and flank pain for the last week or 2.  Seen at PCPs office and sent in for potentially kidney infection.  States she had a fever yesterday.  Did not check temperature but states it was up.  Some urinary frequency.  Had urinary tract infection in May with E. coli.  Has culture report with her.  Has had pain in both her flanks now.  States she feels weak.  No vomiting.  No diarrhea.  Patient states she and her family members have had cysts on her kidneys.  States she has a cyst on her left side.  States that she was told this years ago but has not followed up with anything for it. Past Medical History:  Diagnosis Date  . Anemia    takes Folic Acid daily  . Arthritis    takes Plaquenil daily and Methotrexate 2 times a week  . Cough    takes Allegra daily  . Depression    takes Prozac at bedtime  . Diabetes mellitus without complication (HCC)    takes Metformin daily  . Family history of anesthesia complication    sister post op N/V  . History of blood transfusion   . History of UTI   . Hyperlipidemia    takes Simvastatin nightly  . Joint pain   . Joint swelling   . Pacemaker   . Seasonal allergies    takes Allegra daily  . Sinoatrial node dysfunction (HCC)    has ICD    Patient Active Problem List   Diagnosis Date Noted  . Paroxysmal atrial fibrillation (HCC) 02/22/2017  . Normocytic anemia 06/20/2016  . Hyponatremia 06/20/2016  . Allergic reaction 06/20/2016  . Allergic reaction caused by a drug 06/19/2016  . Tobacco abuse 09/18/2014  . Sepsis (HCC) 03/13/2014  . UTI (urinary tract infection) 03/13/2014  . Type 2 diabetes mellitus without complication (HCC) 03/13/2014  . Osteoarthritis of left  knee 05/08/2013  . Sinoatrial node dysfunction (HCC)   . Essential hypertension 02/02/2009  . BRADYCARDIA 02/02/2009  . ARTHRITIS, RHEUMATOID 02/02/2009  . PACEMAKER, PERMANENT 02/02/2009    Past Surgical History:  Procedure Laterality Date  . ABDOMINAL HYSTERECTOMY    . CHOLECYSTECTOMY    . COLONOSCOPY    . EP IMPLANTABLE DEVICE N/A 08/06/2015   Procedure: PPM Generator Changeout;  Surgeon: Marinus Maw, MD;  Location: [facility] INVASIVE CV LAB;  Service: Cardiovascular;  Laterality: N/A;  . INSERT / REPLACE / REMOVE PACEMAKER    . right knee replacement    . TOTAL KNEE ARTHROPLASTY Left 05/06/2013   Dr Cleophas Dunker  . TOTAL KNEE ARTHROPLASTY Left 05/06/2013   Procedure: TOTAL KNEE ARTHROPLASTY;  Surgeon: [name], MD;  Location: [facility] OR;  Service: Orthopedics;  Laterality: Left;     OB History   None      Home Medications    Prior to Admission medications   Medication Sig Start Date End Date Taking? Authorizing Provider  apixaban (ELIQUIS) 5 MG TABS tablet Take 1 tablet (5 mg total) by mouth 2 (two) times daily. 02/22/17   Marinus Maw, MD  famotidine (PEPCID) 20 MG tablet Take 1 tablet (20 mg total) by mouth 2 (  two) times daily. 06/21/16   Philip Aspen, Limmie Patricia, MD  HYDROcodone-acetaminophen (NORCO) 7.5-325 MG tablet Take 1 tablet by mouth every 6 (six) hours as needed for moderate pain.  06/14/16   [provider]  metFORMIN (GLUCOPHAGE) 500 MG tablet Take 1,000 mg by mouth 2 (two) times daily with a meal.  07/09/12   [provider]  Omega-3 Fatty Acids (OMEGA-3 FISH OIL PO) Take 1 capsule by mouth 2 (two) times daily. OMEGA XL    [provider]  predniSONE (DELTASONE) 10 MG tablet Take 1 tablet (10 mg total) by mouth daily with breakfast. Take 6 tablets today and then decrease by 1 tablet daily until none are left. 06/21/16   Philip Aspen, Limmie Patricia, MD  simvastatin (ZOCOR) 20 MG tablet Take 20 mg by mouth every evening.    [provider]  solifenacin (VESICARE) 5 MG tablet Take 5 mg by mouth daily.    [provider]  traZODone (DESYREL) 150 MG tablet Take 150 mg by mouth at bedtime.    [provider]    Family History Family History  Problem Relation Age of Onset  . COPD Mother   . Stroke Father   . Heart attack Father   . Heart failure Sister   . Diabetes Sister   . CAD Brother   . Diabetes Brother   . Multiple sclerosis Sister   . CAD Brother   . Diabetes Brother   . Renal cancer Brother     Social History Social History   Tobacco Use  . Smoking status: Current Every Day Smoker    Packs/day: 1.00    Years: 52.00    Pack years: 52.00    Types: Cigarettes    Start date: 03/01/1962    Last attempt to quit: 02/27/2014    Years since quitting: 4.0  . Smokeless tobacco: Never Used  Substance Use Topics  . Alcohol use: No    Alcohol/week: 0.0 oz  . Drug use: No     Allergies   Penicillins; Macrobid [nitrofurantoin]; Codeine; Levaquin [levofloxacin in d5w]; and Sulfa drugs cross reactors   Review of Systems Review of Systems  Constitutional: Positive for appetite change.  HENT: Negative for congestion.   Respiratory: Negative for shortness of breath.   Gastrointestinal: Positive for abdominal pain.  Genitourinary: Positive for flank pain.  Musculoskeletal: Negative for back pain.  Skin: Negative for rash.  Hematological: Negative for adenopathy.  Psychiatric/Behavioral: Negative for confusion.     Physical Exam Updated Vital Signs BP (!) 187/85 Comment: pt will take blood pressure medication once at home  Pulse 69   Temp 98 F (36.7 C) (Oral)   Resp 18   Ht 5\' 5"  (1.651 m)   Wt 78.5 kg (173 lb)   SpO2 100%   BMI 28.79 kg/m   Physical Exam  Constitutional: She appears well-developed.  HENT:  Head: Normocephalic.  Eyes: Pupils are equal, round, and reactive to light.  Cardiovascular: Normal rate.  Pulmonary/Chest: She has no wheezes.  Abdominal:  There is tenderness.  Suprapubic tenderness.  Genitourinary:  Genitourinary Comments: CVA tenderness on left.  Neurological: She is alert.  Skin: Capillary refill takes less than 2 seconds.     ED Treatments / Results  Labs (all labs ordered are listed, but only abnormal results are displayed) Labs Reviewed  URINALYSIS, ROUTINE W REFLEX MICROSCOPIC - Abnormal; Notable for the following components:      Result Value   Hgb urine dipstick SMALL (*)  Nitrite POSITIVE (*)    Leukocytes, UA SMALL (*)    Bacteria, UA FEW (*)    All other components within normal limits  CBC WITH DIFFERENTIAL/PLATELET - Abnormal; Notable for the following components:   Platelets 106 (*)    All other components within normal limits  BASIC METABOLIC PANEL - Abnormal; Notable for the following components:   Glucose, Bld 221 (*)    All other components within normal limits  URINE CULTURE    EKG None  Radiology Ct Abdomen Pelvis W Contrast  Result Date: 02/26/2018 CLINICAL DATA:  72 y/o  F; 2 weeks of bilateral flank pain. EXAM: CT ABDOMEN AND PELVIS WITH CONTRAST TECHNIQUE: Multidetector CT imaging of the abdomen and pelvis was performed using the standard protocol following bolus administration of intravenous contrast. CONTRAST:  ISOVUE-300 IOPAMIDOL (ISOVUE-300) INJECTION 61% COMPARISON:  11/15/2009 CT abdomen and pelvis FINDINGS: Lower chest: Coronary artery calcific atherosclerosis. Pacemaker leads partially visualized within the right atrium and ventricle. Hepatobiliary: No focal liver abnormality is seen. Status post cholecystectomy. No biliary dilatation. Mild lobulation of liver contour may represent cirrhosis. Pancreas: Unremarkable. No pancreatic ductal dilatation or surrounding inflammatory changes. Spleen: The spleen measures 15.5 x 9.7 x 11.8 cm (volume = 930 cm^3) Adrenals/Urinary Tract: Normal adrenal glands. Multiple kidney cysts measuring up to 6.2 cm in the upper pole of left kidney.  Hydronephrosis. Normal bladder. Stomach/Bowel: Stomach is within normal limits. Appendix not identified, no pericecal inflammation. No evidence of bowel wall thickening, distention, or inflammatory changes. Mild sigmoid diverticulosis, no findings of acute diverticulitis. Vascular/Lymphatic: Aortic atherosclerosis. No enlarged abdominal or pelvic lymph nodes. Reproductive: Status post hysterectomy. Enlarged gonadal veins bilaterally possibly representing portal caval anastomoses or related to pelvic congestion. Other: Trace ascites.  No abdominal wall hernia. Musculoskeletal: No fracture is seen. IMPRESSION: 1. Splenomegaly, approximately 930 cc. 2. Mild lobulation of liver contour suggests cirrhosis. Trace ascites. 3. Enlarged gonadal veins which may represent portal caval anastomoses or pelvic congestion syndrome. 4. Aortic and coronary artery atherosclerosis. 5. Mild sigmoid diverticulosis, no findings of acute diverticulitis. Electronically Signed   By: Mitzi Hansen M.D.   On: 02/26/2018 18:48    Procedures Procedures (including critical care time)  Medications Ordered in ED Medications  iopamidol (ISOVUE-300) 61 % injection 100 mL (100 mLs Intravenous Contrast Given 02/26/18 1802)  fosfomycin (MONUROL) packet 3 g (3 g Oral Given 02/26/18 2023)     Initial Impression / Assessment and Plan / ED Course  I have reviewed the triage vital signs and the nursing notes.  Pertinent labs & imaging results that were available during my care of the patient were reviewed by me and considered in my medical decision making (see chart for details).     Patient with lower abdominal and back pain.  Normal white count.  Urine nonspecific for infection.  Culture is been sent.  Has had previous UTIs.  No fever.  CT scan done showed some possible cirrhosis and splenomegaly.  Does have renal cysts on the left side.  No diverticulitis.  Discussed with patient and given a single dose of fosfomycin.  Culture  of urine sensing be followed by PCP.  PCP can follow outpatient work-up of further abnormalities.  Discharge home  Final Clinical Impressions(s) / ED Diagnoses   Final diagnoses:  Lower urinary tract infectious disease  Splenomegaly    ED Discharge Orders    None       Benjiman Core, MD 02/26/18 2028

## 2018-02-26 NOTE — ED Notes (Signed)
Patient transported to CT 

## 2018-02-26 NOTE — ED Notes (Signed)
ED Provider at bedside. 

## 2018-03-01 LAB — URINE CULTURE

## 2018-03-02 ENCOUNTER — Telehealth: Payer: Self-pay | Admitting: Emergency Medicine

## 2018-03-02 NOTE — Telephone Encounter (Signed)
Post ED Visit - Positive Culture Follow-up  Culture report reviewed by antimicrobial stewardship pharmacist:  []  , Pharm.D. []  Enzo Bi, Pharm.D., BCPS AQ-ID []  , Pharm.D., BCPS []  Celedonio Miyamoto, Pharm.D., BCPS []  [place], Garvin Fila.D., BCPS, AAHIVP []  , Pharm.D., BCPS, AAHIVP []  Georgina Pillion, PharmD, BCPS []  , PharmD, BCPS [x]  [place], PharmD, BCPS []  [place], PharmD  Positive urine culture Treated with fosfomycin, organism sensitive to the same and no further patient follow-up is required at this time.  03/02/2018, 9:56 AM

## 2018-03-07 ENCOUNTER — Ambulatory Visit (INDEPENDENT_AMBULATORY_CARE_PROVIDER_SITE_OTHER): Payer: Medicare Other | Admitting: *Deleted

## 2018-03-07 DIAGNOSIS — I495 Sick sinus syndrome: Secondary | ICD-10-CM

## 2018-03-08 NOTE — Progress Notes (Signed)
Remote pacemaker transmission.   

## 2018-03-12 DIAGNOSIS — R8290 Unspecified abnormal findings in urine: Secondary | ICD-10-CM | POA: Diagnosis not present

## 2018-03-21 DIAGNOSIS — N3000 Acute cystitis without hematuria: Secondary | ICD-10-CM | POA: Diagnosis not present

## 2018-03-21 DIAGNOSIS — N3080 Other cystitis without hematuria: Secondary | ICD-10-CM | POA: Diagnosis not present

## 2018-03-22 DIAGNOSIS — N3080 Other cystitis without hematuria: Secondary | ICD-10-CM | POA: Diagnosis not present

## 2018-03-27 LAB — CUP PACEART REMOTE DEVICE CHECK
Battery Remaining Longevity: 157 mo
Date Time Interrogation Session: 20190808204510
Implantable Lead Implant Date: 20061110
Implantable Lead Location: 753860
Implantable Pulse Generator Implant Date: 20170106
Lead Channel Impedance Value: 531 Ohm
Lead Channel Pacing Threshold Amplitude: 0.875 V
Lead Channel Pacing Threshold Pulse Width: 0.4 ms
Lead Channel Setting Pacing Amplitude: 1.5 V
Lead Channel Setting Pacing Pulse Width: 0.4 ms
MDC IDC LEAD IMPLANT DT: 20061110
MDC IDC LEAD LOCATION: 753859
MDC IDC MSMT BATTERY IMPEDANCE: 160 Ohm
MDC IDC MSMT BATTERY VOLTAGE: 2.8 V
MDC IDC MSMT LEADCHNL RA PACING THRESHOLD AMPLITUDE: 0.5 V
MDC IDC MSMT LEADCHNL RA PACING THRESHOLD PULSEWIDTH: 0.4 ms
MDC IDC MSMT LEADCHNL RV IMPEDANCE VALUE: 689 Ohm
MDC IDC SET LEADCHNL RV PACING AMPLITUDE: 2 V
MDC IDC SET LEADCHNL RV SENSING SENSITIVITY: 5.6 mV
MDC IDC STAT BRADY AP VP PERCENT: 0 %
MDC IDC STAT BRADY AP VS PERCENT: 1 %
MDC IDC STAT BRADY AS VP PERCENT: 0 %
MDC IDC STAT BRADY AS VS PERCENT: 99 %

## 2018-04-04 ENCOUNTER — Ambulatory Visit (INDEPENDENT_AMBULATORY_CARE_PROVIDER_SITE_OTHER): Payer: Medicare Other | Admitting: Internal Medicine

## 2018-04-04 ENCOUNTER — Encounter: Payer: Self-pay | Admitting: Internal Medicine

## 2018-04-04 VITALS — BP 158/88 | HR 93 | Ht 63.5 in | Wt 172.6 lb

## 2018-04-04 DIAGNOSIS — I495 Sick sinus syndrome: Secondary | ICD-10-CM

## 2018-04-04 DIAGNOSIS — Z79899 Other long term (current) drug therapy: Secondary | ICD-10-CM

## 2018-04-04 DIAGNOSIS — I480 Paroxysmal atrial fibrillation: Secondary | ICD-10-CM | POA: Diagnosis not present

## 2018-04-04 MED ORDER — FUROSEMIDE 20 MG PO TABS: 20.0000 mg | ORAL_TABLET | Freq: Every day | ORAL | 3 refills | Status: DC

## 2018-04-04 MED ORDER — LOSARTAN POTASSIUM 50 MG PO TABS: 50.0000 mg | ORAL_TABLET | Freq: Every day | ORAL | 3 refills | Status: DC

## 2018-04-04 NOTE — Patient Instructions (Addendum)
Medication Instructions:  Your physician recommends that you continue on your current medications as directed. Please refer to the Current Medication list given to you today.  Losartan 50 mg Daily  Lasix 20 mg Daily   Labwork: Your physician recommends that you return for lab work in: 1-2 Weeks (04-15-18)    Testing/Procedures: NONE   Follow-Up: Your physician wants you to follow-up in: 1 Year with Dr. Ladona Ridgel. You will receive a reminder letter in the mail two months in advance. If you don't receive a letter, please call our office to schedule the follow-up appointment.  Remote monitoring is used to monitor your Pacemaker of ICD from home. This monitoring reduces the number of office visits required to check your device to one time per year. It allows [place] to keep an eye on the functioning of your device to ensure it is working properly. You are scheduled for a device check from home on 06/06/18. You may send your transmission at any time that day. If you have a wireless device, the transmission will be sent automatically. After your physician reviews your transmission, you will receive a postcard with your next transmission date.    Any Other Special Instructions Will Be Listed Below (If Applicable).     If you need a refill on your cardiac medications before your next appointment, please call your pharmacy.  Thank you for choosing [facility]!

## 2018-04-04 NOTE — Progress Notes (Signed)
HPI Tracey Koch returns today for followup. She is a pleasant 72 yo woman with a h/o symptomatic bradycardia, s/p PPM, HTN, arthritis, and DM. In the interim, she has been stable. No chest pain or sob. No syncope. She notes occaisional palpitations. She has some arthritic symptoms. She notes that her BP has been on the high side this summer. She notes some peripheral edema. Allergies  Allergen Reactions  . Penicillins Shortness Of Breath, Itching, Swelling and Rash    Has patient had a PCN reaction causing immediate rash, facial/tongue/throat swelling, SOB or lightheadedness with hypotension: Yes Has patient had a PCN reaction causing severe rash involving mucus membranes or skin necrosis: No Has patient had a PCN reaction that required hospitalization No Has patient had a PCN reaction occurring within the last 10 years: No If all of the above answers are "NO", then may proceed with Cephalosporin use.   Marland Kitchen Macrobid [Nitrofurantoin] Hives, Diarrhea, Itching and Nausea And Vomiting  . Codeine Itching, Swelling and Rash  . Levaquin [Levofloxacin In D5w] Itching, Swelling and Other (See Comments)    Can take low dose  . Sulfa Drugs Cross Reactors Itching, Swelling and Rash     Current Outpatient Medications  Medication Sig Dispense Refill  . apixaban (ELIQUIS) 5 MG TABS tablet Take 1 tablet (5 mg total) by mouth 2 (two) times daily. 60 tablet 11  . cephALEXin (KEFLEX) 500 MG capsule     . HYDROcodone-acetaminophen (NORCO) 7.5-325 MG tablet Take 1 tablet by mouth every 6 (six) hours as needed for moderate pain.     Marland Kitchen losartan (COZAAR) 25 MG tablet Take by mouth.    . metFORMIN (GLUCOPHAGE) 500 MG tablet Take 1,000 mg by mouth 2 (two) times daily with a meal.     . Omega-3 Fatty Acids (OMEGA-3 FISH OIL PO) Take 1 capsule by mouth 2 (two) times daily. OMEGA XL    . simvastatin (ZOCOR) 20 MG tablet Take 20 mg by mouth every evening.    . solifenacin (VESICARE) 5 MG tablet Take 5 mg by  mouth daily.    . traZODone (DESYREL) 150 MG tablet Take 150 mg by mouth at bedtime.     No current facility-administered medications for this visit.      Past Medical History:  Diagnosis Date  . Anemia    takes Folic Acid daily  . Arthritis    takes Plaquenil daily and Methotrexate 2 times a week  . Cough    takes Allegra daily  . Depression    takes Prozac at bedtime  . Diabetes mellitus without complication (HCC)    takes Metformin daily  . Family history of anesthesia complication    sister post op N/V  . History of blood transfusion   . History of UTI   . Hyperlipidemia    takes Simvastatin nightly  . Joint pain   . Joint swelling   . Pacemaker   . Seasonal allergies    takes Allegra daily  . Sinoatrial node dysfunction (HCC)    has ICD    ROS:   All systems reviewed and negative except as noted in the HPI.   Past Surgical History:  Procedure Laterality Date  . ABDOMINAL HYSTERECTOMY    . CHOLECYSTECTOMY    . COLONOSCOPY    . EP IMPLANTABLE DEVICE N/A 08/06/2015   Procedure: PPM Generator Changeout;  Surgeon: Marinus Maw, MD;  Location: [facility] INVASIVE CV LAB;  Service: Cardiovascular;  Laterality: N/A;  .  INSERT / REPLACE / REMOVE PACEMAKER    . right knee replacement    . TOTAL KNEE ARTHROPLASTY Left 05/06/2013   Dr Cleophas Dunker  . TOTAL KNEE ARTHROPLASTY Left 05/06/2013   Procedure: TOTAL KNEE ARTHROPLASTY;  Surgeon: [name], MD;  Location: [facility] OR;  Service: Orthopedics;  Laterality: Left;     Family History  Problem Relation Age of Onset  . COPD Mother   . Stroke Father   . Heart attack Father   . Heart failure Sister   . Diabetes Sister   . CAD Brother   . Diabetes Brother   . Multiple sclerosis Sister   . CAD Brother   . Diabetes Brother   . Renal cancer Brother      Social History   Socioeconomic History  . Marital status: Married    Spouse name: Not on file  . Number of children: Not on file  . Years of education: Not on file   . Highest education level: Not on file  Occupational History  . Not on file  Social Needs  . Financial resource strain: Not on file  . Food insecurity:    Worry: Not on file    Inability: Not on file  . Transportation needs:    Medical: Not on file    Non-medical: Not on file  Tobacco Use  . Smoking status: Current Every Day Smoker    Packs/day: 1.00    Years: 52.00    Pack years: 52.00    Types: Cigarettes    Start date: 03/01/1962    Last attempt to quit: 02/27/2014    Years since quitting: 4.1  . Smokeless tobacco: Never Used  Substance and Sexual Activity  . Alcohol use: No    Alcohol/week: 0.0 standard drinks  . Drug use: No  . Sexual activity: Not Currently    Birth control/protection: Surgical  Lifestyle  . Physical activity:    Days per week: Not on file    Minutes per session: Not on file  . Stress: Not on file  Relationships  . Social connections:    Talks on phone: Not on file    Gets together: Not on file    Attends religious service: Not on file    Active member of club or organization: Not on file    Attends meetings of clubs or organizations: Not on file    Relationship status: Not on file  . Intimate partner violence:    Fear of current or ex partner: Not on file    Emotionally abused: Not on file    Physically abused: Not on file    Forced sexual activity: Not on file  Other Topics Concern  . Not on file  Social History Narrative  . Not on file     BP (!) 158/88   Pulse 93   Ht 5' 3.5" (1.613 m)   Wt 172 lb 9.6 oz (78.3 kg)   SpO2 91%   BMI 30.10 kg/m   Physical Exam:  Well appearing NAD HEENT: Unremarkable Neck:  No JVD, no thyromegally Lymphatics:  No adenopathy Back:  No CVA tenderness Lungs:  Clear with no wheezes HEART:  Regular rate rhythm, no murmurs, no rubs, no clicks Abd:  soft, positive bowel sounds, no organomegally, no rebound, no guarding Ext:  2 plus pulses, 1-2+ edema, no cyanosis, no clubbing Skin:  No rashes no  nodules Neuro:  CN II through XII intact, motor grossly intact  EKG - NSR  DEVICE  Normal device function.  See PaceArt for details.   Assess/Plan: 1. PAF - her symptoms are controlled. She will continue her current meds. 2. HTN - her blood pressure is not well controlled. I have asked her to increase her losartan and start lasix 20 mg daily. 3. PPM - her medtronic DDD PM is working normally. We will recheck in several months.  4. Peripheral edema - I have asked her to avoid salty food and take a little lasix. We will check a bmp.  Tracey Koch.D.

## 2018-04-17 DIAGNOSIS — Z79899 Other long term (current) drug therapy: Secondary | ICD-10-CM | POA: Diagnosis not present

## 2018-04-17 LAB — BASIC METABOLIC PANEL
BUN / CREAT RATIO: 16 (calc) (ref 6–22)
BUN: 17 mg/dL (ref 7–25)
CHLORIDE: 100 mmol/L (ref 98–110)
CO2: 26 mmol/L (ref 20–32)
CREATININE: 1.06 mg/dL — AB (ref 0.60–0.93)
Calcium: 9.4 mg/dL (ref 8.6–10.4)
Glucose, Bld: 220 mg/dL — ABNORMAL HIGH (ref 65–99)
Potassium: 4.3 mmol/L (ref 3.5–5.3)
SODIUM: 135 mmol/L (ref 135–146)

## 2018-04-23 ENCOUNTER — Telehealth: Payer: Self-pay | Admitting: Internal Medicine

## 2018-04-23 NOTE — Telephone Encounter (Signed)
-----   Message from Marinus Maw, MD sent at 04/22/2018 11:17 PM EDT ----- No change. GT

## 2018-04-23 NOTE — Telephone Encounter (Signed)
Pt returned your call-- was transferred to [place] office once she called [address]

## 2018-04-23 NOTE — Telephone Encounter (Signed)
Informed patient of lab results and verbal understanding expressed.

## 2018-05-13 LAB — CUP PACEART INCLINIC DEVICE CHECK
Battery Remaining Longevity: 156 mo
Brady Statistic AP VS Percent: 0.9 %
Brady Statistic AS VS Percent: 99 %
Implantable Lead Implant Date: 20061110
Implantable Lead Location: 753860
Implantable Lead Model: 5076
Implantable Lead Model: 5076
Lead Channel Impedance Value: 11.2 Ohm
Lead Channel Impedance Value: 574 Ohm
Lead Channel Pacing Threshold Amplitude: 0.75 V
Lead Channel Pacing Threshold Pulse Width: 0.4 ms
Lead Channel Pacing Threshold Pulse Width: 0.4 ms
MDC IDC LEAD IMPLANT DT: 20061110
MDC IDC LEAD LOCATION: 753859
MDC IDC MSMT BATTERY IMPEDANCE: 160 Ohm
MDC IDC MSMT BATTERY VOLTAGE: 2.8 V
MDC IDC MSMT LEADCHNL RA PACING THRESHOLD AMPLITUDE: 0.5 V
MDC IDC MSMT LEADCHNL RA SENSING INTR AMPL: 2 mV
MDC IDC MSMT LEADCHNL RV SENSING INTR AMPL: 22.4 mV
MDC IDC PG IMPLANT DT: 20170106
MDC IDC SESS DTM: 20191014145704
MDC IDC STAT BRADY AP VP PERCENT: 0.1 % — AB
MDC IDC STAT BRADY AS VP PERCENT: 0.1 % — AB

## 2018-05-29 DIAGNOSIS — N390 Urinary tract infection, site not specified: Secondary | ICD-10-CM | POA: Diagnosis not present

## 2018-05-29 DIAGNOSIS — M069 Rheumatoid arthritis, unspecified: Secondary | ICD-10-CM | POA: Diagnosis not present

## 2018-05-29 DIAGNOSIS — G894 Chronic pain syndrome: Secondary | ICD-10-CM | POA: Diagnosis not present

## 2018-05-29 DIAGNOSIS — J00 Acute nasopharyngitis [common cold]: Secondary | ICD-10-CM | POA: Diagnosis not present

## 2018-05-29 DIAGNOSIS — J441 Chronic obstructive pulmonary disease with (acute) exacerbation: Secondary | ICD-10-CM | POA: Diagnosis not present

## 2018-05-29 DIAGNOSIS — Z23 Encounter for immunization: Secondary | ICD-10-CM | POA: Diagnosis not present

## 2018-05-29 DIAGNOSIS — E119 Type 2 diabetes mellitus without complications: Secondary | ICD-10-CM | POA: Diagnosis not present

## 2018-06-06 ENCOUNTER — Ambulatory Visit (INDEPENDENT_AMBULATORY_CARE_PROVIDER_SITE_OTHER): Payer: Medicare Other | Admitting: *Deleted

## 2018-06-06 DIAGNOSIS — I495 Sick sinus syndrome: Secondary | ICD-10-CM

## 2018-06-06 NOTE — Progress Notes (Signed)
Remote pacemaker transmission.   

## 2018-06-09 ENCOUNTER — Encounter: Payer: Self-pay | Admitting: Cardiology

## 2018-08-01 DIAGNOSIS — H04123 Dry eye syndrome of bilateral lacrimal glands: Secondary | ICD-10-CM | POA: Diagnosis not present

## 2018-08-06 DIAGNOSIS — E6609 Other obesity due to excess calories: Secondary | ICD-10-CM | POA: Diagnosis not present

## 2018-08-06 DIAGNOSIS — I10 Essential (primary) hypertension: Secondary | ICD-10-CM | POA: Diagnosis not present

## 2018-08-06 DIAGNOSIS — Z6830 Body mass index (BMI) 30.0-30.9, adult: Secondary | ICD-10-CM | POA: Diagnosis not present

## 2018-08-06 DIAGNOSIS — N3946 Mixed incontinence: Secondary | ICD-10-CM | POA: Diagnosis not present

## 2018-08-06 DIAGNOSIS — E782 Mixed hyperlipidemia: Secondary | ICD-10-CM | POA: Diagnosis not present

## 2018-08-06 DIAGNOSIS — M069 Rheumatoid arthritis, unspecified: Secondary | ICD-10-CM | POA: Diagnosis not present

## 2018-08-06 DIAGNOSIS — E1165 Type 2 diabetes mellitus with hyperglycemia: Secondary | ICD-10-CM | POA: Diagnosis not present

## 2018-08-06 DIAGNOSIS — I482 Chronic atrial fibrillation, unspecified: Secondary | ICD-10-CM | POA: Diagnosis not present

## 2018-08-09 LAB — CUP PACEART REMOTE DEVICE CHECK
Battery Impedance: 160 Ohm
Battery Remaining Longevity: 157 mo
Brady Statistic AP VP Percent: 0 %
Brady Statistic AS VP Percent: 0 %
Date Time Interrogation Session: 20191107143130
Implantable Lead Implant Date: 20061110
Implantable Lead Location: 753860
Implantable Lead Model: 5076
Implantable Pulse Generator Implant Date: 20170106
Lead Channel Impedance Value: 539 Ohm
Lead Channel Pacing Threshold Amplitude: 0.5 V
Lead Channel Setting Pacing Amplitude: 1.5 V
Lead Channel Setting Pacing Amplitude: 2 V
MDC IDC LEAD IMPLANT DT: 20061110
MDC IDC LEAD LOCATION: 753859
MDC IDC MSMT BATTERY VOLTAGE: 2.8 V
MDC IDC MSMT LEADCHNL RA PACING THRESHOLD PULSEWIDTH: 0.4 ms
MDC IDC MSMT LEADCHNL RV IMPEDANCE VALUE: 684 Ohm
MDC IDC MSMT LEADCHNL RV PACING THRESHOLD AMPLITUDE: 0.875 V
MDC IDC MSMT LEADCHNL RV PACING THRESHOLD PULSEWIDTH: 0.4 ms
MDC IDC SET LEADCHNL RV PACING PULSEWIDTH: 0.4 ms
MDC IDC SET LEADCHNL RV SENSING SENSITIVITY: 5.6 mV
MDC IDC STAT BRADY AP VS PERCENT: 2 %
MDC IDC STAT BRADY AS VS PERCENT: 98 %

## 2018-09-05 ENCOUNTER — Ambulatory Visit (INDEPENDENT_AMBULATORY_CARE_PROVIDER_SITE_OTHER): Payer: Medicare Other

## 2018-09-05 DIAGNOSIS — I495 Sick sinus syndrome: Secondary | ICD-10-CM

## 2018-09-06 LAB — CUP PACEART REMOTE DEVICE CHECK
Battery Impedance: 184 Ohm
Brady Statistic AP VS Percent: 2 %
Brady Statistic AS VS Percent: 98 %
Implantable Lead Implant Date: 20061110
Implantable Lead Implant Date: 20061110
Implantable Lead Location: 753859
Implantable Lead Model: 5076
Lead Channel Impedance Value: 689 Ohm
Lead Channel Pacing Threshold Pulse Width: 0.4 ms
Lead Channel Setting Pacing Amplitude: 2 V
Lead Channel Setting Sensing Sensitivity: 5.6 mV
MDC IDC LEAD LOCATION: 753860
MDC IDC MSMT BATTERY REMAINING LONGEVITY: 152 mo
MDC IDC MSMT BATTERY VOLTAGE: 2.79 V
MDC IDC MSMT LEADCHNL RA IMPEDANCE VALUE: 613 Ohm
MDC IDC MSMT LEADCHNL RA PACING THRESHOLD AMPLITUDE: 0.5 V
MDC IDC MSMT LEADCHNL RV PACING THRESHOLD AMPLITUDE: 0.875 V
MDC IDC MSMT LEADCHNL RV PACING THRESHOLD PULSEWIDTH: 0.4 ms
MDC IDC PG IMPLANT DT: 20170106
MDC IDC SESS DTM: 20200206162554
MDC IDC SET LEADCHNL RA PACING AMPLITUDE: 1.5 V
MDC IDC SET LEADCHNL RV PACING PULSEWIDTH: 0.4 ms
MDC IDC STAT BRADY AP VP PERCENT: 0 %
MDC IDC STAT BRADY AS VP PERCENT: 0 %

## 2018-09-10 DIAGNOSIS — R3914 Feeling of incomplete bladder emptying: Secondary | ICD-10-CM | POA: Diagnosis not present

## 2018-09-10 DIAGNOSIS — N390 Urinary tract infection, site not specified: Secondary | ICD-10-CM | POA: Diagnosis not present

## 2018-09-10 DIAGNOSIS — N3080 Other cystitis without hematuria: Secondary | ICD-10-CM | POA: Diagnosis not present

## 2018-09-17 NOTE — Progress Notes (Signed)
Remote pacemaker transmission.   

## 2018-10-02 DIAGNOSIS — I482 Chronic atrial fibrillation, unspecified: Secondary | ICD-10-CM | POA: Diagnosis not present

## 2018-10-02 DIAGNOSIS — E782 Mixed hyperlipidemia: Secondary | ICD-10-CM | POA: Diagnosis not present

## 2018-10-02 DIAGNOSIS — I10 Essential (primary) hypertension: Secondary | ICD-10-CM | POA: Diagnosis not present

## 2018-10-02 DIAGNOSIS — E1165 Type 2 diabetes mellitus with hyperglycemia: Secondary | ICD-10-CM | POA: Diagnosis not present

## 2018-10-10 DIAGNOSIS — N3946 Mixed incontinence: Secondary | ICD-10-CM | POA: Diagnosis not present

## 2018-10-10 DIAGNOSIS — I482 Chronic atrial fibrillation, unspecified: Secondary | ICD-10-CM | POA: Diagnosis not present

## 2018-10-10 DIAGNOSIS — E1129 Type 2 diabetes mellitus with other diabetic kidney complication: Secondary | ICD-10-CM | POA: Diagnosis not present

## 2018-10-10 DIAGNOSIS — Z6829 Body mass index (BMI) 29.0-29.9, adult: Secondary | ICD-10-CM | POA: Diagnosis not present

## 2018-10-10 DIAGNOSIS — J209 Acute bronchitis, unspecified: Secondary | ICD-10-CM | POA: Diagnosis not present

## 2018-10-10 DIAGNOSIS — E782 Mixed hyperlipidemia: Secondary | ICD-10-CM | POA: Diagnosis not present

## 2018-10-10 DIAGNOSIS — E1122 Type 2 diabetes mellitus with diabetic chronic kidney disease: Secondary | ICD-10-CM | POA: Diagnosis not present

## 2018-10-10 DIAGNOSIS — I10 Essential (primary) hypertension: Secondary | ICD-10-CM | POA: Diagnosis not present

## 2018-11-29 DIAGNOSIS — Z6829 Body mass index (BMI) 29.0-29.9, adult: Secondary | ICD-10-CM | POA: Diagnosis not present

## 2018-11-29 DIAGNOSIS — J0101 Acute recurrent maxillary sinusitis: Secondary | ICD-10-CM | POA: Diagnosis not present

## 2018-12-12 ENCOUNTER — Ambulatory Visit (INDEPENDENT_AMBULATORY_CARE_PROVIDER_SITE_OTHER): Payer: Medicare Other | Admitting: *Deleted

## 2018-12-12 ENCOUNTER — Other Ambulatory Visit: Payer: Self-pay

## 2018-12-12 DIAGNOSIS — I495 Sick sinus syndrome: Secondary | ICD-10-CM | POA: Diagnosis not present

## 2018-12-12 LAB — CUP PACEART REMOTE DEVICE CHECK
Battery Impedance: 184 Ohm
Battery Remaining Longevity: 152 mo
Battery Voltage: 2.79 V
Brady Statistic AP VP Percent: 0 %
Brady Statistic AP VS Percent: 2 %
Brady Statistic AS VP Percent: 0 %
Brady Statistic AS VS Percent: 98 %
Date Time Interrogation Session: 20200514172952
Implantable Lead Implant Date: 20061110
Implantable Lead Implant Date: 20061110
Implantable Lead Location: 753859
Implantable Lead Location: 753860
Implantable Lead Model: 5076
Implantable Lead Model: 5076
Implantable Pulse Generator Implant Date: 20170106
Lead Channel Impedance Value: 576 Ohm
Lead Channel Impedance Value: 707 Ohm
Lead Channel Pacing Threshold Amplitude: 0.5 V
Lead Channel Pacing Threshold Amplitude: 0.875 V
Lead Channel Pacing Threshold Pulse Width: 0.4 ms
Lead Channel Pacing Threshold Pulse Width: 0.4 ms
Lead Channel Sensing Intrinsic Amplitude: 11.2 mV
Lead Channel Sensing Intrinsic Amplitude: 2.8 mV
Lead Channel Setting Pacing Amplitude: 1.5 V
Lead Channel Setting Pacing Amplitude: 2 V
Lead Channel Setting Pacing Pulse Width: 0.4 ms
Lead Channel Setting Sensing Sensitivity: 5.6 mV

## 2018-12-19 ENCOUNTER — Encounter: Payer: Self-pay | Admitting: Cardiology

## 2018-12-19 NOTE — Progress Notes (Signed)
Remote pacemaker transmission.   

## 2018-12-31 DIAGNOSIS — E1129 Type 2 diabetes mellitus with other diabetic kidney complication: Secondary | ICD-10-CM | POA: Diagnosis not present

## 2018-12-31 DIAGNOSIS — E782 Mixed hyperlipidemia: Secondary | ICD-10-CM | POA: Diagnosis not present

## 2018-12-31 DIAGNOSIS — I10 Essential (primary) hypertension: Secondary | ICD-10-CM | POA: Diagnosis not present

## 2018-12-31 DIAGNOSIS — I482 Chronic atrial fibrillation, unspecified: Secondary | ICD-10-CM | POA: Diagnosis not present

## 2019-01-02 DIAGNOSIS — I10 Essential (primary) hypertension: Secondary | ICD-10-CM | POA: Diagnosis not present

## 2019-01-02 DIAGNOSIS — E782 Mixed hyperlipidemia: Secondary | ICD-10-CM | POA: Diagnosis not present

## 2019-01-02 DIAGNOSIS — Z6829 Body mass index (BMI) 29.0-29.9, adult: Secondary | ICD-10-CM | POA: Diagnosis not present

## 2019-01-02 DIAGNOSIS — E1122 Type 2 diabetes mellitus with diabetic chronic kidney disease: Secondary | ICD-10-CM | POA: Diagnosis not present

## 2019-01-02 DIAGNOSIS — N3946 Mixed incontinence: Secondary | ICD-10-CM | POA: Diagnosis not present

## 2019-01-02 DIAGNOSIS — I482 Chronic atrial fibrillation, unspecified: Secondary | ICD-10-CM | POA: Diagnosis not present

## 2019-01-02 DIAGNOSIS — M069 Rheumatoid arthritis, unspecified: Secondary | ICD-10-CM | POA: Diagnosis not present

## 2019-01-02 DIAGNOSIS — E1129 Type 2 diabetes mellitus with other diabetic kidney complication: Secondary | ICD-10-CM | POA: Diagnosis not present

## 2019-01-27 DIAGNOSIS — R05 Cough: Secondary | ICD-10-CM | POA: Diagnosis not present

## 2019-01-27 DIAGNOSIS — R509 Fever, unspecified: Secondary | ICD-10-CM | POA: Diagnosis not present

## 2019-01-28 ENCOUNTER — Other Ambulatory Visit: Payer: Medicare Other

## 2019-01-28 ENCOUNTER — Other Ambulatory Visit: Payer: Self-pay

## 2019-01-28 ENCOUNTER — Other Ambulatory Visit: Payer: Self-pay | Admitting: *Deleted

## 2019-01-28 DIAGNOSIS — Z20822 Contact with and (suspected) exposure to covid-19: Secondary | ICD-10-CM

## 2019-01-28 DIAGNOSIS — R6889 Other general symptoms and signs: Secondary | ICD-10-CM | POA: Diagnosis not present

## 2019-02-01 LAB — NOVEL CORONAVIRUS, NAA: SARS-CoV-2, NAA: NOT DETECTED

## 2019-02-06 DIAGNOSIS — H2513 Age-related nuclear cataract, bilateral: Secondary | ICD-10-CM | POA: Diagnosis not present

## 2019-02-17 ENCOUNTER — Other Ambulatory Visit: Payer: Self-pay | Admitting: Internal Medicine

## 2019-02-17 MED ORDER — FUROSEMIDE 20 MG PO TABS: 20.0000 mg | ORAL_TABLET | Freq: Every day | ORAL | 3 refills | Status: DC

## 2019-02-17 NOTE — Telephone Encounter (Signed)
Refilled lasix 20 mg qd to [facility]

## 2019-03-11 DIAGNOSIS — N3080 Other cystitis without hematuria: Secondary | ICD-10-CM | POA: Diagnosis not present

## 2019-03-13 ENCOUNTER — Ambulatory Visit (INDEPENDENT_AMBULATORY_CARE_PROVIDER_SITE_OTHER): Payer: Medicare Other | Admitting: *Deleted

## 2019-03-13 DIAGNOSIS — I495 Sick sinus syndrome: Secondary | ICD-10-CM | POA: Diagnosis not present

## 2019-03-13 LAB — CUP PACEART REMOTE DEVICE CHECK
Battery Impedance: 184 Ohm
Battery Remaining Longevity: 151 mo
Battery Voltage: 2.8 V
Brady Statistic AP VP Percent: 0 %
Brady Statistic AP VS Percent: 2 %
Brady Statistic AS VP Percent: 0 %
Brady Statistic AS VS Percent: 98 %
Date Time Interrogation Session: 20200813110422
Implantable Lead Implant Date: 20061110
Implantable Lead Implant Date: 20061110
Implantable Lead Location: 753859
Implantable Lead Location: 753860
Implantable Lead Model: 5076
Implantable Lead Model: 5076
Implantable Pulse Generator Implant Date: 20170106
Lead Channel Impedance Value: 555 Ohm
Lead Channel Impedance Value: 561 Ohm
Lead Channel Pacing Threshold Amplitude: 0.5 V
Lead Channel Pacing Threshold Amplitude: 0.875 V
Lead Channel Pacing Threshold Pulse Width: 0.4 ms
Lead Channel Pacing Threshold Pulse Width: 0.4 ms
Lead Channel Sensing Intrinsic Amplitude: 1.4 mV
Lead Channel Sensing Intrinsic Amplitude: 11.2 mV
Lead Channel Setting Pacing Amplitude: 1.5 V
Lead Channel Setting Pacing Amplitude: 2 V
Lead Channel Setting Pacing Pulse Width: 0.4 ms
Lead Channel Setting Sensing Sensitivity: 5.6 mV

## 2019-03-24 ENCOUNTER — Encounter: Payer: Self-pay | Admitting: Cardiology

## 2019-03-24 NOTE — Progress Notes (Signed)
Remote pacemaker transmission.   

## 2019-03-31 DIAGNOSIS — H25041 Posterior subcapsular polar age-related cataract, right eye: Secondary | ICD-10-CM | POA: Diagnosis not present

## 2019-03-31 DIAGNOSIS — H2513 Age-related nuclear cataract, bilateral: Secondary | ICD-10-CM | POA: Diagnosis not present

## 2019-03-31 DIAGNOSIS — H2512 Age-related nuclear cataract, left eye: Secondary | ICD-10-CM | POA: Diagnosis not present

## 2019-03-31 DIAGNOSIS — E119 Type 2 diabetes mellitus without complications: Secondary | ICD-10-CM | POA: Diagnosis not present

## 2019-03-31 DIAGNOSIS — H25013 Cortical age-related cataract, bilateral: Secondary | ICD-10-CM | POA: Diagnosis not present

## 2019-03-31 DIAGNOSIS — H35033 Hypertensive retinopathy, bilateral: Secondary | ICD-10-CM | POA: Diagnosis not present

## 2019-04-08 DIAGNOSIS — H25812 Combined forms of age-related cataract, left eye: Secondary | ICD-10-CM | POA: Diagnosis not present

## 2019-04-08 DIAGNOSIS — H2512 Age-related nuclear cataract, left eye: Secondary | ICD-10-CM | POA: Diagnosis not present

## 2019-04-18 DIAGNOSIS — Z6830 Body mass index (BMI) 30.0-30.9, adult: Secondary | ICD-10-CM | POA: Diagnosis not present

## 2019-04-18 DIAGNOSIS — I10 Essential (primary) hypertension: Secondary | ICD-10-CM | POA: Diagnosis not present

## 2019-04-18 DIAGNOSIS — R4582 Worries: Secondary | ICD-10-CM | POA: Diagnosis not present

## 2019-04-18 DIAGNOSIS — E1129 Type 2 diabetes mellitus with other diabetic kidney complication: Secondary | ICD-10-CM | POA: Diagnosis not present

## 2019-04-18 DIAGNOSIS — Z23 Encounter for immunization: Secondary | ICD-10-CM | POA: Diagnosis not present

## 2019-04-18 DIAGNOSIS — E1122 Type 2 diabetes mellitus with diabetic chronic kidney disease: Secondary | ICD-10-CM | POA: Diagnosis not present

## 2019-04-18 DIAGNOSIS — Z1389 Encounter for screening for other disorder: Secondary | ICD-10-CM | POA: Diagnosis not present

## 2019-04-18 DIAGNOSIS — Z1331 Encounter for screening for depression: Secondary | ICD-10-CM | POA: Diagnosis not present

## 2019-05-19 DIAGNOSIS — H2511 Age-related nuclear cataract, right eye: Secondary | ICD-10-CM | POA: Diagnosis not present

## 2019-05-19 DIAGNOSIS — H25041 Posterior subcapsular polar age-related cataract, right eye: Secondary | ICD-10-CM | POA: Diagnosis not present

## 2019-05-19 DIAGNOSIS — H25011 Cortical age-related cataract, right eye: Secondary | ICD-10-CM | POA: Diagnosis not present

## 2019-05-27 DIAGNOSIS — H25041 Posterior subcapsular polar age-related cataract, right eye: Secondary | ICD-10-CM | POA: Diagnosis not present

## 2019-05-27 DIAGNOSIS — H25011 Cortical age-related cataract, right eye: Secondary | ICD-10-CM | POA: Diagnosis not present

## 2019-05-27 DIAGNOSIS — H2511 Age-related nuclear cataract, right eye: Secondary | ICD-10-CM | POA: Diagnosis not present

## 2019-05-27 DIAGNOSIS — H25811 Combined forms of age-related cataract, right eye: Secondary | ICD-10-CM | POA: Diagnosis not present

## 2019-06-12 ENCOUNTER — Ambulatory Visit (INDEPENDENT_AMBULATORY_CARE_PROVIDER_SITE_OTHER): Payer: Medicare Other | Admitting: *Deleted

## 2019-06-12 DIAGNOSIS — I480 Paroxysmal atrial fibrillation: Secondary | ICD-10-CM | POA: Diagnosis not present

## 2019-06-12 DIAGNOSIS — I495 Sick sinus syndrome: Secondary | ICD-10-CM

## 2019-06-12 LAB — CUP PACEART REMOTE DEVICE CHECK
Battery Impedance: 208 Ohm
Battery Remaining Longevity: 146 mo
Battery Voltage: 2.79 V
Brady Statistic AP VP Percent: 0 %
Brady Statistic AP VS Percent: 2 %
Brady Statistic AS VP Percent: 0 %
Brady Statistic AS VS Percent: 98 %
Date Time Interrogation Session: 20201112174648
Implantable Lead Implant Date: 20061110
Implantable Lead Implant Date: 20061110
Implantable Lead Location: 753859
Implantable Lead Location: 753860
Implantable Lead Model: 5076
Implantable Lead Model: 5076
Implantable Pulse Generator Implant Date: 20170106
Lead Channel Impedance Value: 530 Ohm
Lead Channel Impedance Value: 650 Ohm
Lead Channel Pacing Threshold Amplitude: 0.5 V
Lead Channel Pacing Threshold Amplitude: 0.875 V
Lead Channel Pacing Threshold Pulse Width: 0.4 ms
Lead Channel Pacing Threshold Pulse Width: 0.4 ms
Lead Channel Setting Pacing Amplitude: 1.5 V
Lead Channel Setting Pacing Amplitude: 2 V
Lead Channel Setting Pacing Pulse Width: 0.4 ms
Lead Channel Setting Sensing Sensitivity: 5.6 mV

## 2019-06-13 DIAGNOSIS — J441 Chronic obstructive pulmonary disease with (acute) exacerbation: Secondary | ICD-10-CM | POA: Diagnosis not present

## 2019-07-04 NOTE — Progress Notes (Signed)
Remote pacemaker transmission.   

## 2019-08-05 ENCOUNTER — Ambulatory Visit (INDEPENDENT_AMBULATORY_CARE_PROVIDER_SITE_OTHER): Payer: Medicare Other | Admitting: Internal Medicine

## 2019-08-05 ENCOUNTER — Other Ambulatory Visit: Payer: Self-pay

## 2019-08-05 ENCOUNTER — Encounter: Payer: Self-pay | Admitting: Internal Medicine

## 2019-08-05 VITALS — BP 160/78 | HR 84 | Ht 65.0 in | Wt 170.0 lb

## 2019-08-05 DIAGNOSIS — I480 Paroxysmal atrial fibrillation: Secondary | ICD-10-CM | POA: Diagnosis not present

## 2019-08-05 DIAGNOSIS — I495 Sick sinus syndrome: Secondary | ICD-10-CM | POA: Diagnosis not present

## 2019-08-05 NOTE — Progress Notes (Signed)
HPI Tracey Koch returns today for followup. She is a pleasant 74yo woman with a h/o symptomatic bradycardia, s/p PPM, HTN, arthritis, and DM. In the interim, she has been stable. No chest pain or sob. No syncope. She notes occaisional palpitations. She has some arthritic symptoms. She notes that her BP has been on the high side this summer. She notes some peripheral edema. She has b een trying to stop smoking and is down to 1/2 ppd.  Allergies  Allergen Reactions  . Penicillins Shortness Of Breath, Itching, Swelling and Rash    Has patient had a PCN reaction causing immediate rash, facial/tongue/throat swelling, SOB or lightheadedness with hypotension: Yes Has patient had a PCN reaction causing severe rash involving mucus membranes or skin necrosis: No Has patient had a PCN reaction that required hospitalization No Has patient had a PCN reaction occurring within the last 10 years: No If all of the above answers are "NO", then may proceed with Cephalosporin use.   Marland Kitchen Macrobid [Nitrofurantoin] Hives, Diarrhea, Itching and Nausea And Vomiting  . Codeine Itching, Swelling and Rash  . Levaquin [Levofloxacin In D5w] Itching, Swelling and Other (See Comments)    Can take low dose  . Sulfa Drugs Cross Reactors Itching, Swelling and Rash     Current Outpatient Medications  Medication Sig Dispense Refill  . albuterol (VENTOLIN HFA) 108 (90 Base) MCG/ACT inhaler Inhale into the lungs every 6 (six) hours as needed for wheezing or shortness of breath.    Marland Kitchen apixaban (ELIQUIS) 5 MG TABS tablet Take 1 tablet (5 mg total) by mouth 2 (two) times daily. 60 tablet 11  . furosemide (LASIX) 20 MG tablet Take 1 tablet (20 mg total) by mouth daily. 90 tablet 3  . HYDROcodone-acetaminophen (NORCO) 7.5-325 MG tablet Take 1 tablet by mouth every 6 (six) hours as needed for moderate pain.     Marland Kitchen HYDROcodone-acetaminophen (NORCO) 7.5-325 MG tablet Take 1 tablet by mouth 3 (three) times daily.     Marland Kitchen losartan  (COZAAR) 50 MG tablet Take 1 tablet (50 mg total) by mouth daily. 90 tablet 3  . metFORMIN (GLUCOPHAGE) 500 MG tablet Take 1,000 mg by mouth 2 (two) times daily with a meal.     . Omega-3 Fatty Acids (OMEGA-3 FISH OIL PO) Take 1 capsule by mouth 2 (two) times daily. OMEGA XL    . simvastatin (ZOCOR) 20 MG tablet Take 20 mg by mouth every evening.    . solifenacin (VESICARE) 5 MG tablet Take 5 mg by mouth daily.    . traZODone (DESYREL) 150 MG tablet Take 150 mg by mouth at bedtime.     No current facility-administered medications for this visit.     Past Medical History:  Diagnosis Date  . Anemia    takes Folic Acid daily  . Arthritis    takes Plaquenil daily and Methotrexate 2 times a week  . Cough    takes Allegra daily  . Depression    takes Prozac at bedtime  . Diabetes mellitus without complication ([place])    takes Metformin daily  . Family history of anesthesia complication    sister post op N/V  . History of blood transfusion   . History of UTI   . Hyperlipidemia    takes Simvastatin nightly  . Joint pain   . Joint swelling   . Pacemaker   . Seasonal allergies    takes Allegra daily  . Sinoatrial node dysfunction (HCC)  has ICD    ROS:   All systems reviewed and negative except as noted in the HPI.   Past Surgical History:  Procedure Laterality Date  . ABDOMINAL HYSTERECTOMY    . CHOLECYSTECTOMY    . COLONOSCOPY    . EP IMPLANTABLE DEVICE N/A 08/06/2015   Procedure: PPM Generator Changeout;  Surgeon: Marinus Maw, MD;  Location: [facility] INVASIVE CV LAB;  Service: Cardiovascular;  Laterality: N/A;  . INSERT / REPLACE / REMOVE PACEMAKER    . right knee replacement    . TOTAL KNEE ARTHROPLASTY Left 05/06/2013   Dr Cleophas Dunker  . TOTAL KNEE ARTHROPLASTY Left 05/06/2013   Procedure: TOTAL KNEE ARTHROPLASTY;  Surgeon: [name], MD;  Location: [facility] OR;  Service: Orthopedics;  Laterality: Left;     Family History  Problem Relation Age of Onset  . COPD Mother    . Stroke Father   . Heart attack Father   . Heart failure Sister   . Diabetes Sister   . CAD Brother   . Diabetes Brother   . Multiple sclerosis Sister   . CAD Brother   . Diabetes Brother   . Renal cancer Brother      Social History   Socioeconomic History  . Marital status: Married    Spouse name: Not on file  . Number of children: Not on file  . Years of education: Not on file  . Highest education level: Not on file  Occupational History  . Not on file  Tobacco Use  . Smoking status: Current Every Day Smoker    Packs/day: 1.00    Years: 52.00    Pack years: 52.00    Types: Cigarettes    Start date: 03/01/1962    Last attempt to quit: 02/27/2014    Years since quitting: 5.4  . Smokeless tobacco: Never Used  Substance and Sexual Activity  . Alcohol use: No    Alcohol/week: 0.0 standard drinks  . Drug use: No  . Sexual activity: Not Currently    Birth control/protection: Surgical  Other Topics Concern  . Not on file  Social History Narrative  . Not on file   Social Determinants of Health   Financial Resource Strain:   . Difficulty of Paying Living Expenses: Not on file  Food Insecurity:   . Worried About Programme researcher, broadcasting/film/video in the Last Year: Not on file  . Ran Out of Food in the Last Year: Not on file  Transportation Needs:   . Lack of Transportation (Medical): Not on file  . Lack of Transportation (Non-Medical): Not on file  Physical Activity:   . Days of Exercise per Week: Not on file  . Minutes of Exercise per Session: Not on file  Stress:   . Feeling of Stress : Not on file  Social Connections:   . Frequency of Communication with Friends and Family: Not on file  . Frequency of Social Gatherings with Friends and Family: Not on file  . Attends Religious Services: Not on file  . Active Member of Clubs or Organizations: Not on file  . Attends Banker Meetings: Not on file  . Marital Status: Not on file  Intimate Partner Violence:   .  Fear of Current or Ex-Partner: Not on file  . Emotionally Abused: Not on file  . Physically Abused: Not on file  . Sexually Abused: Not on file     BP (!) 160/78   Pulse 84   Ht 5\' 5"  (  1.651 m)   Wt 170 lb (77.1 kg)   SpO2 94%   BMI 28.29 kg/m   Physical Exam:  Well appearing NAD HEENT: Unremarkable Neck:  6 cm JVD, no thyromegally Lymphatics:  No adenopathy Back:  No CVA tenderness Lungs:  Clear with no wheezes HEART:  Regular rate rhythm, no murmurs, no rubs, no clicks Abd:  soft, positive bowel sounds, no organomegally, no rebound, no guarding Ext:  2 plus pulses, no edema, no cyanosis, no clubbing Skin:  No rashes no nodules Neuro:  CN II through XII intact, motor grossly intact  DEVICE  Normal device function.  See PaceArt for details.   Assess/Plan: 1. Tobacco abuse - I encouraged the patien to stop smoking. She states that she stopped cold [place] in the past. 2. HTN - her bp is up today. She states that it is controlled at home. I encouraged her to reduce her salt intake. 3. PPM - her Medtronic DDD PM is working normally. We will recheck in several months.  Tracey Koch.D.

## 2019-08-05 NOTE — Patient Instructions (Signed)
Medication Instructions:  Your physician recommends that you continue on your current medications as directed. Please refer to the Current Medication list given to you today.  *If you need a refill on your cardiac medications before your next appointment, please call your pharmacy*  Lab Work: None  If you have labs (blood work) drawn today and your tests are completely normal, you will receive your results only by: . MyChart Message (if you have MyChart) OR . A paper copy in the mail If you have any lab test that is abnormal or we need to change your treatment, we will call you to review the results.  Testing/Procedures: None  Follow-Up: At [facility], you and your health needs are our priority.  As part of our continuing mission to provide you with exceptional heart care, we have created designated Provider Care Teams.  These Care Teams include your primary Cardiologist (physician) and Advanced Practice Providers (APPs -  Physician Assistants and Nurse Practitioners) who all work together to provide you with the care you need, when you need it.  Your next appointment:   12 month(s)  The format for your next appointment:   In Person  Provider:   Gregg Taylor, MD  Other Instructions None     Thank you for choosing [facility] !         

## 2019-08-15 DIAGNOSIS — N183 Chronic kidney disease, stage 3 unspecified: Secondary | ICD-10-CM | POA: Diagnosis not present

## 2019-08-15 DIAGNOSIS — E1122 Type 2 diabetes mellitus with diabetic chronic kidney disease: Secondary | ICD-10-CM | POA: Diagnosis not present

## 2019-08-15 DIAGNOSIS — E782 Mixed hyperlipidemia: Secondary | ICD-10-CM | POA: Diagnosis not present

## 2019-08-20 DIAGNOSIS — I10 Essential (primary) hypertension: Secondary | ICD-10-CM | POA: Diagnosis not present

## 2019-08-20 DIAGNOSIS — E1129 Type 2 diabetes mellitus with other diabetic kidney complication: Secondary | ICD-10-CM | POA: Diagnosis not present

## 2019-08-20 DIAGNOSIS — Z6829 Body mass index (BMI) 29.0-29.9, adult: Secondary | ICD-10-CM | POA: Diagnosis not present

## 2019-08-20 DIAGNOSIS — N3946 Mixed incontinence: Secondary | ICD-10-CM | POA: Diagnosis not present

## 2019-08-20 DIAGNOSIS — E782 Mixed hyperlipidemia: Secondary | ICD-10-CM | POA: Diagnosis not present

## 2019-08-20 DIAGNOSIS — E1122 Type 2 diabetes mellitus with diabetic chronic kidney disease: Secondary | ICD-10-CM | POA: Diagnosis not present

## 2019-08-20 DIAGNOSIS — I482 Chronic atrial fibrillation, unspecified: Secondary | ICD-10-CM | POA: Diagnosis not present

## 2019-08-20 DIAGNOSIS — M069 Rheumatoid arthritis, unspecified: Secondary | ICD-10-CM | POA: Diagnosis not present

## 2019-09-04 DIAGNOSIS — Z23 Encounter for immunization: Secondary | ICD-10-CM | POA: Diagnosis not present

## 2019-09-10 IMAGING — CT CT ABD-PELV W/ CM
2 of 5 series · 16 of 46 positions shown, 18 images · IV contrast (Isovue)
Comparison: 11/15/2009 CT abdomen and pelvis

CLINICAL DATA: 71 y/o  F; 2 weeks of bilateral flank pain.

EXAM:
CT ABDOMEN AND PELVIS WITH CONTRAST
TECHNIQUE: Multidetector CT imaging of the abdomen and pelvis was performed
using the standard protocol following bolus administration of
intravenous contrast.
CONTRAST:  100mL PES3D6-9HH IOPAMIDOL (PES3D6-9HH) INJECTION 61%

[Series 2: axial st · axial · 0.79mm/px · z∈[+987,+1367]mm · 13 of 86 slices shown, 15 images]
[im 5/86  soft-tissue]
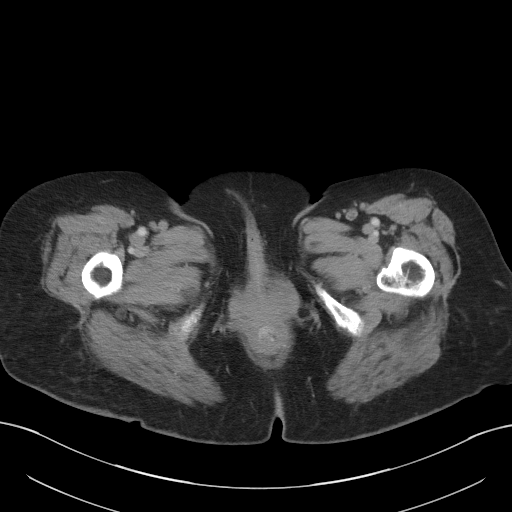
[im 5/86  bone]
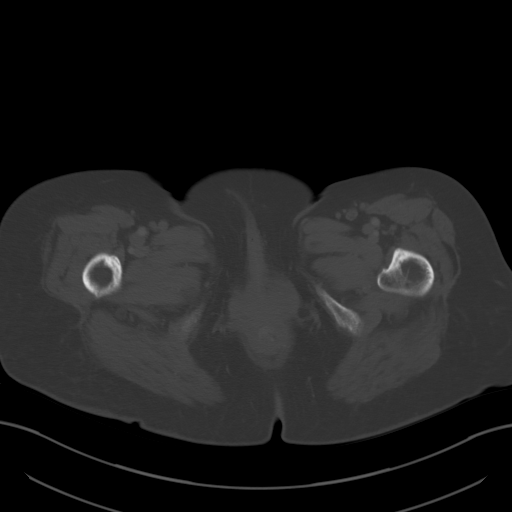
[im 10/86  soft-tissue]
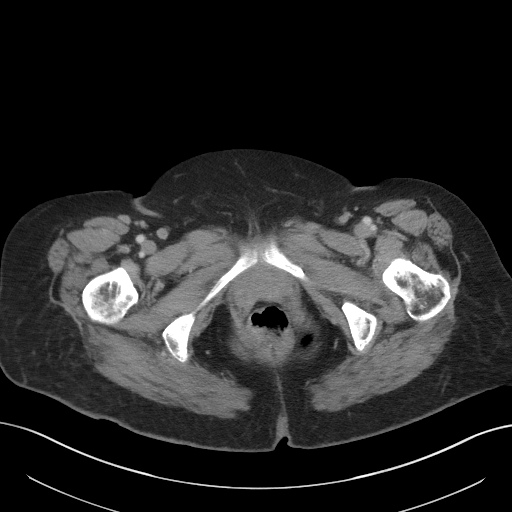
[im 19/86  soft-tissue]
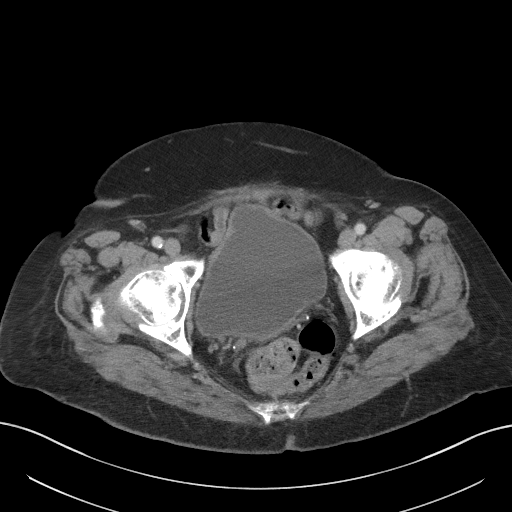
[im 24/86  soft-tissue]
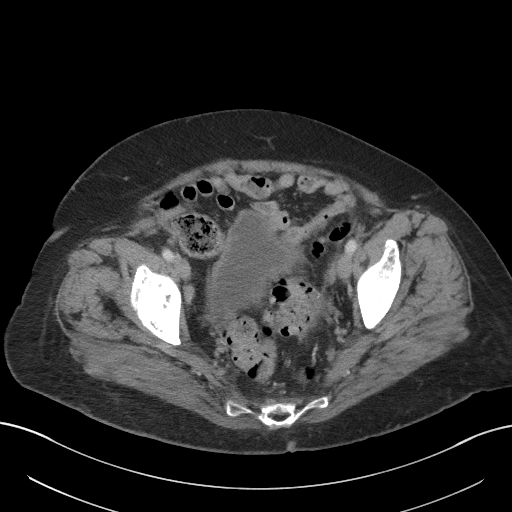
[im 29/86  soft-tissue]
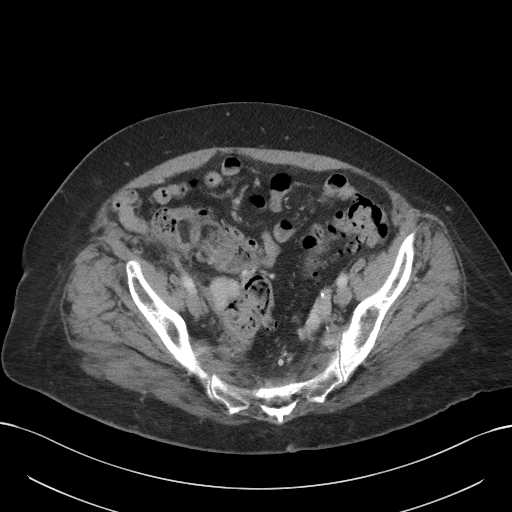
[im 38/86  soft-tissue]
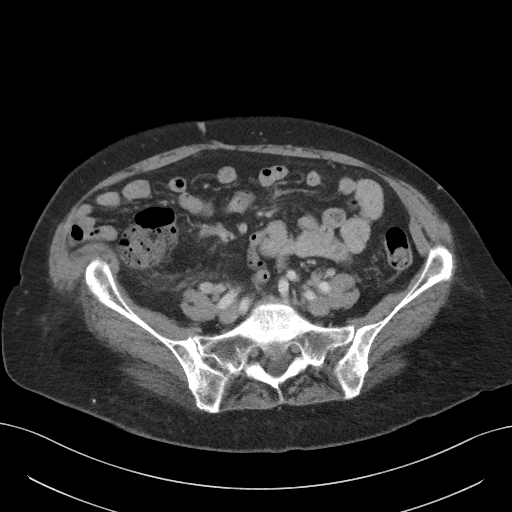
[im 43/86  soft-tissue]
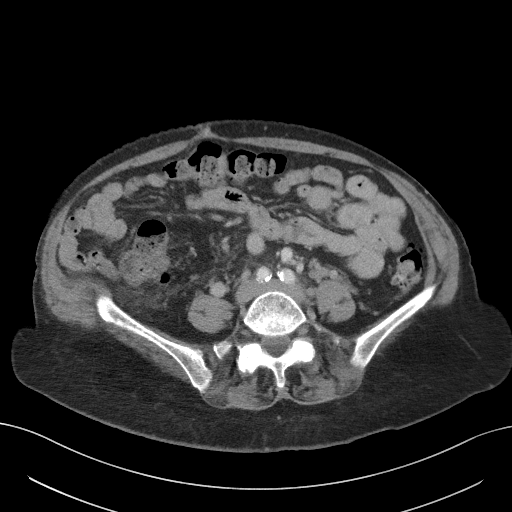
[im 48/86  soft-tissue]
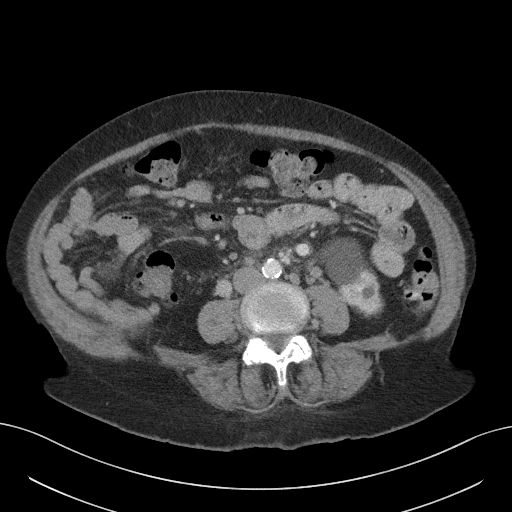
[im 57/86  soft-tissue]
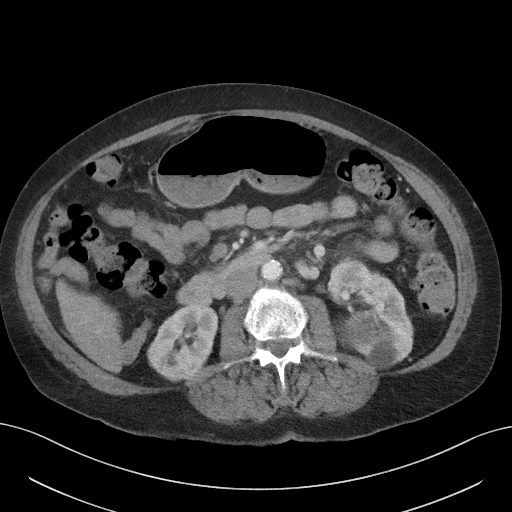
[im 57/86  bone]
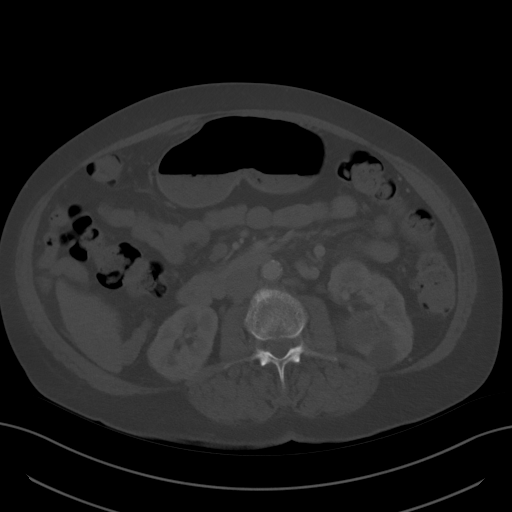
[im 62/86  soft-tissue]
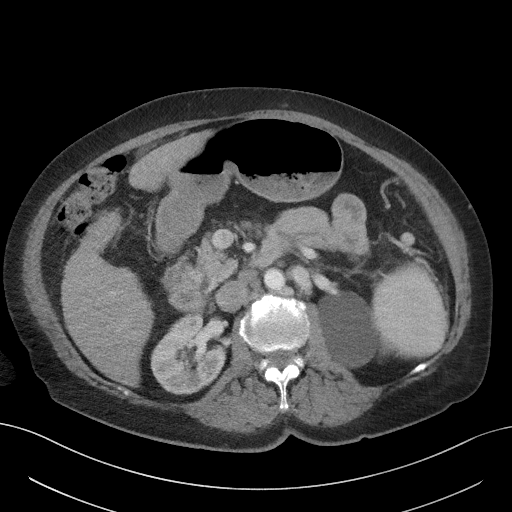
[im 67/86  soft-tissue]
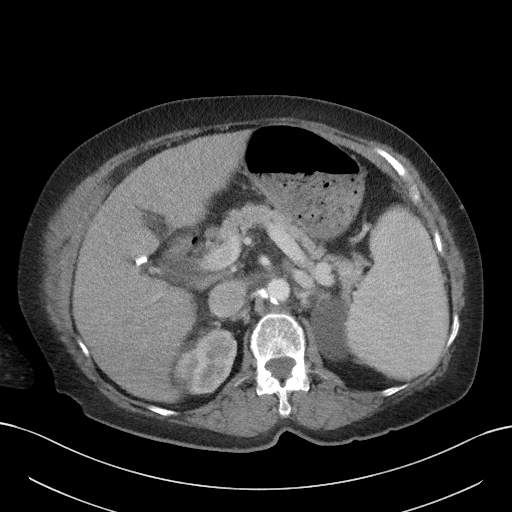
[im 76/86  soft-tissue]
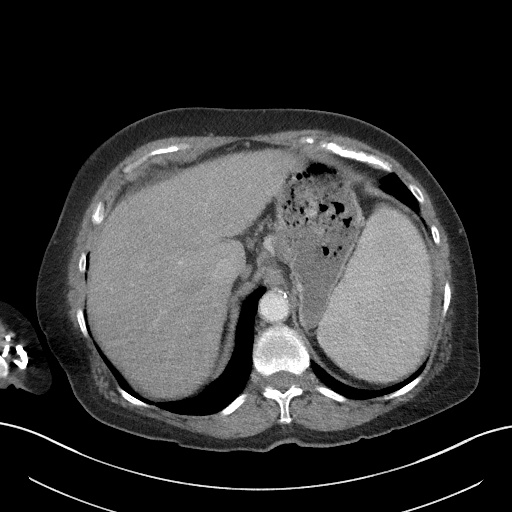
[im 81/86  soft-tissue]
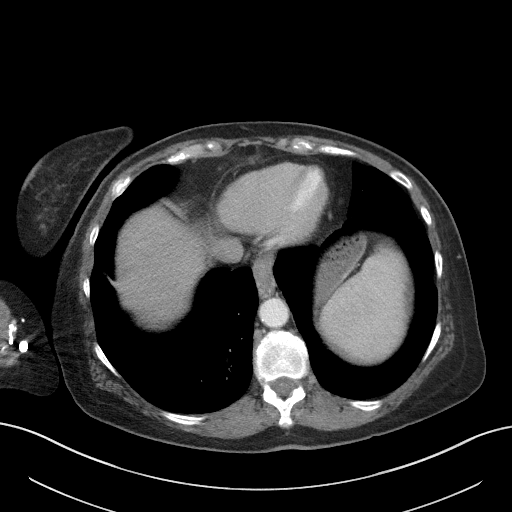

[Series 5: coronal st · coronal · 0.78mm/px · 3 of 94 slices shown]
[im 32/94  soft-tissue]
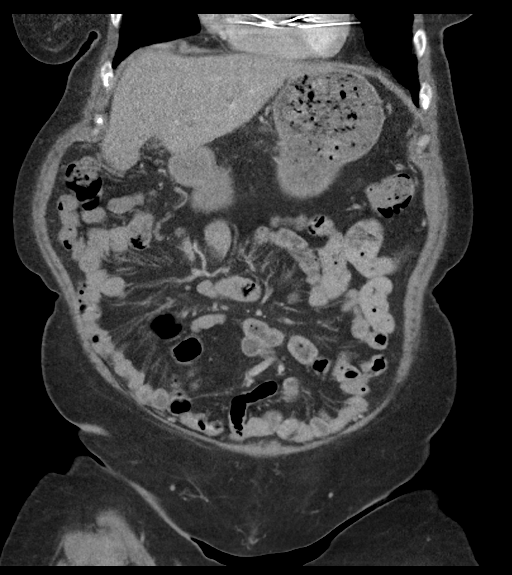
[im 42/94  soft-tissue]
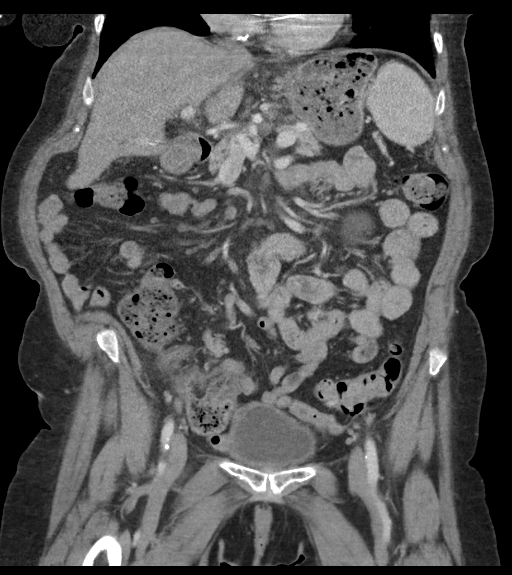
[im 52/94  soft-tissue]
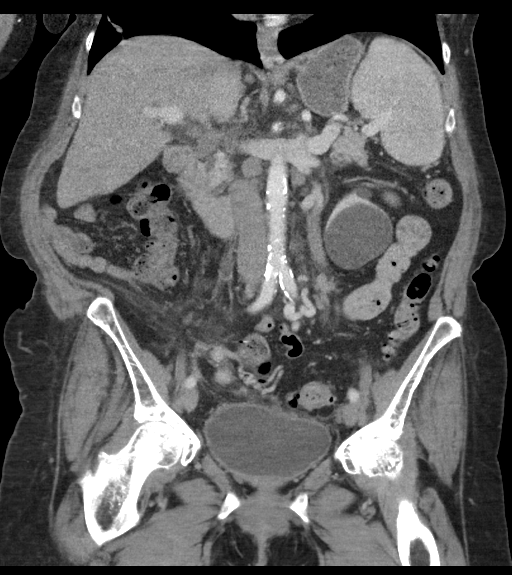

[16 of 46 positions shown; findings below may reference images not displayed]

FINDINGS: Lower chest: Coronary artery calcific atherosclerosis. Pacemaker
leads partially visualized within the right atrium and ventricle.

Hepatobiliary: No focal liver abnormality is seen. Status post
cholecystectomy. No biliary dilatation. Mild lobulation of liver
contour may represent cirrhosis.

Pancreas: Unremarkable. No pancreatic ductal dilatation or
surrounding inflammatory changes.

Spleen: The spleen measures 15.5 x 9.7 x 11.8 cm (volume = 930
cm^3)

Adrenals/Urinary Tract: Normal adrenal glands. Multiple kidney cysts
measuring up to 6.2 cm in the upper pole of left kidney.
Hydronephrosis. Normal bladder.

Stomach/Bowel: Stomach is within normal limits. Appendix not
identified, no pericecal inflammation. No evidence of bowel wall
thickening, distention, or inflammatory changes. Mild sigmoid
diverticulosis, no findings of acute diverticulitis.

Vascular/Lymphatic: Aortic atherosclerosis. No enlarged abdominal or
pelvic lymph nodes.

Reproductive: Status post hysterectomy. Enlarged gonadal veins
bilaterally possibly representing portal caval anastomoses or
related to pelvic congestion.

Other: Trace ascites.  No abdominal wall hernia.

Musculoskeletal: No fracture is seen.
IMPRESSION: 1. Splenomegaly, approximately 930 cc.
2. Mild lobulation of liver contour suggests cirrhosis. Trace
ascites.
3. Enlarged gonadal veins which may represent portal caval
anastomoses or pelvic congestion syndrome.
4. Aortic and coronary artery atherosclerosis.
5. Mild sigmoid diverticulosis, no findings of acute diverticulitis.

By: Alfredas Oksana M.D.

## 2019-09-11 ENCOUNTER — Ambulatory Visit (INDEPENDENT_AMBULATORY_CARE_PROVIDER_SITE_OTHER): Payer: Medicare Other | Admitting: *Deleted

## 2019-09-11 DIAGNOSIS — I480 Paroxysmal atrial fibrillation: Secondary | ICD-10-CM

## 2019-09-12 LAB — CUP PACEART REMOTE DEVICE CHECK
Battery Impedance: 233 Ohm
Battery Remaining Longevity: 141 mo
Battery Voltage: 2.8 V
Brady Statistic AP VP Percent: 0 %
Brady Statistic AP VS Percent: 1 %
Brady Statistic AS VP Percent: 0 %
Brady Statistic AS VS Percent: 99 %
Date Time Interrogation Session: 20210211105737
Implantable Lead Implant Date: 20061110
Implantable Lead Implant Date: 20061110
Implantable Lead Location: 753859
Implantable Lead Location: 753860
Implantable Lead Model: 5076
Implantable Lead Model: 5076
Implantable Pulse Generator Implant Date: 20170106
Lead Channel Impedance Value: 558 Ohm
Lead Channel Impedance Value: 650 Ohm
Lead Channel Pacing Threshold Amplitude: 0.5 V
Lead Channel Pacing Threshold Amplitude: 0.875 V
Lead Channel Pacing Threshold Pulse Width: 0.4 ms
Lead Channel Pacing Threshold Pulse Width: 0.4 ms
Lead Channel Setting Pacing Amplitude: 1.5 V
Lead Channel Setting Pacing Amplitude: 2 V
Lead Channel Setting Pacing Pulse Width: 0.4 ms
Lead Channel Setting Sensing Sensitivity: 5.6 mV

## 2019-09-12 NOTE — Progress Notes (Signed)
PPM Remote  

## 2019-09-17 DIAGNOSIS — Z20828 Contact with and (suspected) exposure to other viral communicable diseases: Secondary | ICD-10-CM | POA: Diagnosis not present

## 2019-09-17 DIAGNOSIS — J441 Chronic obstructive pulmonary disease with (acute) exacerbation: Secondary | ICD-10-CM | POA: Diagnosis not present

## 2019-10-03 DIAGNOSIS — Z23 Encounter for immunization: Secondary | ICD-10-CM | POA: Diagnosis not present

## 2019-12-11 ENCOUNTER — Ambulatory Visit (INDEPENDENT_AMBULATORY_CARE_PROVIDER_SITE_OTHER): Payer: Medicare Other | Admitting: *Deleted

## 2019-12-11 DIAGNOSIS — I495 Sick sinus syndrome: Secondary | ICD-10-CM | POA: Diagnosis not present

## 2019-12-11 LAB — CUP PACEART REMOTE DEVICE CHECK
Battery Impedance: 232 Ohm
Battery Remaining Longevity: 142 mo
Battery Voltage: 2.8 V
Brady Statistic AP VP Percent: 0 %
Brady Statistic AP VS Percent: 2 %
Brady Statistic AS VP Percent: 0 %
Brady Statistic AS VS Percent: 98 %
Date Time Interrogation Session: 20210513104133
Implantable Lead Implant Date: 20061110
Implantable Lead Implant Date: 20061110
Implantable Lead Location: 753859
Implantable Lead Location: 753860
Implantable Lead Model: 5076
Implantable Lead Model: 5076
Implantable Pulse Generator Implant Date: 20170106
Lead Channel Impedance Value: 524 Ohm
Lead Channel Impedance Value: 680 Ohm
Lead Channel Pacing Threshold Amplitude: 0.5 V
Lead Channel Pacing Threshold Amplitude: 0.75 V
Lead Channel Pacing Threshold Pulse Width: 0.4 ms
Lead Channel Pacing Threshold Pulse Width: 0.4 ms
Lead Channel Setting Pacing Amplitude: 1.5 V
Lead Channel Setting Pacing Amplitude: 2 V
Lead Channel Setting Pacing Pulse Width: 0.4 ms
Lead Channel Setting Sensing Sensitivity: 5.6 mV

## 2019-12-12 DIAGNOSIS — E782 Mixed hyperlipidemia: Secondary | ICD-10-CM | POA: Diagnosis not present

## 2019-12-12 DIAGNOSIS — I10 Essential (primary) hypertension: Secondary | ICD-10-CM | POA: Diagnosis not present

## 2019-12-12 DIAGNOSIS — I482 Chronic atrial fibrillation, unspecified: Secondary | ICD-10-CM | POA: Diagnosis not present

## 2019-12-12 DIAGNOSIS — N183 Chronic kidney disease, stage 3 unspecified: Secondary | ICD-10-CM | POA: Diagnosis not present

## 2019-12-12 DIAGNOSIS — E1122 Type 2 diabetes mellitus with diabetic chronic kidney disease: Secondary | ICD-10-CM | POA: Diagnosis not present

## 2019-12-12 DIAGNOSIS — E1129 Type 2 diabetes mellitus with other diabetic kidney complication: Secondary | ICD-10-CM | POA: Diagnosis not present

## 2019-12-12 DIAGNOSIS — E1165 Type 2 diabetes mellitus with hyperglycemia: Secondary | ICD-10-CM | POA: Diagnosis not present

## 2019-12-15 NOTE — Progress Notes (Signed)
Remote pacemaker transmission.   

## 2019-12-18 DIAGNOSIS — E11319 Type 2 diabetes mellitus with unspecified diabetic retinopathy without macular edema: Secondary | ICD-10-CM | POA: Diagnosis not present

## 2019-12-22 DIAGNOSIS — I482 Chronic atrial fibrillation, unspecified: Secondary | ICD-10-CM | POA: Diagnosis not present

## 2019-12-22 DIAGNOSIS — I10 Essential (primary) hypertension: Secondary | ICD-10-CM | POA: Diagnosis not present

## 2019-12-22 DIAGNOSIS — Z6830 Body mass index (BMI) 30.0-30.9, adult: Secondary | ICD-10-CM | POA: Diagnosis not present

## 2019-12-22 DIAGNOSIS — E1122 Type 2 diabetes mellitus with diabetic chronic kidney disease: Secondary | ICD-10-CM | POA: Diagnosis not present

## 2019-12-22 DIAGNOSIS — E1129 Type 2 diabetes mellitus with other diabetic kidney complication: Secondary | ICD-10-CM | POA: Diagnosis not present

## 2019-12-22 DIAGNOSIS — M069 Rheumatoid arthritis, unspecified: Secondary | ICD-10-CM | POA: Diagnosis not present

## 2019-12-22 DIAGNOSIS — N3946 Mixed incontinence: Secondary | ICD-10-CM | POA: Diagnosis not present

## 2019-12-22 DIAGNOSIS — E782 Mixed hyperlipidemia: Secondary | ICD-10-CM | POA: Diagnosis not present

## 2020-03-08 DIAGNOSIS — J441 Chronic obstructive pulmonary disease with (acute) exacerbation: Secondary | ICD-10-CM | POA: Diagnosis not present

## 2020-03-08 DIAGNOSIS — Z20828 Contact with and (suspected) exposure to other viral communicable diseases: Secondary | ICD-10-CM | POA: Diagnosis not present

## 2020-03-11 ENCOUNTER — Ambulatory Visit (INDEPENDENT_AMBULATORY_CARE_PROVIDER_SITE_OTHER): Payer: Medicare Other | Admitting: *Deleted

## 2020-03-11 DIAGNOSIS — I480 Paroxysmal atrial fibrillation: Secondary | ICD-10-CM

## 2020-03-11 LAB — CUP PACEART REMOTE DEVICE CHECK
Battery Impedance: 232 Ohm
Battery Remaining Longevity: 142 mo
Battery Voltage: 2.79 V
Brady Statistic AP VP Percent: 0 %
Brady Statistic AP VS Percent: 2 %
Brady Statistic AS VP Percent: 0 %
Brady Statistic AS VS Percent: 98 %
Date Time Interrogation Session: 20210812135432
Implantable Lead Implant Date: 20061110
Implantable Lead Implant Date: 20061110
Implantable Lead Location: 753859
Implantable Lead Location: 753860
Implantable Lead Model: 5076
Implantable Lead Model: 5076
Implantable Pulse Generator Implant Date: 20170106
Lead Channel Impedance Value: 524 Ohm
Lead Channel Impedance Value: 703 Ohm
Lead Channel Pacing Threshold Amplitude: 0.5 V
Lead Channel Pacing Threshold Amplitude: 0.75 V
Lead Channel Pacing Threshold Pulse Width: 0.4 ms
Lead Channel Pacing Threshold Pulse Width: 0.4 ms
Lead Channel Setting Pacing Amplitude: 1.5 V
Lead Channel Setting Pacing Amplitude: 2 V
Lead Channel Setting Pacing Pulse Width: 0.4 ms
Lead Channel Setting Sensing Sensitivity: 5.6 mV

## 2020-03-15 NOTE — Progress Notes (Signed)
Remote pacemaker transmission.   

## 2020-04-20 DIAGNOSIS — E1122 Type 2 diabetes mellitus with diabetic chronic kidney disease: Secondary | ICD-10-CM | POA: Diagnosis not present

## 2020-04-20 DIAGNOSIS — E782 Mixed hyperlipidemia: Secondary | ICD-10-CM | POA: Diagnosis not present

## 2020-04-20 DIAGNOSIS — E1165 Type 2 diabetes mellitus with hyperglycemia: Secondary | ICD-10-CM | POA: Diagnosis not present

## 2020-04-20 DIAGNOSIS — I10 Essential (primary) hypertension: Secondary | ICD-10-CM | POA: Diagnosis not present

## 2020-04-20 DIAGNOSIS — N183 Chronic kidney disease, stage 3 unspecified: Secondary | ICD-10-CM | POA: Diagnosis not present

## 2020-04-23 DIAGNOSIS — Z1331 Encounter for screening for depression: Secondary | ICD-10-CM | POA: Diagnosis not present

## 2020-04-23 DIAGNOSIS — R4582 Worries: Secondary | ICD-10-CM | POA: Diagnosis not present

## 2020-04-23 DIAGNOSIS — Z1389 Encounter for screening for other disorder: Secondary | ICD-10-CM | POA: Diagnosis not present

## 2020-04-23 DIAGNOSIS — N3946 Mixed incontinence: Secondary | ICD-10-CM | POA: Diagnosis not present

## 2020-04-23 DIAGNOSIS — Z23 Encounter for immunization: Secondary | ICD-10-CM | POA: Diagnosis not present

## 2020-04-23 DIAGNOSIS — I10 Essential (primary) hypertension: Secondary | ICD-10-CM | POA: Diagnosis not present

## 2020-04-23 DIAGNOSIS — E1165 Type 2 diabetes mellitus with hyperglycemia: Secondary | ICD-10-CM | POA: Diagnosis not present

## 2020-04-23 DIAGNOSIS — I482 Chronic atrial fibrillation, unspecified: Secondary | ICD-10-CM | POA: Diagnosis not present

## 2020-06-10 ENCOUNTER — Ambulatory Visit (INDEPENDENT_AMBULATORY_CARE_PROVIDER_SITE_OTHER): Payer: Medicare Other

## 2020-06-10 DIAGNOSIS — I495 Sick sinus syndrome: Secondary | ICD-10-CM | POA: Diagnosis not present

## 2020-06-12 LAB — CUP PACEART REMOTE DEVICE CHECK
Battery Impedance: 257 Ohm
Battery Remaining Longevity: 138 mo
Battery Voltage: 2.8 V
Brady Statistic AP VP Percent: 0 %
Brady Statistic AP VS Percent: 2 %
Brady Statistic AS VP Percent: 0 %
Brady Statistic AS VS Percent: 98 %
Date Time Interrogation Session: 20211112123514
Implantable Lead Implant Date: 20061110
Implantable Lead Implant Date: 20061110
Implantable Lead Location: 753859
Implantable Lead Location: 753860
Implantable Lead Model: 5076
Implantable Lead Model: 5076
Implantable Pulse Generator Implant Date: 20170106
Lead Channel Impedance Value: 539 Ohm
Lead Channel Impedance Value: 717 Ohm
Lead Channel Pacing Threshold Amplitude: 0.5 V
Lead Channel Pacing Threshold Amplitude: 0.75 V
Lead Channel Pacing Threshold Pulse Width: 0.4 ms
Lead Channel Pacing Threshold Pulse Width: 0.4 ms
Lead Channel Setting Pacing Amplitude: 1.5 V
Lead Channel Setting Pacing Amplitude: 2 V
Lead Channel Setting Pacing Pulse Width: 0.4 ms
Lead Channel Setting Sensing Sensitivity: 5.6 mV

## 2020-06-14 NOTE — Progress Notes (Signed)
Remote pacemaker transmission.   

## 2020-07-30 DIAGNOSIS — Z20828 Contact with and (suspected) exposure to other viral communicable diseases: Secondary | ICD-10-CM | POA: Diagnosis not present

## 2020-08-04 ENCOUNTER — Other Ambulatory Visit: Payer: Self-pay

## 2020-08-04 ENCOUNTER — Ambulatory Visit (INDEPENDENT_AMBULATORY_CARE_PROVIDER_SITE_OTHER): Payer: Medicare Other | Admitting: Internal Medicine

## 2020-08-04 VITALS — BP 140/70 | HR 76 | Ht 65.0 in | Wt 182.0 lb

## 2020-08-04 DIAGNOSIS — I495 Sick sinus syndrome: Secondary | ICD-10-CM | POA: Diagnosis not present

## 2020-08-04 DIAGNOSIS — I480 Paroxysmal atrial fibrillation: Secondary | ICD-10-CM

## 2020-08-04 LAB — CUP PACEART INCLINIC DEVICE CHECK
Battery Impedance: 257 Ohm
Battery Remaining Longevity: 137 mo
Battery Voltage: 2.8 V
Brady Statistic AP VP Percent: 0 %
Brady Statistic AP VS Percent: 2 %
Brady Statistic AS VP Percent: 0 %
Brady Statistic AS VS Percent: 98 %
Date Time Interrogation Session: 20220105151007
Implantable Lead Implant Date: 20061110
Implantable Lead Implant Date: 20061110
Implantable Lead Location: 753859
Implantable Lead Location: 753860
Implantable Lead Model: 5076
Implantable Lead Model: 5076
Implantable Pulse Generator Implant Date: 20170106
Lead Channel Impedance Value: 509 Ohm
Lead Channel Impedance Value: 677 Ohm
Lead Channel Pacing Threshold Amplitude: 0.5 V
Lead Channel Pacing Threshold Amplitude: 0.75 V
Lead Channel Pacing Threshold Pulse Width: 0.4 ms
Lead Channel Pacing Threshold Pulse Width: 0.4 ms
Lead Channel Sensing Intrinsic Amplitude: 15.67 mV
Lead Channel Sensing Intrinsic Amplitude: 2 mV
Lead Channel Setting Pacing Amplitude: 1.5 V
Lead Channel Setting Pacing Amplitude: 2 V
Lead Channel Setting Pacing Pulse Width: 0.4 ms
Lead Channel Setting Sensing Sensitivity: 5.6 mV

## 2020-08-04 NOTE — Patient Instructions (Signed)
Medication Instructions:  Your physician recommends that you continue on your current medications as directed. Please refer to the Current Medication list given to you today.  *If you need a refill on your cardiac medications before your next appointment, please call your pharmacy*   Lab Work: NONE   If you have labs (blood work) drawn today and your tests are completely normal, you will receive your results only by: . MyChart Message (if you have MyChart) OR . A paper copy in the mail If you have any lab test that is abnormal or we need to change your treatment, we will call you to review the results.   Testing/Procedures: NONE    Follow-Up: At [facility], you and your health needs are our priority.  As part of our continuing mission to provide you with exceptional heart care, we have created designated Provider Care Teams.  These Care Teams include your primary Cardiologist (physician) and Advanced Practice Providers (APPs -  Physician Assistants and Nurse Practitioners) who all work together to provide you with the care you need, when you need it.  We recommend signing up for the patient portal called "MyChart".  Sign up information is provided on this After Visit Summary.  MyChart is used to connect with patients for Virtual Visits (Telemedicine).  Patients are able to view lab/test results, encounter notes, upcoming appointments, etc.  Non-urgent messages can be sent to your provider as well.   To learn more about what you can do with MyChart, go to [email or web address].    Your next appointment:   1 year(s)  The format for your next appointment:   In Person  Provider:   Gregg Taylor, MD   Other Instructions Thank you for choosing [facility]!    

## 2020-08-04 NOTE — Progress Notes (Signed)
HPI Tracey Koch returns today for followup. She is a pleasant 75yo woman with a h/o symptomatic bradycardia, s/p PPM, HTN, arthritis, and DM. In the interim, she has been stable. No chest pain or sob. No syncope. She notes occaisional palpitations. She has some arthritic symptoms.She notes some peripheral edema. She is still smoking a ppd.  Allergies  Allergen Reactions  . Penicillins Shortness Of Breath, Itching, Swelling and Rash    Has patient had a PCN reaction causing immediate rash, facial/tongue/throat swelling, SOB or lightheadedness with hypotension: Yes Has patient had a PCN reaction causing severe rash involving mucus membranes or skin necrosis: No Has patient had a PCN reaction that required hospitalization No Has patient had a PCN reaction occurring within the last 10 years: No If all of the above answers are "NO", then may proceed with Cephalosporin use.   Marland Kitchen Lisinopril Cough  . Macrobid [Nitrofurantoin] Hives, Diarrhea, Itching and Nausea And Vomiting  . Codeine Itching, Swelling and Rash  . Levaquin [Levofloxacin In D5w] Itching, Swelling and Other (See Comments)    Can take low dose  . Sulfa Drugs Cross Reactors Itching, Swelling and Rash     Current Outpatient Medications  Medication Sig Dispense Refill  . albuterol (VENTOLIN HFA) 108 (90 Base) MCG/ACT inhaler Inhale into the lungs every 6 (six) hours as needed for wheezing or shortness of breath.    Marland Kitchen apixaban (ELIQUIS) 5 MG TABS tablet Take 1 tablet (5 mg total) by mouth 2 (two) times daily. 60 tablet 11  . fexofenadine (ALLEGRA) 180 MG tablet Take 180 mg by mouth daily.    Marland Kitchen HYDROcodone-acetaminophen (NORCO) 7.5-325 MG tablet Take 1 tablet by mouth every 6 (six) hours as needed for moderate pain.     Marland Kitchen HYDROcodone-acetaminophen (NORCO) 7.5-325 MG tablet Take 1 tablet by mouth 3 (three) times daily.     Marland Kitchen losartan (COZAAR) 50 MG tablet Take 1 tablet (50 mg total) by mouth daily. 90 tablet 3  . metFORMIN  (GLUCOPHAGE) 500 MG tablet Take 1,000 mg by mouth 2 (two) times daily with a meal.     . Omega-3 Fatty Acids (OMEGA-3 FISH OIL PO) Take 1 capsule by mouth 2 (two) times daily. OMEGA XL    . simvastatin (ZOCOR) 20 MG tablet Take 20 mg by mouth every evening.    . solifenacin (VESICARE) 5 MG tablet Take 5 mg by mouth daily.    . traZODone (DESYREL) 150 MG tablet Take 150 mg by mouth at bedtime.     No current facility-administered medications for this visit.     Past Medical History:  Diagnosis Date  . Anemia    takes Folic Acid daily  . Arthritis    takes Plaquenil daily and Methotrexate 2 times a week  . Cough    takes Allegra daily  . Depression    takes Prozac at bedtime  . Diabetes mellitus without complication (HCC)    takes Metformin daily  . Family history of anesthesia complication    sister post op N/V  . History of blood transfusion   . History of UTI   . Hyperlipidemia    takes Simvastatin nightly  . Joint pain   . Joint swelling   . Pacemaker   . Seasonal allergies    takes Allegra daily  . Sinoatrial node dysfunction (HCC)    has ICD    ROS:   All systems reviewed and negative except as noted in the HPI.   Past  Surgical History:  Procedure Laterality Date  . ABDOMINAL HYSTERECTOMY    . CHOLECYSTECTOMY    . COLONOSCOPY    . EP IMPLANTABLE DEVICE N/A 08/06/2015   Procedure: PPM Generator Changeout;  Surgeon: Evans Lance, MD;  Location: [place] CV LAB;  Service: Cardiovascular;  Laterality: N/A;  . INSERT / REPLACE / REMOVE PACEMAKER    . right knee replacement    . TOTAL KNEE ARTHROPLASTY Left 05/06/2013   Dr Durward Fortes  . TOTAL KNEE ARTHROPLASTY Left 05/06/2013   Procedure: TOTAL KNEE ARTHROPLASTY;  Surgeon: Garald Balding, MD;  Location: [place];  Service: Orthopedics;  Laterality: Left;     Family History  Problem Relation Age of Onset  . COPD Mother   . Stroke Father   . Heart attack Father   . Heart failure Sister   . Diabetes Sister    . CAD Brother   . Diabetes Brother   . Multiple sclerosis Sister   . CAD Brother   . Diabetes Brother   . Renal cancer Brother      Social History   Socioeconomic History  . Marital status: Married    Spouse name: Not on file  . Number of children: Not on file  . Years of education: Not on file  . Highest education level: Not on file  Occupational History  . Not on file  Tobacco Use  . Smoking status: Current Every Day Smoker    Packs/day: 1.00    Years: 52.00    Pack years: 52.00    Types: Cigarettes    Start date: 03/01/1962    Last attempt to quit: 02/27/2014    Years since quitting: 6.4  . Smokeless tobacco: Never Used  Vaping Use  . Vaping Use: Former  . Start date: 02/23/2016  . Quit date: 03/25/2017  Substance and Sexual Activity  . Alcohol use: No    Alcohol/week: 0.0 standard drinks  . Drug use: No  . Sexual activity: Not Currently    Birth control/protection: Surgical  Other Topics Concern  . Not on file  Social History Narrative  . Not on file   Social Determinants of Health   Financial Resource Strain: Not on file  Food Insecurity: Not on file  Transportation Needs: Not on file  Physical Activity: Not on file  Stress: Not on file  Social Connections: Not on file  Intimate Partner Violence: Not on file     BP 140/70   Pulse 76   Ht 5\' 5"  (1.651 m)   Wt 182 lb (82.6 kg)   SpO2 96%   BMI 30.29 kg/m   Physical Exam:  Well appearing 75 yo woman, NAD HEENT: Unremarkable Neck:  No JVD, no thyromegally Lymphatics:  No adenopathy Back:  No CVA tenderness Lungs:  Clear with no wheezes HEART:  Regular rate rhythm, no murmurs, no rubs, no clicks Abd:  soft, positive bowel sounds, no organomegally, no rebound, no guarding Ext:  2 plus pulses, no edema, no cyanosis, no clubbing Skin:  No rashes no nodules Neuro:  CN II through XII intact, motor grossly intact  DEVICE  Normal device function.  See PaceArt for details.   Assess/Plan: 1.  Tobacco abuse - I encouraged the patient to stop smoking. She states that she stopped cold [place] in the past. 2. HTN - her bp is up a bit today. She states that it is controlled at home. I encouraged her to reduce her salt intake. 3. PPM - her Medtronic  DDD PM is working normally. We will recheck in several months. 4. coags - she has not had any significant bleeding on her OAC. We will follow.  Lewayne Bunting, MD

## 2020-08-19 DIAGNOSIS — E1122 Type 2 diabetes mellitus with diabetic chronic kidney disease: Secondary | ICD-10-CM | POA: Diagnosis not present

## 2020-08-19 DIAGNOSIS — E7849 Other hyperlipidemia: Secondary | ICD-10-CM | POA: Diagnosis not present

## 2020-08-19 DIAGNOSIS — N183 Chronic kidney disease, stage 3 unspecified: Secondary | ICD-10-CM | POA: Diagnosis not present

## 2020-08-19 DIAGNOSIS — E1165 Type 2 diabetes mellitus with hyperglycemia: Secondary | ICD-10-CM | POA: Diagnosis not present

## 2020-08-19 DIAGNOSIS — E782 Mixed hyperlipidemia: Secondary | ICD-10-CM | POA: Diagnosis not present

## 2020-08-19 DIAGNOSIS — I10 Essential (primary) hypertension: Secondary | ICD-10-CM | POA: Diagnosis not present

## 2020-08-23 DIAGNOSIS — E1165 Type 2 diabetes mellitus with hyperglycemia: Secondary | ICD-10-CM | POA: Diagnosis not present

## 2020-08-23 DIAGNOSIS — Z0001 Encounter for general adult medical examination with abnormal findings: Secondary | ICD-10-CM | POA: Diagnosis not present

## 2020-08-23 DIAGNOSIS — I482 Chronic atrial fibrillation, unspecified: Secondary | ICD-10-CM | POA: Diagnosis not present

## 2020-08-23 DIAGNOSIS — N3946 Mixed incontinence: Secondary | ICD-10-CM | POA: Diagnosis not present

## 2020-08-23 DIAGNOSIS — E7849 Other hyperlipidemia: Secondary | ICD-10-CM | POA: Diagnosis not present

## 2020-08-23 DIAGNOSIS — R4582 Worries: Secondary | ICD-10-CM | POA: Diagnosis not present

## 2020-08-23 DIAGNOSIS — S40019A Contusion of unspecified shoulder, initial encounter: Secondary | ICD-10-CM | POA: Diagnosis not present

## 2020-08-23 DIAGNOSIS — I10 Essential (primary) hypertension: Secondary | ICD-10-CM | POA: Diagnosis not present

## 2020-08-28 DIAGNOSIS — E782 Mixed hyperlipidemia: Secondary | ICD-10-CM | POA: Diagnosis not present

## 2020-08-28 DIAGNOSIS — I10 Essential (primary) hypertension: Secondary | ICD-10-CM | POA: Diagnosis not present

## 2020-08-28 DIAGNOSIS — E1165 Type 2 diabetes mellitus with hyperglycemia: Secondary | ICD-10-CM | POA: Diagnosis not present

## 2020-09-09 ENCOUNTER — Ambulatory Visit (INDEPENDENT_AMBULATORY_CARE_PROVIDER_SITE_OTHER): Payer: Medicare Other

## 2020-09-09 DIAGNOSIS — I495 Sick sinus syndrome: Secondary | ICD-10-CM | POA: Diagnosis not present

## 2020-09-09 LAB — CUP PACEART REMOTE DEVICE CHECK
Battery Impedance: 257 Ohm
Battery Remaining Longevity: 138 mo
Battery Voltage: 2.79 V
Brady Statistic AP VP Percent: 0 %
Brady Statistic AP VS Percent: 2 %
Brady Statistic AS VP Percent: 0 %
Brady Statistic AS VS Percent: 98 %
Date Time Interrogation Session: 20220210110714
Implantable Lead Implant Date: 20061110
Implantable Lead Implant Date: 20061110
Implantable Lead Location: 753859
Implantable Lead Location: 753860
Implantable Lead Model: 5076
Implantable Lead Model: 5076
Implantable Pulse Generator Implant Date: 20170106
Lead Channel Impedance Value: 573 Ohm
Lead Channel Impedance Value: 627 Ohm
Lead Channel Pacing Threshold Amplitude: 0.5 V
Lead Channel Pacing Threshold Amplitude: 0.875 V
Lead Channel Pacing Threshold Pulse Width: 0.4 ms
Lead Channel Pacing Threshold Pulse Width: 0.4 ms
Lead Channel Setting Pacing Amplitude: 1.5 V
Lead Channel Setting Pacing Amplitude: 2 V
Lead Channel Setting Pacing Pulse Width: 0.4 ms
Lead Channel Setting Sensing Sensitivity: 5.6 mV

## 2020-09-16 NOTE — Progress Notes (Signed)
Remote pacemaker transmission.   

## 2020-09-27 DIAGNOSIS — I10 Essential (primary) hypertension: Secondary | ICD-10-CM | POA: Diagnosis not present

## 2020-09-27 DIAGNOSIS — I482 Chronic atrial fibrillation, unspecified: Secondary | ICD-10-CM | POA: Diagnosis not present

## 2020-09-27 DIAGNOSIS — E782 Mixed hyperlipidemia: Secondary | ICD-10-CM | POA: Diagnosis not present

## 2020-09-27 DIAGNOSIS — E1165 Type 2 diabetes mellitus with hyperglycemia: Secondary | ICD-10-CM | POA: Diagnosis not present

## 2020-11-08 DIAGNOSIS — R509 Fever, unspecified: Secondary | ICD-10-CM | POA: Diagnosis not present

## 2020-11-08 DIAGNOSIS — R350 Frequency of micturition: Secondary | ICD-10-CM | POA: Diagnosis not present

## 2020-11-08 DIAGNOSIS — Z20828 Contact with and (suspected) exposure to other viral communicable diseases: Secondary | ICD-10-CM | POA: Diagnosis not present

## 2020-11-08 DIAGNOSIS — R918 Other nonspecific abnormal finding of lung field: Secondary | ICD-10-CM | POA: Diagnosis not present

## 2020-11-08 DIAGNOSIS — F17200 Nicotine dependence, unspecified, uncomplicated: Secondary | ICD-10-CM | POA: Diagnosis not present

## 2020-11-08 DIAGNOSIS — R059 Cough, unspecified: Secondary | ICD-10-CM | POA: Diagnosis not present

## 2020-12-09 ENCOUNTER — Ambulatory Visit (INDEPENDENT_AMBULATORY_CARE_PROVIDER_SITE_OTHER): Payer: Medicare Other

## 2020-12-09 DIAGNOSIS — I480 Paroxysmal atrial fibrillation: Secondary | ICD-10-CM | POA: Diagnosis not present

## 2020-12-09 LAB — CUP PACEART REMOTE DEVICE CHECK
Battery Impedance: 281 Ohm
Battery Remaining Longevity: 134 mo
Battery Voltage: 2.79 V
Brady Statistic AP VP Percent: 0 %
Brady Statistic AP VS Percent: 5 %
Brady Statistic AS VP Percent: 0 %
Brady Statistic AS VS Percent: 95 %
Date Time Interrogation Session: 20220512100331
Implantable Lead Implant Date: 20061110
Implantable Lead Implant Date: 20061110
Implantable Lead Location: 753859
Implantable Lead Location: 753860
Implantable Lead Model: 5076
Implantable Lead Model: 5076
Implantable Pulse Generator Implant Date: 20170106
Lead Channel Impedance Value: 530 Ohm
Lead Channel Impedance Value: 603 Ohm
Lead Channel Pacing Threshold Amplitude: 0.5 V
Lead Channel Pacing Threshold Amplitude: 0.75 V
Lead Channel Pacing Threshold Pulse Width: 0.4 ms
Lead Channel Pacing Threshold Pulse Width: 0.4 ms
Lead Channel Setting Pacing Amplitude: 1.5 V
Lead Channel Setting Pacing Amplitude: 2 V
Lead Channel Setting Pacing Pulse Width: 0.4 ms
Lead Channel Setting Sensing Sensitivity: 5.6 mV

## 2020-12-15 DIAGNOSIS — E1122 Type 2 diabetes mellitus with diabetic chronic kidney disease: Secondary | ICD-10-CM | POA: Diagnosis not present

## 2020-12-15 DIAGNOSIS — E782 Mixed hyperlipidemia: Secondary | ICD-10-CM | POA: Diagnosis not present

## 2020-12-15 DIAGNOSIS — N183 Chronic kidney disease, stage 3 unspecified: Secondary | ICD-10-CM | POA: Diagnosis not present

## 2020-12-15 DIAGNOSIS — E7849 Other hyperlipidemia: Secondary | ICD-10-CM | POA: Diagnosis not present

## 2020-12-15 DIAGNOSIS — I10 Essential (primary) hypertension: Secondary | ICD-10-CM | POA: Diagnosis not present

## 2020-12-21 DIAGNOSIS — E7849 Other hyperlipidemia: Secondary | ICD-10-CM | POA: Diagnosis not present

## 2020-12-21 DIAGNOSIS — N183 Chronic kidney disease, stage 3 unspecified: Secondary | ICD-10-CM | POA: Diagnosis not present

## 2020-12-21 DIAGNOSIS — M069 Rheumatoid arthritis, unspecified: Secondary | ICD-10-CM | POA: Diagnosis not present

## 2020-12-21 DIAGNOSIS — N3946 Mixed incontinence: Secondary | ICD-10-CM | POA: Diagnosis not present

## 2020-12-21 DIAGNOSIS — I10 Essential (primary) hypertension: Secondary | ICD-10-CM | POA: Diagnosis not present

## 2020-12-21 DIAGNOSIS — S40019A Contusion of unspecified shoulder, initial encounter: Secondary | ICD-10-CM | POA: Diagnosis not present

## 2020-12-21 DIAGNOSIS — Z23 Encounter for immunization: Secondary | ICD-10-CM | POA: Diagnosis not present

## 2020-12-21 DIAGNOSIS — F5101 Primary insomnia: Secondary | ICD-10-CM | POA: Diagnosis not present

## 2020-12-21 DIAGNOSIS — E782 Mixed hyperlipidemia: Secondary | ICD-10-CM | POA: Diagnosis not present

## 2020-12-21 DIAGNOSIS — E6609 Other obesity due to excess calories: Secondary | ICD-10-CM | POA: Diagnosis not present

## 2020-12-21 DIAGNOSIS — Z6831 Body mass index (BMI) 31.0-31.9, adult: Secondary | ICD-10-CM | POA: Diagnosis not present

## 2020-12-21 DIAGNOSIS — E1165 Type 2 diabetes mellitus with hyperglycemia: Secondary | ICD-10-CM | POA: Diagnosis not present

## 2020-12-21 DIAGNOSIS — I482 Chronic atrial fibrillation, unspecified: Secondary | ICD-10-CM | POA: Diagnosis not present

## 2020-12-27 DIAGNOSIS — I482 Chronic atrial fibrillation, unspecified: Secondary | ICD-10-CM | POA: Diagnosis not present

## 2020-12-27 DIAGNOSIS — E1165 Type 2 diabetes mellitus with hyperglycemia: Secondary | ICD-10-CM | POA: Diagnosis not present

## 2020-12-27 DIAGNOSIS — E782 Mixed hyperlipidemia: Secondary | ICD-10-CM | POA: Diagnosis not present

## 2020-12-27 DIAGNOSIS — I10 Essential (primary) hypertension: Secondary | ICD-10-CM | POA: Diagnosis not present

## 2020-12-28 DIAGNOSIS — R911 Solitary pulmonary nodule: Secondary | ICD-10-CM | POA: Diagnosis not present

## 2020-12-28 DIAGNOSIS — I2510 Atherosclerotic heart disease of native coronary artery without angina pectoris: Secondary | ICD-10-CM | POA: Diagnosis not present

## 2020-12-28 DIAGNOSIS — J9811 Atelectasis: Secondary | ICD-10-CM | POA: Diagnosis not present

## 2020-12-28 DIAGNOSIS — J90 Pleural effusion, not elsewhere classified: Secondary | ICD-10-CM | POA: Diagnosis not present

## 2020-12-28 DIAGNOSIS — J479 Bronchiectasis, uncomplicated: Secondary | ICD-10-CM | POA: Diagnosis not present

## 2020-12-28 DIAGNOSIS — N6310 Unspecified lump in the right breast, unspecified quadrant: Secondary | ICD-10-CM | POA: Diagnosis not present

## 2020-12-31 NOTE — Progress Notes (Signed)
Remote pacemaker transmission.   

## 2021-02-27 DIAGNOSIS — I10 Essential (primary) hypertension: Secondary | ICD-10-CM | POA: Diagnosis not present

## 2021-02-27 DIAGNOSIS — E1165 Type 2 diabetes mellitus with hyperglycemia: Secondary | ICD-10-CM | POA: Diagnosis not present

## 2021-02-27 DIAGNOSIS — I482 Chronic atrial fibrillation, unspecified: Secondary | ICD-10-CM | POA: Diagnosis not present

## 2021-02-27 DIAGNOSIS — E782 Mixed hyperlipidemia: Secondary | ICD-10-CM | POA: Diagnosis not present

## 2021-03-10 ENCOUNTER — Ambulatory Visit (INDEPENDENT_AMBULATORY_CARE_PROVIDER_SITE_OTHER): Payer: Medicare Other

## 2021-03-10 DIAGNOSIS — I495 Sick sinus syndrome: Secondary | ICD-10-CM | POA: Diagnosis not present

## 2021-03-13 LAB — CUP PACEART REMOTE DEVICE CHECK
Battery Impedance: 305 Ohm
Battery Remaining Longevity: 130 mo
Battery Voltage: 2.79 V
Brady Statistic AP VP Percent: 0 %
Brady Statistic AP VS Percent: 4 %
Brady Statistic AS VP Percent: 0 %
Brady Statistic AS VS Percent: 96 %
Date Time Interrogation Session: 20220811221634
Implantable Lead Implant Date: 20061110
Implantable Lead Implant Date: 20061110
Implantable Lead Location: 753859
Implantable Lead Location: 753860
Implantable Lead Model: 5076
Implantable Lead Model: 5076
Implantable Pulse Generator Implant Date: 20170106
Lead Channel Impedance Value: 471 Ohm
Lead Channel Impedance Value: 599 Ohm
Lead Channel Pacing Threshold Amplitude: 0.5 V
Lead Channel Pacing Threshold Amplitude: 0.625 V
Lead Channel Pacing Threshold Pulse Width: 0.4 ms
Lead Channel Pacing Threshold Pulse Width: 0.4 ms
Lead Channel Setting Pacing Amplitude: 1.5 V
Lead Channel Setting Pacing Amplitude: 2 V
Lead Channel Setting Pacing Pulse Width: 0.4 ms
Lead Channel Setting Sensing Sensitivity: 5.6 mV

## 2021-04-01 NOTE — Progress Notes (Signed)
Remote pacemaker transmission.   

## 2021-04-15 DIAGNOSIS — I10 Essential (primary) hypertension: Secondary | ICD-10-CM | POA: Diagnosis not present

## 2021-04-15 DIAGNOSIS — E782 Mixed hyperlipidemia: Secondary | ICD-10-CM | POA: Diagnosis not present

## 2021-04-15 DIAGNOSIS — E1122 Type 2 diabetes mellitus with diabetic chronic kidney disease: Secondary | ICD-10-CM | POA: Diagnosis not present

## 2021-04-15 DIAGNOSIS — N183 Chronic kidney disease, stage 3 unspecified: Secondary | ICD-10-CM | POA: Diagnosis not present

## 2021-04-15 DIAGNOSIS — Z20828 Contact with and (suspected) exposure to other viral communicable diseases: Secondary | ICD-10-CM | POA: Diagnosis not present

## 2021-04-15 DIAGNOSIS — E7849 Other hyperlipidemia: Secondary | ICD-10-CM | POA: Diagnosis not present

## 2021-04-20 DIAGNOSIS — N183 Chronic kidney disease, stage 3 unspecified: Secondary | ICD-10-CM | POA: Diagnosis not present

## 2021-04-20 DIAGNOSIS — I10 Essential (primary) hypertension: Secondary | ICD-10-CM | POA: Diagnosis not present

## 2021-04-20 DIAGNOSIS — N3946 Mixed incontinence: Secondary | ICD-10-CM | POA: Diagnosis not present

## 2021-04-20 DIAGNOSIS — E7849 Other hyperlipidemia: Secondary | ICD-10-CM | POA: Diagnosis not present

## 2021-04-20 DIAGNOSIS — Z23 Encounter for immunization: Secondary | ICD-10-CM | POA: Diagnosis not present

## 2021-04-20 DIAGNOSIS — E1165 Type 2 diabetes mellitus with hyperglycemia: Secondary | ICD-10-CM | POA: Diagnosis not present

## 2021-04-20 DIAGNOSIS — R4582 Worries: Secondary | ICD-10-CM | POA: Diagnosis not present

## 2021-04-20 DIAGNOSIS — I482 Chronic atrial fibrillation, unspecified: Secondary | ICD-10-CM | POA: Diagnosis not present

## 2021-08-17 DIAGNOSIS — Z6831 Body mass index (BMI) 31.0-31.9, adult: Secondary | ICD-10-CM | POA: Diagnosis not present

## 2021-08-17 DIAGNOSIS — E7849 Other hyperlipidemia: Secondary | ICD-10-CM | POA: Diagnosis not present

## 2021-08-17 DIAGNOSIS — I10 Essential (primary) hypertension: Secondary | ICD-10-CM | POA: Diagnosis not present

## 2021-08-17 DIAGNOSIS — R4582 Worries: Secondary | ICD-10-CM | POA: Diagnosis not present

## 2021-08-17 DIAGNOSIS — E6609 Other obesity due to excess calories: Secondary | ICD-10-CM | POA: Diagnosis not present

## 2021-08-17 DIAGNOSIS — N183 Chronic kidney disease, stage 3 unspecified: Secondary | ICD-10-CM | POA: Diagnosis not present

## 2021-08-17 DIAGNOSIS — F5101 Primary insomnia: Secondary | ICD-10-CM | POA: Diagnosis not present

## 2021-08-17 DIAGNOSIS — R69 Illness, unspecified: Secondary | ICD-10-CM | POA: Diagnosis not present

## 2021-08-17 DIAGNOSIS — E782 Mixed hyperlipidemia: Secondary | ICD-10-CM | POA: Diagnosis not present

## 2021-08-17 DIAGNOSIS — I482 Chronic atrial fibrillation, unspecified: Secondary | ICD-10-CM | POA: Diagnosis not present

## 2021-08-17 DIAGNOSIS — E1165 Type 2 diabetes mellitus with hyperglycemia: Secondary | ICD-10-CM | POA: Diagnosis not present

## 2021-08-17 DIAGNOSIS — N3946 Mixed incontinence: Secondary | ICD-10-CM | POA: Diagnosis not present

## 2021-08-17 DIAGNOSIS — M069 Rheumatoid arthritis, unspecified: Secondary | ICD-10-CM | POA: Diagnosis not present

## 2021-08-30 ENCOUNTER — Other Ambulatory Visit: Payer: Self-pay

## 2021-08-30 ENCOUNTER — Ambulatory Visit (INDEPENDENT_AMBULATORY_CARE_PROVIDER_SITE_OTHER): Payer: Medicare HMO | Admitting: Internal Medicine

## 2021-08-30 ENCOUNTER — Encounter: Payer: Self-pay | Admitting: Internal Medicine

## 2021-08-30 VITALS — BP 146/58 | HR 80 | Ht 64.0 in | Wt 189.0 lb

## 2021-08-30 DIAGNOSIS — I495 Sick sinus syndrome: Secondary | ICD-10-CM

## 2021-08-30 DIAGNOSIS — I480 Paroxysmal atrial fibrillation: Secondary | ICD-10-CM

## 2021-08-30 DIAGNOSIS — I350 Nonrheumatic aortic (valve) stenosis: Secondary | ICD-10-CM

## 2021-08-30 LAB — CUP PACEART INCLINIC DEVICE CHECK
Battery Impedance: 354 Ohm
Battery Remaining Longevity: 124 mo
Battery Voltage: 2.79 V
Brady Statistic AP VP Percent: 0 %
Brady Statistic AP VS Percent: 4 %
Brady Statistic AS VP Percent: 0 %
Brady Statistic AS VS Percent: 96 %
Date Time Interrogation Session: 20230131135149
Implantable Lead Implant Date: 20061110
Implantable Lead Implant Date: 20061110
Implantable Lead Location: 753859
Implantable Lead Location: 753860
Implantable Lead Model: 5076
Implantable Lead Model: 5076
Implantable Pulse Generator Implant Date: 20170106
Lead Channel Impedance Value: 460 Ohm
Lead Channel Impedance Value: 606 Ohm
Lead Channel Pacing Threshold Amplitude: 0.5 V
Lead Channel Pacing Threshold Amplitude: 0.5 V
Lead Channel Pacing Threshold Amplitude: 0.75 V
Lead Channel Pacing Threshold Amplitude: 0.75 V
Lead Channel Pacing Threshold Pulse Width: 0.4 ms
Lead Channel Pacing Threshold Pulse Width: 0.4 ms
Lead Channel Pacing Threshold Pulse Width: 0.4 ms
Lead Channel Pacing Threshold Pulse Width: 0.4 ms
Lead Channel Sensing Intrinsic Amplitude: 1.4 mV
Lead Channel Sensing Intrinsic Amplitude: 22.4 mV
Lead Channel Setting Pacing Amplitude: 1.5 V
Lead Channel Setting Pacing Amplitude: 2 V
Lead Channel Setting Pacing Pulse Width: 0.4 ms
Lead Channel Setting Sensing Sensitivity: 5.6 mV

## 2021-08-30 NOTE — Patient Instructions (Signed)
Medication Instructions:  Your physician recommends that you continue on your current medications as directed. Please refer to the Current Medication list given to you today.  *If you need a refill on your cardiac medications before your next appointment, please call your pharmacy*   If you have labs (blood work) drawn today and your tests are completely normal, you will receive your results only by: MyChart Message (if you have MyChart) OR A paper copy in the mail If you have any lab test that is abnormal or we need to change your treatment, we will call you to review the results.   Testing/Procedures: Your physician has requested that you have an echocardiogram. Echocardiography is a painless test that uses sound waves to create images of your heart. It provides your doctor with information about the size and shape of your heart and how well your hearts chambers and valves are working. This procedure takes approximately one hour. There are no restrictions for this procedure.    Follow-Up: At [facility], you and your health needs are our priority.  As part of our continuing mission to provide you with exceptional heart care, we have created designated Provider Care Teams.  These Care Teams include your primary Cardiologist (physician) and Advanced Practice Providers (APPs -  Physician Assistants and Nurse Practitioners) who all work together to provide you with the care you need, when you need it.  We recommend signing up for the patient portal called "MyChart".  Sign up information is provided on this After Visit Summary.  MyChart is used to connect with patients for Virtual Visits (Telemedicine).  Patients are able to view lab/test results, encounter notes, upcoming appointments, etc.  Non-urgent messages can be sent to your provider as well.   To learn more about what you can do with MyChart, go to [email or web address].    Your next appointment:   1 year(s)  The format for your  next appointment:   In Person  Provider:   Lewayne BuntingGregg Taylor, MD    Other Instructions Thank you for choosing [facility]!

## 2021-08-30 NOTE — Progress Notes (Signed)
HPI Tracey Koch returns today for followup. She is a pleasant 76 yo woman with a h/o symptomatic bradycardia, s/p PPM, HTN, arthritis, and DM. In the interim, she has been stable. No chest pain or sob. No syncope. She notes occaisional palpitations. She has some arthritic symptoms. She notes some peripheral edema. She claims to have stopped smoking 8 months ago. She has claudication.  Allergies  Allergen Reactions   Penicillins Shortness Of Breath, Itching, Swelling and Rash    Has patient had a PCN reaction causing immediate rash, facial/tongue/throat swelling, SOB or lightheadedness with hypotension: Yes Has patient had a PCN reaction causing severe rash involving mucus membranes or skin necrosis: No Has patient had a PCN reaction that required hospitalization No Has patient had a PCN reaction occurring within the last 10 years: No If all of the above answers are "NO", then may proceed with Cephalosporin use.    Lisinopril Cough   Macrobid [Nitrofurantoin] Hives, Diarrhea, Itching and Nausea And Vomiting   Codeine Itching, Swelling and Rash   Levaquin [Levofloxacin In D5w] Itching, Swelling and Other (See Comments)    Can take low dose   Sulfa Drugs Cross Reactors Itching, Swelling and Rash     Current Outpatient Medications  Medication Sig Dispense Refill   albuterol (VENTOLIN HFA) 108 (90 Base) MCG/ACT inhaler Inhale into the lungs every 6 (six) hours as needed for wheezing or shortness of breath.     apixaban (ELIQUIS) 5 MG TABS tablet Take 1 tablet (5 mg total) by mouth 2 (two) times daily. 60 tablet 11   fexofenadine (ALLEGRA) 180 MG tablet Take 180 mg by mouth daily.     HYDROcodone-acetaminophen (NORCO) 7.5-325 MG tablet Take 1 tablet by mouth every 6 (six) hours as needed for moderate pain.      HYDROcodone-acetaminophen (NORCO) 7.5-325 MG tablet Take 1 tablet by mouth 3 (three) times daily.      losartan (COZAAR) 50 MG tablet Take 1 tablet (50 mg total) by mouth daily.  90 tablet 3   metFORMIN (GLUCOPHAGE) 500 MG tablet Take 1,000 mg by mouth 2 (two) times daily with a meal.      Omega-3 Fatty Acids (OMEGA-3 FISH OIL PO) Take 1 capsule by mouth 2 (two) times daily. OMEGA XL     simvastatin (ZOCOR) 20 MG tablet Take 20 mg by mouth every evening.     solifenacin (VESICARE) 5 MG tablet Take 5 mg by mouth daily.     traZODone (DESYREL) 150 MG tablet Take 150 mg by mouth at bedtime.     No current facility-administered medications for this visit.     Past Medical History:  Diagnosis Date   Anemia    takes Folic Acid daily   Arthritis    takes Plaquenil daily and Methotrexate 2 times a week   Cough    takes Allegra daily   Depression    takes Prozac at bedtime   Diabetes mellitus without complication (HCC)    takes Metformin daily   Family history of anesthesia complication    sister post op N/V   History of blood transfusion    History of UTI    Hyperlipidemia    takes Simvastatin nightly   Joint pain    Joint swelling    Pacemaker    Seasonal allergies    takes Allegra daily   Sinoatrial node dysfunction (HCC)    has ICD    ROS:   All systems reviewed and negative except  as noted in the HPI.   Past Surgical History:  Procedure Laterality Date   ABDOMINAL HYSTERECTOMY     CHOLECYSTECTOMY     COLONOSCOPY     EP IMPLANTABLE DEVICE N/A 08/06/2015   Procedure: PPM Generator Changeout;  Surgeon: Marinus Maw, MD;  Location: [facility];  Service: Cardiovascular;  Laterality: N/A;   INSERT / REPLACE / REMOVE PACEMAKER     right knee replacement     TOTAL KNEE ARTHROPLASTY Left 05/06/2013   Dr Cleophas Dunker   TOTAL KNEE ARTHROPLASTY Left 05/06/2013   Procedure: TOTAL KNEE ARTHROPLASTY;  Surgeon: [name], MD;  Location: [facility] OR;  Service: Orthopedics;  Laterality: Left;     Family History  Problem Relation Age of Onset   COPD Mother    Stroke Father    Heart attack Father    Heart failure Sister    Diabetes Sister     CAD Brother    Diabetes Brother    Multiple sclerosis Sister    CAD Brother    Diabetes Brother    Renal cancer Brother      Social History   Socioeconomic History   Marital status: Married    Spouse name: Not on file   Number of children: Not on file   Years of education: Not on file   Highest education level: Not on file  Occupational History   Not on file  Tobacco Use   Smoking status: Former    Packs/day: 1.00    Years: 52.00    Pack years: 52.00    Types: Cigarettes    Start date: 03/01/1962    Quit date: 11/28/2020    Years since quitting: 0.7   Smokeless tobacco: Never  Vaping Use   Vaping Use: Never used  Substance and Sexual Activity   Alcohol use: No    Alcohol/week: 0.0 standard drinks   Drug use: No   Sexual activity: Not Currently    Birth control/protection: Surgical  Other Topics Concern   Not on file  Social History Narrative   Not on file   Social Determinants of Health   Financial Resource Strain: Not on file  Food Insecurity: Not on file  Transportation Needs: Not on file  Physical Activity: Not on file  Stress: Not on file  Social Connections: Not on file  Intimate Partner Violence: Not on file     BP (!) 146/58    Pulse 80    Ht  (1.626 m)    Wt 189 lb (85.7 kg)    SpO2 99%    BMI 32.44 kg/m   Physical Exam:  Well appearing NAD HEENT: Unremarkable Neck:  No JVD, no thyromegally Lymphatics:  No adenopathy Back:  No CVA tenderness Lungs:  Clear HEART:  Regular rate rhythm, 3/6 AS murmurs, no rubs, no clicks Abd:  soft, positive bowel sounds, no organomegally, no rebound, no guarding Ext:  2 plus pulses, no edema, no cyanosis, no clubbing Skin:  No rashes no nodules Neuro:  CN II through XII intact, motor grossly intact  EKG - NSR with NSSTT changes  DEVICE  Normal device function.  See PaceArt for details.   Assess/Plan:  1. Tobacco abuse - I encouraged the patient to remain off of cigarettes.  2. HTN - her bp is up a  bit today. She states that it is controlled at home. I encouraged her to reduce her salt intake. 3. PPM - her Medtronic DDD PM is working normally.  We will recheck in several months. 4. coags - she has not had any significant bleeding on her OAC. We will follow. 5. Murmur - I suspect she has significant AS. Await 2D echo.   Lewayne Bunting, MD

## 2021-09-08 ENCOUNTER — Ambulatory Visit (INDEPENDENT_AMBULATORY_CARE_PROVIDER_SITE_OTHER): Payer: Medicare HMO

## 2021-09-08 DIAGNOSIS — I495 Sick sinus syndrome: Secondary | ICD-10-CM

## 2021-09-10 LAB — CUP PACEART REMOTE DEVICE CHECK
Battery Impedance: 355 Ohm
Battery Remaining Longevity: 124 mo
Battery Voltage: 2.79 V
Brady Statistic AP VP Percent: 0 %
Brady Statistic AP VS Percent: 2 %
Brady Statistic AS VP Percent: 0 %
Brady Statistic AS VS Percent: 98 %
Date Time Interrogation Session: 20230210111337
Implantable Lead Implant Date: 20061110
Implantable Lead Implant Date: 20061110
Implantable Lead Location: 753859
Implantable Lead Location: 753860
Implantable Lead Model: 5076
Implantable Lead Model: 5076
Implantable Pulse Generator Implant Date: 20170106
Lead Channel Impedance Value: 453 Ohm
Lead Channel Impedance Value: 627 Ohm
Lead Channel Pacing Threshold Amplitude: 0.5 V
Lead Channel Pacing Threshold Amplitude: 0.625 V
Lead Channel Pacing Threshold Pulse Width: 0.4 ms
Lead Channel Pacing Threshold Pulse Width: 0.4 ms
Lead Channel Setting Pacing Amplitude: 1.5 V
Lead Channel Setting Pacing Amplitude: 2 V
Lead Channel Setting Pacing Pulse Width: 0.4 ms
Lead Channel Setting Sensing Sensitivity: 5.6 mV

## 2021-09-13 NOTE — Progress Notes (Signed)
Remote pacemaker transmission.   

## 2021-09-20 ENCOUNTER — Ambulatory Visit (INDEPENDENT_AMBULATORY_CARE_PROVIDER_SITE_OTHER): Payer: Medicare HMO

## 2021-09-20 DIAGNOSIS — I350 Nonrheumatic aortic (valve) stenosis: Secondary | ICD-10-CM

## 2021-09-20 LAB — ECHOCARDIOGRAM COMPLETE
AR max vel: 1.57 cm2
AV Area VTI: 1.5 cm2
AV Area mean vel: 1.39 cm2
AV Mean grad: 16 mmHg
AV Peak grad: 24.4 mmHg
Ao pk vel: 2.47 m/s
Area-P 1/2: 2.29 cm2
Calc EF: 54.7 %
S' Lateral: 3.53 cm
Single Plane A2C EF: 54.3 %
Single Plane A4C EF: 54.6 %

## 2021-09-22 ENCOUNTER — Ambulatory Visit (HOSPITAL_COMMUNITY): Payer: Medicare HMO

## 2021-11-09 DIAGNOSIS — E7849 Other hyperlipidemia: Secondary | ICD-10-CM | POA: Diagnosis not present

## 2021-11-09 DIAGNOSIS — N183 Chronic kidney disease, stage 3 unspecified: Secondary | ICD-10-CM | POA: Diagnosis not present

## 2021-11-09 DIAGNOSIS — E782 Mixed hyperlipidemia: Secondary | ICD-10-CM | POA: Diagnosis not present

## 2021-11-09 DIAGNOSIS — E1165 Type 2 diabetes mellitus with hyperglycemia: Secondary | ICD-10-CM | POA: Diagnosis not present

## 2021-11-09 DIAGNOSIS — I10 Essential (primary) hypertension: Secondary | ICD-10-CM | POA: Diagnosis not present

## 2021-11-15 DIAGNOSIS — E7849 Other hyperlipidemia: Secondary | ICD-10-CM | POA: Diagnosis not present

## 2021-11-15 DIAGNOSIS — R4582 Worries: Secondary | ICD-10-CM | POA: Diagnosis not present

## 2021-11-15 DIAGNOSIS — N3946 Mixed incontinence: Secondary | ICD-10-CM | POA: Diagnosis not present

## 2021-11-15 DIAGNOSIS — E1165 Type 2 diabetes mellitus with hyperglycemia: Secondary | ICD-10-CM | POA: Diagnosis not present

## 2021-11-15 DIAGNOSIS — I482 Chronic atrial fibrillation, unspecified: Secondary | ICD-10-CM | POA: Diagnosis not present

## 2021-11-15 DIAGNOSIS — M069 Rheumatoid arthritis, unspecified: Secondary | ICD-10-CM | POA: Diagnosis not present

## 2021-11-15 DIAGNOSIS — I10 Essential (primary) hypertension: Secondary | ICD-10-CM | POA: Diagnosis not present

## 2021-11-15 DIAGNOSIS — F09 Unspecified mental disorder due to known physiological condition: Secondary | ICD-10-CM | POA: Diagnosis not present

## 2022-01-23 DIAGNOSIS — Z6832 Body mass index (BMI) 32.0-32.9, adult: Secondary | ICD-10-CM | POA: Diagnosis not present

## 2022-01-23 DIAGNOSIS — L03119 Cellulitis of unspecified part of limb: Secondary | ICD-10-CM | POA: Diagnosis not present

## 2022-01-23 DIAGNOSIS — R609 Edema, unspecified: Secondary | ICD-10-CM | POA: Diagnosis not present

## 2022-03-09 ENCOUNTER — Ambulatory Visit (INDEPENDENT_AMBULATORY_CARE_PROVIDER_SITE_OTHER): Payer: Medicare HMO

## 2022-03-09 DIAGNOSIS — I495 Sick sinus syndrome: Secondary | ICD-10-CM

## 2022-03-13 LAB — CUP PACEART REMOTE DEVICE CHECK
Battery Impedance: 404 Ohm
Battery Remaining Longevity: 119 mo
Battery Voltage: 2.8 V
Brady Statistic AP VP Percent: 0 %
Brady Statistic AP VS Percent: 3 %
Brady Statistic AS VP Percent: 2 %
Brady Statistic AS VS Percent: 95 %
Date Time Interrogation Session: 20230811131538
Implantable Lead Implant Date: 20061110
Implantable Lead Implant Date: 20061110
Implantable Lead Location: 753859
Implantable Lead Location: 753860
Implantable Lead Model: 5076
Implantable Lead Model: 5076
Implantable Pulse Generator Implant Date: 20170106
Lead Channel Impedance Value: 532 Ohm
Lead Channel Impedance Value: 625 Ohm
Lead Channel Pacing Threshold Amplitude: 0.5 V
Lead Channel Pacing Threshold Amplitude: 0.75 V
Lead Channel Pacing Threshold Pulse Width: 0.4 ms
Lead Channel Pacing Threshold Pulse Width: 0.4 ms
Lead Channel Setting Pacing Amplitude: 1.5 V
Lead Channel Setting Pacing Amplitude: 2 V
Lead Channel Setting Pacing Pulse Width: 0.4 ms
Lead Channel Setting Sensing Sensitivity: 5.6 mV

## 2022-04-04 DIAGNOSIS — F09 Unspecified mental disorder due to known physiological condition: Secondary | ICD-10-CM | POA: Diagnosis not present

## 2022-04-04 DIAGNOSIS — N390 Urinary tract infection, site not specified: Secondary | ICD-10-CM | POA: Diagnosis not present

## 2022-04-04 DIAGNOSIS — Z6829 Body mass index (BMI) 29.0-29.9, adult: Secondary | ICD-10-CM | POA: Diagnosis not present

## 2022-04-04 DIAGNOSIS — I482 Chronic atrial fibrillation, unspecified: Secondary | ICD-10-CM | POA: Diagnosis not present

## 2022-04-04 DIAGNOSIS — W19XXXA Unspecified fall, initial encounter: Secondary | ICD-10-CM | POA: Diagnosis not present

## 2022-04-04 DIAGNOSIS — R410 Disorientation, unspecified: Secondary | ICD-10-CM | POA: Diagnosis not present

## 2022-04-06 NOTE — Progress Notes (Signed)
Remote pacemaker transmission.   

## 2022-04-07 ENCOUNTER — Telehealth: Payer: Self-pay | Admitting: *Deleted

## 2022-04-07 NOTE — Patient Outreach (Signed)
  Care Coordination   Initial Visit Note   04/07/2022 Name: Tracey Koch MRN: [id number] DOB: 08/21/1945  Tracey Koch is a 76 y.o. year old female who sees Sasser, Tracey Critchley, MD for primary care. I spoke with  Tracey Koch by phone today.  What matters to the patients health and wellness today?  Declines engagement but agrees to have program information mailed to the home.     SDOH assessments and interventions completed:  No     Care Coordination Interventions Activated:  No  Care Coordination Interventions:  No, not indicated   Follow up plan: No further intervention required.   Encounter Outcome:  Pt. Refused   Kemper Durie, RN, MSN, [facility] Care Management Care Management Coordinator [phone]

## 2022-04-14 DIAGNOSIS — R300 Dysuria: Secondary | ICD-10-CM | POA: Diagnosis not present

## 2022-04-21 DIAGNOSIS — Z23 Encounter for immunization: Secondary | ICD-10-CM | POA: Diagnosis not present

## 2022-05-05 DIAGNOSIS — J441 Chronic obstructive pulmonary disease with (acute) exacerbation: Secondary | ICD-10-CM | POA: Diagnosis not present

## 2022-05-05 DIAGNOSIS — R410 Disorientation, unspecified: Secondary | ICD-10-CM | POA: Diagnosis not present

## 2022-05-05 DIAGNOSIS — G319 Degenerative disease of nervous system, unspecified: Secondary | ICD-10-CM | POA: Diagnosis not present

## 2022-05-05 DIAGNOSIS — E1142 Type 2 diabetes mellitus with diabetic polyneuropathy: Secondary | ICD-10-CM | POA: Diagnosis not present

## 2022-05-05 DIAGNOSIS — G894 Chronic pain syndrome: Secondary | ICD-10-CM | POA: Diagnosis not present

## 2022-05-05 DIAGNOSIS — W1839XA Other fall on same level, initial encounter: Secondary | ICD-10-CM | POA: Diagnosis not present

## 2022-05-05 DIAGNOSIS — S0990XA Unspecified injury of head, initial encounter: Secondary | ICD-10-CM | POA: Diagnosis not present

## 2022-05-05 DIAGNOSIS — E7800 Pure hypercholesterolemia, unspecified: Secondary | ICD-10-CM | POA: Diagnosis not present

## 2022-05-05 DIAGNOSIS — I6789 Other cerebrovascular disease: Secondary | ICD-10-CM | POA: Diagnosis not present

## 2022-05-05 DIAGNOSIS — I6782 Cerebral ischemia: Secondary | ICD-10-CM | POA: Diagnosis not present

## 2022-05-05 DIAGNOSIS — I10 Essential (primary) hypertension: Secondary | ICD-10-CM | POA: Diagnosis not present

## 2022-06-08 ENCOUNTER — Ambulatory Visit (INDEPENDENT_AMBULATORY_CARE_PROVIDER_SITE_OTHER): Payer: Medicare HMO

## 2022-06-08 DIAGNOSIS — I495 Sick sinus syndrome: Secondary | ICD-10-CM

## 2022-06-10 LAB — CUP PACEART REMOTE DEVICE CHECK
Battery Impedance: 452 Ohm
Battery Remaining Longevity: 112 mo
Battery Voltage: 2.79 V
Brady Statistic AP VP Percent: 0 %
Brady Statistic AP VS Percent: 3 %
Brady Statistic AS VP Percent: 8 %
Brady Statistic AS VS Percent: 88 %
Date Time Interrogation Session: 20231109172934
Implantable Lead Connection Status: 753985
Implantable Lead Connection Status: 753985
Implantable Lead Implant Date: 20061110
Implantable Lead Implant Date: 20061110
Implantable Lead Location: 753859
Implantable Lead Location: 753860
Implantable Lead Model: 5076
Implantable Lead Model: 5076
Implantable Pulse Generator Implant Date: 20170106
Lead Channel Impedance Value: 485 Ohm
Lead Channel Impedance Value: 637 Ohm
Lead Channel Pacing Threshold Amplitude: 0.5 V
Lead Channel Pacing Threshold Amplitude: 0.75 V
Lead Channel Pacing Threshold Pulse Width: 0.4 ms
Lead Channel Pacing Threshold Pulse Width: 0.4 ms
Lead Channel Setting Pacing Amplitude: 1.5 V
Lead Channel Setting Pacing Amplitude: 2 V
Lead Channel Setting Pacing Pulse Width: 0.4 ms
Lead Channel Setting Sensing Sensitivity: 5.6 mV
Zone Setting Status: 755011
Zone Setting Status: 755011

## 2022-06-16 NOTE — Progress Notes (Signed)
Remote pacemaker transmission.   

## 2022-07-04 DIAGNOSIS — F09 Unspecified mental disorder due to known physiological condition: Secondary | ICD-10-CM | POA: Diagnosis not present

## 2022-07-04 DIAGNOSIS — N3946 Mixed incontinence: Secondary | ICD-10-CM | POA: Diagnosis not present

## 2022-07-04 DIAGNOSIS — I10 Essential (primary) hypertension: Secondary | ICD-10-CM | POA: Diagnosis not present

## 2022-07-04 DIAGNOSIS — E7849 Other hyperlipidemia: Secondary | ICD-10-CM | POA: Diagnosis not present

## 2022-07-04 DIAGNOSIS — N1832 Chronic kidney disease, stage 3b: Secondary | ICD-10-CM | POA: Diagnosis not present

## 2022-07-04 DIAGNOSIS — E1165 Type 2 diabetes mellitus with hyperglycemia: Secondary | ICD-10-CM | POA: Diagnosis not present

## 2022-07-04 DIAGNOSIS — M069 Rheumatoid arthritis, unspecified: Secondary | ICD-10-CM | POA: Diagnosis not present

## 2022-07-04 DIAGNOSIS — F5101 Primary insomnia: Secondary | ICD-10-CM | POA: Diagnosis not present

## 2022-07-04 DIAGNOSIS — I482 Chronic atrial fibrillation, unspecified: Secondary | ICD-10-CM | POA: Diagnosis not present

## 2022-07-13 DIAGNOSIS — M6281 Muscle weakness (generalized): Secondary | ICD-10-CM | POA: Diagnosis not present

## 2022-07-13 DIAGNOSIS — R296 Repeated falls: Secondary | ICD-10-CM | POA: Diagnosis not present

## 2022-07-17 DIAGNOSIS — M6281 Muscle weakness (generalized): Secondary | ICD-10-CM | POA: Diagnosis not present

## 2022-07-17 DIAGNOSIS — R296 Repeated falls: Secondary | ICD-10-CM | POA: Diagnosis not present

## 2022-07-20 DIAGNOSIS — R296 Repeated falls: Secondary | ICD-10-CM | POA: Diagnosis not present

## 2022-07-20 DIAGNOSIS — M6281 Muscle weakness (generalized): Secondary | ICD-10-CM | POA: Diagnosis not present

## 2022-07-27 DIAGNOSIS — M6281 Muscle weakness (generalized): Secondary | ICD-10-CM | POA: Diagnosis not present

## 2022-07-27 DIAGNOSIS — R296 Repeated falls: Secondary | ICD-10-CM | POA: Diagnosis not present

## 2022-08-01 DIAGNOSIS — R296 Repeated falls: Secondary | ICD-10-CM | POA: Diagnosis not present

## 2022-08-01 DIAGNOSIS — M6281 Muscle weakness (generalized): Secondary | ICD-10-CM | POA: Diagnosis not present

## 2022-08-10 DIAGNOSIS — R296 Repeated falls: Secondary | ICD-10-CM | POA: Diagnosis not present

## 2022-08-10 DIAGNOSIS — M6281 Muscle weakness (generalized): Secondary | ICD-10-CM | POA: Diagnosis not present

## 2022-08-15 DIAGNOSIS — Z6827 Body mass index (BMI) 27.0-27.9, adult: Secondary | ICD-10-CM | POA: Diagnosis not present

## 2022-08-15 DIAGNOSIS — R300 Dysuria: Secondary | ICD-10-CM | POA: Diagnosis not present

## 2022-08-15 DIAGNOSIS — M545 Low back pain, unspecified: Secondary | ICD-10-CM | POA: Diagnosis not present

## 2022-08-15 DIAGNOSIS — R296 Repeated falls: Secondary | ICD-10-CM | POA: Diagnosis not present

## 2022-08-15 DIAGNOSIS — I350 Nonrheumatic aortic (valve) stenosis: Secondary | ICD-10-CM | POA: Diagnosis not present

## 2022-08-15 DIAGNOSIS — R030 Elevated blood-pressure reading, without diagnosis of hypertension: Secondary | ICD-10-CM | POA: Diagnosis not present

## 2022-08-15 DIAGNOSIS — M6281 Muscle weakness (generalized): Secondary | ICD-10-CM | POA: Diagnosis not present

## 2022-08-15 DIAGNOSIS — S32010A Wedge compression fracture of first lumbar vertebra, initial encounter for closed fracture: Secondary | ICD-10-CM | POA: Diagnosis not present

## 2022-08-17 DIAGNOSIS — M6281 Muscle weakness (generalized): Secondary | ICD-10-CM | POA: Diagnosis not present

## 2022-08-17 DIAGNOSIS — R296 Repeated falls: Secondary | ICD-10-CM | POA: Diagnosis not present

## 2022-08-21 DIAGNOSIS — M6281 Muscle weakness (generalized): Secondary | ICD-10-CM | POA: Diagnosis not present

## 2022-08-21 DIAGNOSIS — R296 Repeated falls: Secondary | ICD-10-CM | POA: Diagnosis not present

## 2022-08-24 DIAGNOSIS — M6281 Muscle weakness (generalized): Secondary | ICD-10-CM | POA: Diagnosis not present

## 2022-08-24 DIAGNOSIS — R296 Repeated falls: Secondary | ICD-10-CM | POA: Diagnosis not present

## 2022-08-25 DIAGNOSIS — R58 Hemorrhage, not elsewhere classified: Secondary | ICD-10-CM | POA: Diagnosis not present

## 2022-08-25 DIAGNOSIS — D649 Anemia, unspecified: Secondary | ICD-10-CM | POA: Diagnosis not present

## 2022-08-25 DIAGNOSIS — R220 Localized swelling, mass and lump, head: Secondary | ICD-10-CM | POA: Diagnosis not present

## 2022-08-25 DIAGNOSIS — R519 Headache, unspecified: Secondary | ICD-10-CM | POA: Diagnosis not present

## 2022-08-25 DIAGNOSIS — I10 Essential (primary) hypertension: Secondary | ICD-10-CM | POA: Diagnosis not present

## 2022-08-25 DIAGNOSIS — J449 Chronic obstructive pulmonary disease, unspecified: Secondary | ICD-10-CM | POA: Diagnosis not present

## 2022-08-25 DIAGNOSIS — R609 Edema, unspecified: Secondary | ICD-10-CM | POA: Diagnosis not present

## 2022-08-25 DIAGNOSIS — I959 Hypotension, unspecified: Secondary | ICD-10-CM | POA: Diagnosis not present

## 2022-08-25 DIAGNOSIS — S0083XA Contusion of other part of head, initial encounter: Secondary | ICD-10-CM | POA: Diagnosis not present

## 2022-08-25 DIAGNOSIS — E1142 Type 2 diabetes mellitus with diabetic polyneuropathy: Secondary | ICD-10-CM | POA: Diagnosis not present

## 2022-08-25 DIAGNOSIS — H05222 Edema of left orbit: Secondary | ICD-10-CM | POA: Diagnosis not present

## 2022-08-25 DIAGNOSIS — E119 Type 2 diabetes mellitus without complications: Secondary | ICD-10-CM | POA: Diagnosis not present

## 2022-08-25 DIAGNOSIS — W19XXXA Unspecified fall, initial encounter: Secondary | ICD-10-CM | POA: Diagnosis not present

## 2022-08-25 DIAGNOSIS — S0993XA Unspecified injury of face, initial encounter: Secondary | ICD-10-CM | POA: Diagnosis not present

## 2022-08-25 DIAGNOSIS — S0081XA Abrasion of other part of head, initial encounter: Secondary | ICD-10-CM | POA: Diagnosis not present

## 2022-08-25 DIAGNOSIS — E7800 Pure hypercholesterolemia, unspecified: Secondary | ICD-10-CM | POA: Diagnosis not present

## 2022-08-25 DIAGNOSIS — Z950 Presence of cardiac pacemaker: Secondary | ICD-10-CM | POA: Diagnosis not present

## 2022-08-25 DIAGNOSIS — G8929 Other chronic pain: Secondary | ICD-10-CM | POA: Diagnosis not present

## 2022-08-25 DIAGNOSIS — W07XXXA Fall from chair, initial encounter: Secondary | ICD-10-CM | POA: Diagnosis not present

## 2022-08-31 DIAGNOSIS — M6281 Muscle weakness (generalized): Secondary | ICD-10-CM | POA: Diagnosis not present

## 2022-08-31 DIAGNOSIS — R296 Repeated falls: Secondary | ICD-10-CM | POA: Diagnosis not present

## 2022-09-07 ENCOUNTER — Ambulatory Visit (INDEPENDENT_AMBULATORY_CARE_PROVIDER_SITE_OTHER): Payer: Medicare HMO

## 2022-09-07 DIAGNOSIS — I495 Sick sinus syndrome: Secondary | ICD-10-CM

## 2022-09-08 LAB — CUP PACEART REMOTE DEVICE CHECK
Battery Impedance: 527 Ohm
Battery Remaining Longevity: 105 mo
Battery Voltage: 2.79 V
Brady Statistic AP VP Percent: 0 %
Brady Statistic AP VS Percent: 4 %
Brady Statistic AS VP Percent: 9 %
Brady Statistic AS VS Percent: 87 %
Date Time Interrogation Session: 20240209110649
Implantable Lead Connection Status: 753985
Implantable Lead Connection Status: 753985
Implantable Lead Implant Date: 20061110
Implantable Lead Implant Date: 20061110
Implantable Lead Location: 753859
Implantable Lead Location: 753860
Implantable Lead Model: 5076
Implantable Lead Model: 5076
Implantable Pulse Generator Implant Date: 20170106
Lead Channel Impedance Value: 451 Ohm
Lead Channel Impedance Value: 586 Ohm
Lead Channel Pacing Threshold Amplitude: 0.375 V
Lead Channel Pacing Threshold Amplitude: 0.875 V
Lead Channel Pacing Threshold Pulse Width: 0.4 ms
Lead Channel Pacing Threshold Pulse Width: 0.4 ms
Lead Channel Setting Pacing Amplitude: 1.5 V
Lead Channel Setting Pacing Amplitude: 2 V
Lead Channel Setting Pacing Pulse Width: 0.4 ms
Lead Channel Setting Sensing Sensitivity: 5.6 mV
Zone Setting Status: 755011
Zone Setting Status: 755011

## 2022-09-11 DIAGNOSIS — M6281 Muscle weakness (generalized): Secondary | ICD-10-CM | POA: Diagnosis not present

## 2022-09-11 DIAGNOSIS — R296 Repeated falls: Secondary | ICD-10-CM | POA: Diagnosis not present

## 2022-09-14 DIAGNOSIS — R296 Repeated falls: Secondary | ICD-10-CM | POA: Diagnosis not present

## 2022-09-14 DIAGNOSIS — M6281 Muscle weakness (generalized): Secondary | ICD-10-CM | POA: Diagnosis not present

## 2022-09-18 DIAGNOSIS — R296 Repeated falls: Secondary | ICD-10-CM | POA: Diagnosis not present

## 2022-09-18 DIAGNOSIS — M6281 Muscle weakness (generalized): Secondary | ICD-10-CM | POA: Diagnosis not present

## 2022-09-21 DIAGNOSIS — M6281 Muscle weakness (generalized): Secondary | ICD-10-CM | POA: Diagnosis not present

## 2022-09-21 DIAGNOSIS — R296 Repeated falls: Secondary | ICD-10-CM | POA: Diagnosis not present

## 2022-09-26 ENCOUNTER — Ambulatory Visit: Payer: Medicare HMO | Attending: Internal Medicine | Admitting: Internal Medicine

## 2022-09-26 VITALS — BP 146/70 | HR 67 | Ht 64.0 in | Wt 174.0 lb

## 2022-09-26 DIAGNOSIS — R296 Repeated falls: Secondary | ICD-10-CM | POA: Diagnosis not present

## 2022-09-26 DIAGNOSIS — I495 Sick sinus syndrome: Secondary | ICD-10-CM

## 2022-09-26 DIAGNOSIS — I350 Nonrheumatic aortic (valve) stenosis: Secondary | ICD-10-CM | POA: Diagnosis not present

## 2022-09-26 DIAGNOSIS — M6281 Muscle weakness (generalized): Secondary | ICD-10-CM | POA: Diagnosis not present

## 2022-09-26 LAB — CUP PACEART INCLINIC DEVICE CHECK
Battery Impedance: 552 Ohm
Battery Remaining Longevity: 103 mo
Battery Voltage: 2.79 V
Brady Statistic AP VP Percent: 0 %
Brady Statistic AP VS Percent: 4 %
Brady Statistic AS VP Percent: 10 %
Brady Statistic AS VS Percent: 86 %
Date Time Interrogation Session: 20240227174633
Implantable Lead Connection Status: 753985
Implantable Lead Connection Status: 753985
Implantable Lead Implant Date: 20061110
Implantable Lead Implant Date: 20061110
Implantable Lead Location: 753859
Implantable Lead Location: 753860
Implantable Lead Model: 5076
Implantable Lead Model: 5076
Implantable Pulse Generator Implant Date: 20170106
Lead Channel Impedance Value: 453 Ohm
Lead Channel Impedance Value: 609 Ohm
Lead Channel Pacing Threshold Amplitude: 0.5 V
Lead Channel Pacing Threshold Amplitude: 0.75 V
Lead Channel Pacing Threshold Amplitude: 0.75 V
Lead Channel Pacing Threshold Pulse Width: 0.4 ms
Lead Channel Pacing Threshold Pulse Width: 0.4 ms
Lead Channel Pacing Threshold Pulse Width: 0.4 ms
Lead Channel Sensing Intrinsic Amplitude: 15.67 mV
Lead Channel Sensing Intrinsic Amplitude: 2 mV
Lead Channel Setting Pacing Amplitude: 1.5 V
Lead Channel Setting Pacing Amplitude: 2 V
Lead Channel Setting Pacing Pulse Width: 0.4 ms
Lead Channel Setting Sensing Sensitivity: 5.6 mV
Zone Setting Status: 755011
Zone Setting Status: 755011

## 2022-09-26 MED ORDER — FUROSEMIDE 40 MG PO TABS: 40.0000 mg | ORAL_TABLET | Freq: Every day | ORAL | 11 refills | Status: DC

## 2022-09-26 NOTE — Progress Notes (Signed)
HPI Tracey Koch returns today for followup. She is a pleasant 77 yo woman with a h/o symptomatic bradycardia, s/p PPM, HTN, arthritis, and DM. In the interim, she has been stable. No chest pain. She has class 2-3 dyspnea. No syncope. She notes occaisional palpitations. She has some arthritic symptoms. She notes some peripheral edema. She claims to have stopped smoking 8 months ago. She has claudication.   Allergies  Allergen Reactions   Penicillins Shortness Of Breath, Itching, Swelling and Rash    Has patient had a PCN reaction causing immediate rash, facial/tongue/throat swelling, SOB or lightheadedness with hypotension: Yes Has patient had a PCN reaction causing severe rash involving mucus membranes or skin necrosis: No Has patient had a PCN reaction that required hospitalization No Has patient had a PCN reaction occurring within the last 10 years: No If all of the above answers are "NO", then may proceed with Cephalosporin use.    Lisinopril Cough   Macrobid [Nitrofurantoin] Hives, Diarrhea, Itching and Nausea And Vomiting   Codeine Itching, Swelling and Rash   Levaquin [Levofloxacin In D5w] Itching, Swelling and Other (See Comments)    Can take low dose   Sulfa Drugs Cross Reactors Itching, Swelling and Rash     Current Outpatient Medications  Medication Sig Dispense Refill   albuterol (VENTOLIN HFA) 108 (90 Base) MCG/ACT inhaler Inhale into the lungs every 6 (six) hours as needed for wheezing or shortness of breath.     apixaban (ELIQUIS) 5 MG TABS tablet Take 1 tablet (5 mg total) by mouth 2 (two) times daily. 60 tablet 11   furosemide (LASIX) 40 MG tablet Take 1 tablet (40 mg total) by mouth daily. 30 tablet 11   HYDROcodone-acetaminophen (NORCO) 7.5-325 MG tablet Take 1 tablet by mouth 3 (three) times daily.      ipratropium (ATROVENT) 0.02 % nebulizer solution Inhale into the lungs.     losartan (COZAAR) 50 MG tablet Take 1 tablet (50 mg total) by mouth daily. 90 tablet  3   metFORMIN (GLUCOPHAGE) 500 MG tablet Take 1,000 mg by mouth 2 (two) times daily with a meal.      Omega-3 Fatty Acids (OMEGA-3 FISH OIL PO) Take 1 capsule by mouth 2 (two) times daily. OMEGA XL     simvastatin (ZOCOR) 20 MG tablet Take 20 mg by mouth every evening.     solifenacin (VESICARE) 5 MG tablet Take 5 mg by mouth daily.     traZODone (DESYREL) 150 MG tablet Take 150 mg by mouth at bedtime.     fexofenadine (ALLEGRA) 180 MG tablet Take 180 mg by mouth daily. (Patient not taking: Reported on 09/26/2022)     HYDROcodone-acetaminophen (NORCO) 7.5-325 MG tablet Take 1 tablet by mouth every 6 (six) hours as needed for moderate pain.  (Patient not taking: Reported on 09/26/2022)     No current facility-administered medications for this visit.     Past Medical History:  Diagnosis Date   Anemia    takes Folic Acid daily   Arthritis    takes Plaquenil daily and Methotrexate 2 times a week   Cough    takes Allegra daily   Depression    takes Prozac at bedtime   Diabetes mellitus without complication (HCC)    takes Metformin daily   Family history of anesthesia complication    sister post op N/V   History of blood transfusion    History of UTI    Hyperlipidemia    takes  Simvastatin nightly   Joint pain    Joint swelling    Pacemaker    Seasonal allergies    takes Allegra daily   Sinoatrial node dysfunction ([place])    has ICD    ROS:   All systems reviewed and negative except as noted in the HPI.   Past Surgical History:  Procedure Laterality Date   ABDOMINAL HYSTERECTOMY     CHOLECYSTECTOMY     COLONOSCOPY     EP IMPLANTABLE DEVICE N/A 08/06/2015   Procedure: PPM Generator Changeout;  Surgeon: Evans Lance, MD;  Location: [facility];  Service: Cardiovascular;  Laterality: N/A;   INSERT / REPLACE / REMOVE PACEMAKER     right knee replacement     TOTAL KNEE ARTHROPLASTY Left 05/06/2013   Dr Durward Fortes   TOTAL KNEE ARTHROPLASTY Left 05/06/2013   Procedure:  TOTAL KNEE ARTHROPLASTY;  Surgeon: Garald Balding, MD;  Location: [facility];  Service: Orthopedics;  Laterality: Left;     Family History  Problem Relation Age of Onset   COPD Mother    Stroke Father    Heart attack Father    Heart failure Sister    Diabetes Sister    CAD Brother    Diabetes Brother    Multiple sclerosis Sister    CAD Brother    Diabetes Brother    Renal cancer Brother      Social History   Socioeconomic History   Marital status: Married    Spouse name: Not on file   Number of children: Not on file   Years of education: Not on file   Highest education level: Not on file  Occupational History   Not on file  Tobacco Use   Smoking status: Former    Packs/day: 1.00    Years: 52.00    Total pack years: 52.00    Types: Cigarettes    Start date: 03/01/1962    Quit date: 11/28/2020    Years since quitting: 1.8   Smokeless tobacco: Never  Vaping Use   Vaping Use: Never used  Substance and Sexual Activity   Alcohol use: No    Alcohol/week: 0.0 standard drinks of alcohol   Drug use: No   Sexual activity: Not Currently    Birth control/protection: Surgical  Other Topics Concern   Not on file  Social History Narrative   Not on file   Social Determinants of Health   Financial Resource Strain: Not on file  Food Insecurity: Not on file  Transportation Needs: Not on file  Physical Activity: Not on file  Stress: Not on file  Social Connections: Not on file  Intimate Partner Violence: Not on file     BP (!) 146/70   Pulse 67   Ht '5\' 4"'$  (1.626 m)   Wt 174 lb (78.9 kg)   SpO2 94%   BMI 29.87 kg/m   Physical Exam:  Well appearing NAD HEENT: Unremarkable Neck:  No JVD, no thyromegally Lymphatics:  No adenopathy Back:  No CVA tenderness Lungs:  Clear HEART:  Regular rate rhythm, 3/6 systolic low pitched harsh murmur, no rubs, no clicks Abd:  soft, positive bowel sounds, no organomegally, no rebound, no guarding Ext:  2 plus pulses, no edema, no  cyanosis, no clubbing Skin:  No rashes no nodules Neuro:  CN II through XII intact, motor grossly intact  EKG - nsr with nonspecific STT abnormality.  DEVICE  Normal device function.  See PaceArt for details.   Assess/Plan:  1. Tobacco abuse - I encouraged the patient to remain off of cigarettes.  2. HTN - her bp is up a bit today. She states that it is controlled at home. I encouraged her to reduce her salt intake. We will increase her diuretic. 3. PPM - her Medtronic DDD PM is working normally. We will recheck in several months. 4. coags - she has not had any significant bleeding on her [place]. We will follow. 5. AS - her murmur is loud. A year ago she had a mean gradient of 16 mmHg. I plan to recheck her echo in another year, sooner if her symptoms worsen.   Cristopher Peru, MD

## 2022-09-26 NOTE — Patient Instructions (Addendum)
Medication Instructions:   Increase Lasix 40 mg Daily   *If you need a refill on your cardiac medications before your next appointment, please call your pharmacy*   Lab Work: NONE   If you have labs (blood work) drawn today and your tests are completely normal, you will receive your results only by: [place] (if you have MyChart) OR A paper copy in the mail If you have any lab test that is abnormal or we need to change your treatment, we will call you to review the results.   Testing/Procedures: Your physician has requested that you have an echocardiogram. Echocardiography is a painless test that uses sound waves to create images of your heart. It provides your doctor with information about the size and shape of your heart and how well your heart's chambers and valves are working. This procedure takes approximately one hour. There are no restrictions for this procedure. Please do NOT wear cologne, perfume, aftershave, or lotions (deodorant is allowed). Please arrive 15 minutes prior to your appointment time.    Follow-Up: At [facility], you and your health needs are our priority.  As part of our continuing mission to provide you with exceptional heart care, we have created designated Provider Care Teams.  These Care Teams include your primary Cardiologist (physician) and Advanced Practice Providers (APPs -  Physician Assistants and Nurse Practitioners) who all work together to provide you with the care you need, when you need it.  We recommend signing up for the patient portal called "MyChart".  Sign up information is provided on this After Visit Summary.  MyChart is used to connect with patients for Virtual Visits (Telemedicine).  Patients are able to view lab/test results, encounter notes, upcoming appointments, etc.  Non-urgent messages can be sent to your provider as well.   To learn more about what you can do with MyChart, go to [email or web address].    Your  next appointment:   1 year(s)  Provider:   Cristopher Peru, MD    Other Instructions Thank you for choosing [facility]!

## 2022-09-27 DIAGNOSIS — E1122 Type 2 diabetes mellitus with diabetic chronic kidney disease: Secondary | ICD-10-CM | POA: Diagnosis not present

## 2022-09-27 DIAGNOSIS — E7849 Other hyperlipidemia: Secondary | ICD-10-CM | POA: Diagnosis not present

## 2022-09-27 DIAGNOSIS — N1832 Chronic kidney disease, stage 3b: Secondary | ICD-10-CM | POA: Diagnosis not present

## 2022-09-27 DIAGNOSIS — N183 Chronic kidney disease, stage 3 unspecified: Secondary | ICD-10-CM | POA: Diagnosis not present

## 2022-09-27 DIAGNOSIS — E782 Mixed hyperlipidemia: Secondary | ICD-10-CM | POA: Diagnosis not present

## 2022-09-28 DIAGNOSIS — R296 Repeated falls: Secondary | ICD-10-CM | POA: Diagnosis not present

## 2022-09-28 DIAGNOSIS — M6281 Muscle weakness (generalized): Secondary | ICD-10-CM | POA: Diagnosis not present

## 2022-09-28 NOTE — Progress Notes (Signed)
Remote pacemaker transmission.   

## 2022-09-29 NOTE — Addendum Note (Signed)
Addended by: Sharee Holster R on: 09/29/2022 07:45 AM   Modules accepted: Orders

## 2022-10-02 DIAGNOSIS — I482 Chronic atrial fibrillation, unspecified: Secondary | ICD-10-CM | POA: Diagnosis not present

## 2022-10-02 DIAGNOSIS — I10 Essential (primary) hypertension: Secondary | ICD-10-CM | POA: Diagnosis not present

## 2022-10-02 DIAGNOSIS — R296 Repeated falls: Secondary | ICD-10-CM | POA: Diagnosis not present

## 2022-10-02 DIAGNOSIS — F5101 Primary insomnia: Secondary | ICD-10-CM | POA: Diagnosis not present

## 2022-10-02 DIAGNOSIS — M069 Rheumatoid arthritis, unspecified: Secondary | ICD-10-CM | POA: Diagnosis not present

## 2022-10-02 DIAGNOSIS — E7849 Other hyperlipidemia: Secondary | ICD-10-CM | POA: Diagnosis not present

## 2022-10-02 DIAGNOSIS — F09 Unspecified mental disorder due to known physiological condition: Secondary | ICD-10-CM | POA: Diagnosis not present

## 2022-10-02 DIAGNOSIS — E1165 Type 2 diabetes mellitus with hyperglycemia: Secondary | ICD-10-CM | POA: Diagnosis not present

## 2022-10-02 DIAGNOSIS — N3946 Mixed incontinence: Secondary | ICD-10-CM | POA: Diagnosis not present

## 2022-10-03 DIAGNOSIS — M6281 Muscle weakness (generalized): Secondary | ICD-10-CM | POA: Diagnosis not present

## 2022-10-03 DIAGNOSIS — R296 Repeated falls: Secondary | ICD-10-CM | POA: Diagnosis not present

## 2022-10-05 DIAGNOSIS — M6281 Muscle weakness (generalized): Secondary | ICD-10-CM | POA: Diagnosis not present

## 2022-10-05 DIAGNOSIS — R296 Repeated falls: Secondary | ICD-10-CM | POA: Diagnosis not present

## 2022-10-10 DIAGNOSIS — M6281 Muscle weakness (generalized): Secondary | ICD-10-CM | POA: Diagnosis not present

## 2022-10-10 DIAGNOSIS — R296 Repeated falls: Secondary | ICD-10-CM | POA: Diagnosis not present

## 2022-10-12 DIAGNOSIS — M6281 Muscle weakness (generalized): Secondary | ICD-10-CM | POA: Diagnosis not present

## 2022-10-12 DIAGNOSIS — M545 Low back pain, unspecified: Secondary | ICD-10-CM | POA: Diagnosis not present

## 2022-10-12 DIAGNOSIS — R296 Repeated falls: Secondary | ICD-10-CM | POA: Diagnosis not present

## 2022-10-25 ENCOUNTER — Emergency Department (HOSPITAL_COMMUNITY): Payer: Medicare PPO

## 2022-10-25 ENCOUNTER — Other Ambulatory Visit: Payer: Self-pay

## 2022-10-25 ENCOUNTER — Inpatient Hospital Stay (HOSPITAL_COMMUNITY)
Admission: EM | Admit: 2022-10-25 | Discharge: 2022-10-27 | DRG: 377 | Disposition: A | Discharge status: Home Health | Payer: Medicare PPO | Attending: Internal Medicine | Admitting: Internal Medicine

## 2022-10-25 ENCOUNTER — Ambulatory Visit (HOSPITAL_COMMUNITY): Admission: RE | Admit: 2022-10-25 | Payer: Medicare HMO | Source: Ambulatory Visit

## 2022-10-25 ENCOUNTER — Encounter (HOSPITAL_COMMUNITY): Payer: Self-pay

## 2022-10-25 DIAGNOSIS — F32A Depression, unspecified: Secondary | ICD-10-CM | POA: Diagnosis present

## 2022-10-25 DIAGNOSIS — J9811 Atelectasis: Secondary | ICD-10-CM | POA: Diagnosis not present

## 2022-10-25 DIAGNOSIS — D696 Thrombocytopenia, unspecified: Secondary | ICD-10-CM | POA: Diagnosis not present

## 2022-10-25 DIAGNOSIS — N189 Chronic kidney disease, unspecified: Secondary | ICD-10-CM

## 2022-10-25 DIAGNOSIS — K2970 Gastritis, unspecified, without bleeding: Secondary | ICD-10-CM | POA: Diagnosis present

## 2022-10-25 DIAGNOSIS — K921 Melena: Principal | ICD-10-CM | POA: Diagnosis present

## 2022-10-25 DIAGNOSIS — Z881 Allergy status to other antibiotic agents status: Secondary | ICD-10-CM

## 2022-10-25 DIAGNOSIS — E785 Hyperlipidemia, unspecified: Secondary | ICD-10-CM | POA: Diagnosis present

## 2022-10-25 DIAGNOSIS — J918 Pleural effusion in other conditions classified elsewhere: Secondary | ICD-10-CM | POA: Diagnosis present

## 2022-10-25 DIAGNOSIS — R0602 Shortness of breath: Secondary | ICD-10-CM | POA: Diagnosis not present

## 2022-10-25 DIAGNOSIS — I5033 Acute on chronic diastolic (congestive) heart failure: Secondary | ICD-10-CM | POA: Diagnosis not present

## 2022-10-25 DIAGNOSIS — Z66 Do not resuscitate: Secondary | ICD-10-CM | POA: Diagnosis not present

## 2022-10-25 DIAGNOSIS — I10 Essential (primary) hypertension: Secondary | ICD-10-CM | POA: Diagnosis present

## 2022-10-25 DIAGNOSIS — Z7983 Long term (current) use of bisphosphonates: Secondary | ICD-10-CM

## 2022-10-25 DIAGNOSIS — E1122 Type 2 diabetes mellitus with diabetic chronic kidney disease: Secondary | ICD-10-CM | POA: Diagnosis present

## 2022-10-25 DIAGNOSIS — W19XXXA Unspecified fall, initial encounter: Secondary | ICD-10-CM

## 2022-10-25 DIAGNOSIS — J90 Pleural effusion, not elsewhere classified: Secondary | ICD-10-CM | POA: Insufficient documentation

## 2022-10-25 DIAGNOSIS — I495 Sick sinus syndrome: Secondary | ICD-10-CM | POA: Diagnosis present

## 2022-10-25 DIAGNOSIS — R531 Weakness: Secondary | ICD-10-CM | POA: Diagnosis not present

## 2022-10-25 DIAGNOSIS — L89302 Pressure ulcer of unspecified buttock, stage 2: Secondary | ICD-10-CM | POA: Diagnosis present

## 2022-10-25 DIAGNOSIS — Z90710 Acquired absence of both cervix and uterus: Secondary | ICD-10-CM

## 2022-10-25 DIAGNOSIS — Z87440 Personal history of urinary (tract) infections: Secondary | ICD-10-CM

## 2022-10-25 DIAGNOSIS — Z882 Allergy status to sulfonamides status: Secondary | ICD-10-CM

## 2022-10-25 DIAGNOSIS — D649 Anemia, unspecified: Secondary | ICD-10-CM | POA: Diagnosis present

## 2022-10-25 DIAGNOSIS — Z7901 Long term (current) use of anticoagulants: Secondary | ICD-10-CM

## 2022-10-25 DIAGNOSIS — M069 Rheumatoid arthritis, unspecified: Secondary | ICD-10-CM | POA: Diagnosis present

## 2022-10-25 DIAGNOSIS — K319 Disease of stomach and duodenum, unspecified: Secondary | ICD-10-CM | POA: Diagnosis not present

## 2022-10-25 DIAGNOSIS — N1832 Chronic kidney disease, stage 3b: Secondary | ICD-10-CM | POA: Diagnosis not present

## 2022-10-25 DIAGNOSIS — M50322 Other cervical disc degeneration at C5-C6 level: Secondary | ICD-10-CM | POA: Diagnosis not present

## 2022-10-25 DIAGNOSIS — D62 Acute posthemorrhagic anemia: Secondary | ICD-10-CM | POA: Diagnosis not present

## 2022-10-25 DIAGNOSIS — K922 Gastrointestinal hemorrhage, unspecified: Secondary | ICD-10-CM | POA: Diagnosis not present

## 2022-10-25 DIAGNOSIS — Z833 Family history of diabetes mellitus: Secondary | ICD-10-CM

## 2022-10-25 DIAGNOSIS — Z950 Presence of cardiac pacemaker: Secondary | ICD-10-CM | POA: Diagnosis not present

## 2022-10-25 DIAGNOSIS — M1990 Unspecified osteoarthritis, unspecified site: Secondary | ICD-10-CM | POA: Diagnosis present

## 2022-10-25 DIAGNOSIS — D631 Anemia in chronic kidney disease: Secondary | ICD-10-CM | POA: Diagnosis present

## 2022-10-25 DIAGNOSIS — E119 Type 2 diabetes mellitus without complications: Secondary | ICD-10-CM | POA: Diagnosis not present

## 2022-10-25 DIAGNOSIS — I350 Nonrheumatic aortic (valve) stenosis: Secondary | ICD-10-CM | POA: Diagnosis not present

## 2022-10-25 DIAGNOSIS — I480 Paroxysmal atrial fibrillation: Secondary | ICD-10-CM | POA: Diagnosis not present

## 2022-10-25 DIAGNOSIS — R079 Chest pain, unspecified: Secondary | ICD-10-CM | POA: Diagnosis not present

## 2022-10-25 DIAGNOSIS — Y92009 Unspecified place in unspecified non-institutional (private) residence as the place of occurrence of the external cause: Secondary | ICD-10-CM

## 2022-10-25 DIAGNOSIS — E876 Hypokalemia: Secondary | ICD-10-CM | POA: Diagnosis present

## 2022-10-25 DIAGNOSIS — R109 Unspecified abdominal pain: Secondary | ICD-10-CM | POA: Diagnosis not present

## 2022-10-25 DIAGNOSIS — Z90722 Acquired absence of ovaries, bilateral: Secondary | ICD-10-CM

## 2022-10-25 DIAGNOSIS — Z880 Allergy status to penicillin: Secondary | ICD-10-CM

## 2022-10-25 DIAGNOSIS — Z634 Disappearance and death of family member: Secondary | ICD-10-CM

## 2022-10-25 DIAGNOSIS — K2951 Unspecified chronic gastritis with bleeding: Secondary | ICD-10-CM | POA: Diagnosis not present

## 2022-10-25 DIAGNOSIS — J302 Other seasonal allergic rhinitis: Secondary | ICD-10-CM | POA: Diagnosis present

## 2022-10-25 DIAGNOSIS — N179 Acute kidney failure, unspecified: Secondary | ICD-10-CM

## 2022-10-25 DIAGNOSIS — Z79891 Long term (current) use of opiate analgesic: Secondary | ICD-10-CM

## 2022-10-25 DIAGNOSIS — Z9079 Acquired absence of other genital organ(s): Secondary | ICD-10-CM

## 2022-10-25 DIAGNOSIS — Z7984 Long term (current) use of oral hypoglycemic drugs: Secondary | ICD-10-CM

## 2022-10-25 DIAGNOSIS — Z8249 Family history of ischemic heart disease and other diseases of the circulatory system: Secondary | ICD-10-CM

## 2022-10-25 DIAGNOSIS — I130 Hypertensive heart and chronic kidney disease with heart failure and stage 1 through stage 4 chronic kidney disease, or unspecified chronic kidney disease: Secondary | ICD-10-CM | POA: Diagnosis present

## 2022-10-25 DIAGNOSIS — S0990XA Unspecified injury of head, initial encounter: Secondary | ICD-10-CM | POA: Diagnosis not present

## 2022-10-25 DIAGNOSIS — L8990 Pressure ulcer of unspecified site, unspecified stage: Secondary | ICD-10-CM | POA: Diagnosis present

## 2022-10-25 DIAGNOSIS — M4856XA Collapsed vertebra, not elsewhere classified, lumbar region, initial encounter for fracture: Secondary | ICD-10-CM | POA: Diagnosis present

## 2022-10-25 DIAGNOSIS — Z885 Allergy status to narcotic agent status: Secondary | ICD-10-CM

## 2022-10-25 DIAGNOSIS — Z87891 Personal history of nicotine dependence: Secondary | ICD-10-CM

## 2022-10-25 DIAGNOSIS — I080 Rheumatic disorders of both mitral and aortic valves: Secondary | ICD-10-CM | POA: Diagnosis present

## 2022-10-25 DIAGNOSIS — Z9049 Acquired absence of other specified parts of digestive tract: Secondary | ICD-10-CM

## 2022-10-25 DIAGNOSIS — G894 Chronic pain syndrome: Secondary | ICD-10-CM | POA: Diagnosis present

## 2022-10-25 DIAGNOSIS — W1830XA Fall on same level, unspecified, initial encounter: Secondary | ICD-10-CM | POA: Diagnosis present

## 2022-10-25 DIAGNOSIS — Z96653 Presence of artificial knee joint, bilateral: Secondary | ICD-10-CM | POA: Diagnosis present

## 2022-10-25 DIAGNOSIS — K3189 Other diseases of stomach and duodenum: Secondary | ICD-10-CM | POA: Diagnosis not present

## 2022-10-25 DIAGNOSIS — Z888 Allergy status to other drugs, medicaments and biological substances status: Secondary | ICD-10-CM

## 2022-10-25 DIAGNOSIS — Z79899 Other long term (current) drug therapy: Secondary | ICD-10-CM

## 2022-10-25 DIAGNOSIS — M4312 Spondylolisthesis, cervical region: Secondary | ICD-10-CM | POA: Diagnosis not present

## 2022-10-25 DIAGNOSIS — N281 Cyst of kidney, acquired: Secondary | ICD-10-CM | POA: Diagnosis not present

## 2022-10-25 DIAGNOSIS — R102 Pelvic and perineal pain: Secondary | ICD-10-CM | POA: Diagnosis not present

## 2022-10-25 LAB — CBC WITH DIFFERENTIAL/PLATELET
Abs Immature Granulocytes: 0.01 10*3/uL (ref 0.00–0.07)
Basophils Absolute: 0 10*3/uL (ref 0.0–0.1)
Basophils Relative: 0 %
Eosinophils Absolute: 0 10*3/uL (ref 0.0–0.5)
Eosinophils Relative: 0 %
HCT: 28.4 % — ABNORMAL LOW (ref 36.0–46.0)
Hemoglobin: 9.1 g/dL — ABNORMAL LOW (ref 12.0–15.0)
Immature Granulocytes: 0 %
Lymphocytes Relative: 8 %
Lymphs Abs: 0.5 10*3/uL — ABNORMAL LOW (ref 0.7–4.0)
MCH: 27.7 pg (ref 26.0–34.0)
MCHC: 32 g/dL (ref 30.0–36.0)
MCV: 86.3 fL (ref 80.0–100.0)
Monocytes Absolute: 0.4 10*3/uL (ref 0.1–1.0)
Monocytes Relative: 7 %
Neutro Abs: 4.8 10*3/uL (ref 1.7–7.7)
Neutrophils Relative %: 85 %
Platelets: 118 10*3/uL — ABNORMAL LOW (ref 150–400)
RBC: 3.29 MIL/uL — ABNORMAL LOW (ref 3.87–5.11)
RDW: 15.9 % — ABNORMAL HIGH (ref 11.5–15.5)
WBC: 5.7 10*3/uL (ref 4.0–10.5)
nRBC: 0 % (ref 0.0–0.2)

## 2022-10-25 LAB — COMPREHENSIVE METABOLIC PANEL
ALT: 19 U/L (ref 0–44)
AST: 36 U/L (ref 15–41)
Albumin: 3.5 g/dL (ref 3.5–5.0)
Alkaline Phosphatase: 80 U/L (ref 38–126)
Anion gap: 11 (ref 5–15)
BUN: 35 mg/dL — ABNORMAL HIGH (ref 8–23)
CO2: 21 mmol/L — ABNORMAL LOW (ref 22–32)
Calcium: 9 mg/dL (ref 8.9–10.3)
Chloride: 108 mmol/L (ref 98–111)
Creatinine, Ser: 1.6 mg/dL — ABNORMAL HIGH (ref 0.44–1.00)
GFR, Estimated: 33 mL/min — ABNORMAL LOW (ref >60)
Glucose, Bld: 138 mg/dL — ABNORMAL HIGH (ref 70–99)
Potassium: 4.2 mmol/L (ref 3.5–5.1)
Sodium: 140 mmol/L (ref 135–145)
Total Bilirubin: 1.1 mg/dL (ref 0.3–1.2)
Total Protein: 7.6 g/dL (ref 6.5–8.1)

## 2022-10-25 LAB — HEMOGLOBIN AND HEMATOCRIT, BLOOD
HCT: 26 % — ABNORMAL LOW (ref 36.0–46.0)
Hemoglobin: 8.4 g/dL — ABNORMAL LOW (ref 12.0–15.0)

## 2022-10-25 LAB — TYPE AND SCREEN
ABO/RH(D): O POS
Antibody Screen: NEGATIVE

## 2022-10-25 LAB — I-STAT CHEM 8, ED
BUN: 32 mg/dL — ABNORMAL HIGH (ref 8–23)
Calcium, Ion: 1.19 mmol/L (ref 1.15–1.40)
Chloride: 110 mmol/L (ref 98–111)
Creatinine, Ser: 1.6 mg/dL — ABNORMAL HIGH (ref 0.44–1.00)
Glucose, Bld: 132 mg/dL — ABNORMAL HIGH (ref 70–99)
HCT: 28 % — ABNORMAL LOW (ref 36.0–46.0)
Hemoglobin: 9.5 g/dL — ABNORMAL LOW (ref 12.0–15.0)
Potassium: 4.3 mmol/L (ref 3.5–5.1)
Sodium: 144 mmol/L (ref 135–145)
TCO2: 22 mmol/L (ref 22–32)

## 2022-10-25 LAB — PROTIME-INR
INR: 1.8 — ABNORMAL HIGH (ref 0.8–1.2)
Prothrombin Time: 21.1 seconds — ABNORMAL HIGH (ref 11.4–15.2)

## 2022-10-25 LAB — TROPONIN I (HIGH SENSITIVITY)
Troponin I (High Sensitivity): 13 ng/L (ref <18)
Troponin I (High Sensitivity): 18 ng/L — ABNORMAL HIGH (ref <18)

## 2022-10-25 LAB — GLUCOSE, CAPILLARY: Glucose-Capillary: 105 mg/dL — ABNORMAL HIGH (ref 70–99)

## 2022-10-25 LAB — CK: Total CK: 155 U/L (ref 38–234)

## 2022-10-25 LAB — BRAIN NATRIURETIC PEPTIDE: B Natriuretic Peptide: 680 pg/mL — ABNORMAL HIGH (ref 0.0–100.0)

## 2022-10-25 MED ORDER — HYDRALAZINE HCL 20 MG/ML IJ SOLN: 5.0000 mg | Freq: Four times a day (QID) | INTRAMUSCULAR | Status: DC | PRN

## 2022-10-25 MED ORDER — HYDROCODONE-ACETAMINOPHEN 5-325 MG PO TABS
1.0000 | ORAL_TABLET | Freq: Four times a day (QID) | ORAL | Status: DC | PRN
  Administered 2022-10-25 – 2022-10-26 (×2): 1 via ORAL
  Filled 2022-10-25 (×2): qty 1

## 2022-10-25 MED ORDER — SODIUM CHLORIDE 0.9 % IV BOLUS
1000.0000 mL | Freq: Once | INTRAVENOUS | Status: AC
  Administered 2022-10-25: 1000 mL via INTRAVENOUS

## 2022-10-25 MED ORDER — IOHEXOL 300 MG/ML  SOLN
80.0000 mL | Freq: Once | INTRAMUSCULAR | Status: AC | PRN
  Administered 2022-10-25: 80 mL via INTRAVENOUS

## 2022-10-25 MED ORDER — SIMVASTATIN 20 MG PO TABS
20.0000 mg | ORAL_TABLET | Freq: Every evening | ORAL | Status: DC
  Administered 2022-10-25 – 2022-10-26 (×2): 20 mg via ORAL
  Filled 2022-10-25 (×2): qty 1

## 2022-10-25 MED ORDER — PANTOPRAZOLE SODIUM 40 MG IV SOLR: 40.0000 mg | Freq: Two times a day (BID) | INTRAVENOUS | Status: DC

## 2022-10-25 MED ORDER — AMLODIPINE BESYLATE 5 MG PO TABS
5.0000 mg | ORAL_TABLET | Freq: Every day | ORAL | Status: DC
  Administered 2022-10-26: 5 mg via ORAL
  Filled 2022-10-25 (×2): qty 1

## 2022-10-25 MED ORDER — PANTOPRAZOLE 80MG IVPB - SIMPLE MED
80.0000 mg | Freq: Once | INTRAVENOUS | Status: AC
  Administered 2022-10-25: 80 mg via INTRAVENOUS

## 2022-10-25 MED ORDER — FUROSEMIDE 10 MG/ML IJ SOLN
40.0000 mg | Freq: Once | INTRAMUSCULAR | Status: AC
  Administered 2022-10-25: 40 mg via INTRAVENOUS
  Filled 2022-10-25: qty 4

## 2022-10-25 MED ORDER — PANTOPRAZOLE SODIUM 40 MG IV SOLR
40.0000 mg | Freq: Once | INTRAVENOUS | Status: AC
  Administered 2022-10-25: 40 mg via INTRAVENOUS
  Filled 2022-10-25: qty 10

## 2022-10-25 MED ORDER — TRAZODONE HCL 50 MG PO TABS
150.0000 mg | ORAL_TABLET | Freq: Every day | ORAL | Status: DC
  Administered 2022-10-25 – 2022-10-26 (×2): 150 mg via ORAL
  Filled 2022-10-25 (×2): qty 3

## 2022-10-25 MED ORDER — INSULIN ASPART 100 UNIT/ML IJ SOLN: 0.0000 [IU] | Freq: Every day | INTRAMUSCULAR | Status: DC

## 2022-10-25 MED ORDER — INSULIN ASPART 100 UNIT/ML IJ SOLN
0.0000 [IU] | Freq: Three times a day (TID) | INTRAMUSCULAR | Status: DC
  Administered 2022-10-26: 1 [IU] via SUBCUTANEOUS

## 2022-10-25 MED ORDER — ALBUTEROL SULFATE (2.5 MG/3ML) 0.083% IN NEBU
2.5000 mg | INHALATION_SOLUTION | RESPIRATORY_TRACT | Status: DC | PRN
  Administered 2022-10-25 – 2022-10-26 (×2): 2.5 mg via RESPIRATORY_TRACT
  Filled 2022-10-25 (×2): qty 3

## 2022-10-25 MED ORDER — PANTOPRAZOLE INFUSION (NEW) - SIMPLE MED
8.0000 mg/h | INTRAVENOUS | Status: DC
  Administered 2022-10-26 – 2022-10-27 (×4): 8 mg/h via INTRAVENOUS
  Filled 2022-10-25: qty 80
  Filled 2022-10-25: qty 100
  Filled 2022-10-25 (×2): qty 80
  Filled 2022-10-25: qty 100
  Filled 2022-10-25: qty 80
  Filled 2022-10-25: qty 100

## 2022-10-25 MED ORDER — FESOTERODINE FUMARATE ER 4 MG PO TB24
4.0000 mg | ORAL_TABLET | Freq: Every day | ORAL | Status: DC
  Administered 2022-10-26: 4 mg via ORAL
  Filled 2022-10-25 (×4): qty 1

## 2022-10-25 NOTE — H&P (Addendum)
TRH H&P   Patient Demographics:    Tracey Koch, is a 77 y.o. female  MRN: [id number]   DOB - 1946-07-18  Admit Date - 10/25/2022  Outpatient Primary MD for the patient is Sasser, Silvestre Moment, MD  Referring MD/NP/PA: Dr Roderic Palau   Outpatient Specialists: cardiology Dr Lovena Le    Patient coming from: home  Chief Complaint  Patient presents with   Fall      HPI:    Tracey Koch  is a 77 y.o. female, h/o symptomatic bradycardia, s/p PPM, HTN, rheumatoid arthritis, and DM , chronic  pain syndrome and aortic valve stenosis. -Patient was brought by her daughter due to fall, patient currently lives by herself, husband unfortunately passed away just last month, she did report fall, she cannot recall exact events, but she denies any loss of consciousness, he remained on the floor for couple hours, no more, as family had been monitoring her with a camera installed in the house, and they were able to talk to her earlier today at 86 AM with no acute events, patient herself denies any fever, chills, chest pain, but she does report lightheadedness, and some dyspnea and wheezing, daughter was there to pick her up as she is supposed to have routine scheduled outpatient echo where she found her on the floor. -in ED EKG with sinus rhythm, she was noted to have melena, hemoglobin was 9.1 (most recent baseline was 4 years ago in our system but patient just had recent labs at her PCP with no noted abnormalities beside CKD.  She denies any NSAIDs use, no history of colonoscopy or endoscopy in the past, as well workup significant for large pleural effusion with atelectasis, continuing was at 1.6, [facility] consulted to admit.    Review of systems:      A full 10 point Review of Systems was done, except as stated above, all other Review of Systems were negative.   With Past History of the  following :    Past Medical History:  Diagnosis Date   Anemia    takes Folic Acid daily   Arthritis    takes Plaquenil daily and Methotrexate 2 times a week   Cough    takes Allegra daily   Depression    takes Prozac at bedtime   Diabetes mellitus without complication (HCC)    takes Metformin daily   Family history of anesthesia complication    sister post op N/V   History of blood transfusion    History of UTI    Hyperlipidemia    takes Simvastatin nightly   Joint pain    Joint swelling    Pacemaker    Seasonal allergies    takes Allegra daily   Sinoatrial node dysfunction ([place])    has ICD      Past Surgical History:  Procedure Laterality Date   ABDOMINAL HYSTERECTOMY  CHOLECYSTECTOMY     COLONOSCOPY     EP IMPLANTABLE DEVICE N/A 08/06/2015   Procedure: PPM Generator Changeout;  Surgeon: Evans Lance, MD;  Location: [facility];  Service: Cardiovascular;  Laterality: N/A;   INSERT / REPLACE / REMOVE PACEMAKER     right knee replacement     TOTAL KNEE ARTHROPLASTY Left 05/06/2013   Dr Durward Fortes   TOTAL KNEE ARTHROPLASTY Left 05/06/2013   Procedure: TOTAL KNEE ARTHROPLASTY;  Surgeon: Garald Balding, MD;  Location: [facility];  Service: Orthopedics;  Laterality: Left;      Social History:     Social History   Tobacco Use   Smoking status: Former    Packs/day: 1.00    Years: 52.00    Additional pack years: 0.00    Total pack years: 52.00    Types: Cigarettes    Start date: 03/01/1962    Quit date: 11/28/2020    Years since quitting: 1.9   Smokeless tobacco: Never  Substance Use Topics   Alcohol use: No    Alcohol/week: 0.0 standard drinks of alcohol       Family History :     Family History  Problem Relation Age of Onset   COPD Mother    Stroke Father    Heart attack Father    Heart failure Sister    Diabetes Sister    CAD Brother    Diabetes Brother    Multiple sclerosis Sister    CAD Brother    Diabetes Brother    Renal cancer  Brother     Home Medications:   Prior to Admission medications   Medication Sig Start Date End Date Taking? Authorizing Provider  albuterol (VENTOLIN HFA) 108 (90 Base) MCG/ACT inhaler Inhale into the lungs every 6 (six) hours as needed for wheezing or shortness of breath.   Yes [provider]  alendronate (FOSAMAX) 70 MG tablet Take 70 mg by mouth once a week. 10/11/22  Yes [provider]  apixaban (ELIQUIS) 5 MG TABS tablet Take 1 tablet (5 mg total) by mouth 2 (two) times daily. 02/22/17  Yes Evans Lance, MD  fexofenadine (ALLEGRA) 180 MG tablet Take 180 mg by mouth daily. 07/19/20  Yes [provider]  furosemide (LASIX) 20 MG tablet Take 20 mg by mouth daily. 10/12/22  Yes [provider]  HYDROcodone-acetaminophen (NORCO) 7.5-325 MG tablet Take 1 tablet by mouth 2 (two) times daily. 06/14/16  Yes [provider]  clindamycin (CLEOCIN) 150 MG capsule Take 300 mg by mouth every 6 (six) hours. Patient not taking: Reported on 10/25/2022 09/27/22   [provider]  furosemide (LASIX) 40 MG tablet Take 1 tablet (40 mg total) by mouth daily. Patient not taking: Reported on 10/25/2022 09/26/22   Evans Lance, MD  ipratropium (ATROVENT) 0.02 % nebulizer solution Inhale into the lungs. 05/29/18   [provider]  losartan (COZAAR) 50 MG tablet Take 1 tablet (50 mg total) by mouth daily. 04/04/18 09/26/22  Evans Lance, MD  metFORMIN (GLUCOPHAGE) 500 MG tablet Take 1,000 mg by mouth 2 (two) times daily with a meal.  07/09/12   [provider]  Omega-3 Fatty Acids (OMEGA-3 FISH OIL PO) Take 1 capsule by mouth 2 (two) times daily. OMEGA XL    [provider]  simvastatin (ZOCOR) 20 MG tablet Take 20 mg by mouth every evening.    [provider]  solifenacin (VESICARE) 5 MG tablet Take 5 mg by mouth  daily.    [provider]  traZODone (DESYREL) 150 MG tablet Take 150 mg by mouth at bedtime.     [provider]     Allergies:     Allergies  Allergen Reactions   Penicillins Shortness Of Breath, Itching, Swelling and Rash    Has patient had a PCN reaction causing immediate rash, facial/tongue/throat swelling, SOB or lightheadedness with hypotension: Yes Has patient had a PCN reaction causing severe rash involving mucus membranes or skin necrosis: No Has patient had a PCN reaction that required hospitalization No Has patient had a PCN reaction occurring within the last 10 years: No If all of the above answers are "NO", then may proceed with Cephalosporin use.    Lisinopril Cough   Macrobid [Nitrofurantoin] Hives, Diarrhea, Itching and Nausea And Vomiting   Codeine Itching, Swelling and Rash   Levaquin [Levofloxacin In D5w] Itching, Swelling and Other (See Comments)    Can take low dose   Sulfa Drugs Cross Reactors Itching, Swelling and Rash     Physical Exam:   Vitals  Blood pressure (!) 178/72, pulse 99, temperature (!) 97.5 F (36.4 C), temperature source Oral, resp. rate 15, height 5\' 6"  (1.676 m), weight 74.4 kg, SpO2 96 %.   1. General pale, ill-appearing female, laying in bed in mild discomfort due to pain, appears older than stated age  65. Normal affect and insight, Not Suicidal or Homicidal, Awake Alert, Oriented X 3.  3. No F.N deficits, ALL C.Nerves Intact, Strength 5/5 all 4 extremities, Sensation intact all 4 extremities, Plantars down going.  4. Ears and Eyes appear Normal, Conjunctivae clear, PERRLA. Moist Oral Mucosa.  5. Supple Neck, No JVD, No cervical lymphadenopathy appriciated, No Carotid Bruits.  6. Symmetrical Chest wall movement, significantly diminished air entry in the right lung, and with wheezing bilaterally  7. RRR, No Gallops, Rubs , systolic Murmur present , No Parasternal Heave.  8. Positive Bowel Sounds, Abdomen Soft, No tenderness, No organomegaly appriciated,No rebound -guarding or rigidity.  9.  No Cyanosis, Normal Skin  Turgor, chronic lower extremities can changing  10. Good muscle tone,  joints appear normal , no effusions, Normal ROM.  She has right elbow bruising secondary to fall     Data Review:    CBC Recent Labs  Lab 10/25/22 1606 10/25/22 1609  WBC  --  5.7  HGB 9.5* 9.1*  HCT 28.0* 28.4*  PLT  --  118*  MCV  --  86.3  MCH  --  27.7  MCHC  --  32.0  RDW  --  15.9*  LYMPHSABS  --  0.5*  MONOABS  --  0.4  EOSABS  --  0.0  BASOSABS  --  0.0   ------------------------------------------------------------------------------------------------------------------  Chemistries  Recent Labs  Lab 10/25/22 1606 10/25/22 1609  NA 144 140  K 4.3 4.2  CL 110 108  CO2  --  21*  GLUCOSE 132* 138*  BUN 32* 35*  CREATININE 1.60* 1.60*  CALCIUM  --  9.0  AST  --  36  ALT  --  19  ALKPHOS  --  80  BILITOT  --  1.1   ------------------------------------------------------------------------------------------------------------------ estimated creatinine clearance is 30.4 mL/min (A) (by C-G formula based on SCr of 1.6 mg/dL (H)). ------------------------------------------------------------------------------------------------------------------ No results for input(s): "TSH", "T4TOTAL", "T3FREE", "THYROIDAB" in the last 72 hours.  Invalid input(s): "FREET3"  Coagulation profile Recent Labs  Lab 10/25/22 1646  INR 1.8*   ------------------------------------------------------------------------------------------------------------------- No results for input(s): "DDIMER"  in the last 72 hours. -------------------------------------------------------------------------------------------------------------------  Cardiac Enzymes No results for input(s): "CKMB", "TROPONINI", "MYOGLOBIN" in the last 168 hours.  Invalid input(s): "CK" ------------------------------------------------------------------------------------------------------------------ No results found for:  "BNP"   ---------------------------------------------------------------------------------------------------------------  Urinalysis    Component Value Date/Time   COLORURINE YELLOW 02/26/2018 [place] 02/26/2018 1207   LABSPEC 1.011 02/26/2018 1207   PHURINE 7.0 02/26/2018 1207   GLUCOSEU NEGATIVE 02/26/2018 1207   HGBUR SMALL (A) 02/26/2018 1207   BILIRUBINUR NEGATIVE 02/26/2018 1207   [place] 02/26/2018 1207   PROTEINUR NEGATIVE 02/26/2018 1207   UROBILINOGEN 1.0 03/13/2014 1811   NITRITE POSITIVE (A) 02/26/2018 1207   LEUKOCYTESUR SMALL (A) 02/26/2018 1207    ----------------------------------------------------------------------------------------------------------------   Imaging Results:    CT ABDOMEN PELVIS W CONTRAST  Result Date: 10/25/2022 CLINICAL DATA:  Abdominal pain. EXAM: CT ABDOMEN AND PELVIS WITH CONTRAST TECHNIQUE: Multidetector CT imaging of the abdomen and pelvis was performed using the standard protocol following bolus administration of intravenous contrast. RADIATION DOSE REDUCTION: This exam was performed according to the departmental dose-optimization program which includes automated exposure control, adjustment of the mA and/or kV according to patient size and/or use of iterative reconstruction technique. CONTRAST:  53mL OMNIPAQUE IOHEXOL 300 MG/ML  SOLN COMPARISON:  CT abdomen pelvis dated 02/26/2018. FINDINGS: Motion limits the sensitivity of the exam. Lower chest: There is a large right pleural effusion with associated atelectasis. The heart is enlarged. Hepatobiliary: No focal liver abnormality is seen. Status post cholecystectomy. No biliary dilatation. Pancreas: Unremarkable. No pancreatic ductal dilatation or surrounding inflammatory changes. Spleen: The spleen is enlarged, similar to prior exam. Adrenals/Urinary Tract: Adrenal glands are unremarkable. Cysts in the left kidney measure up to 6 cm in size, unchanged from prior exam.  No imaging follow-up is recommended for this finding. No focal lesions in the right kidney. No renal calculi on either side. No hydronephrosis. Bladder is unremarkable. Stomach/Bowel: Stomach is within normal limits. The appendix is not definitely identified, however no pericecal inflammatory changes are noted to suggest acute appendicitis. There is colonic diverticulosis without evidence of diverticulitis. No evidence of bowel wall thickening, distention, or inflammatory changes. Vascular/Lymphatic: The bilateral ovarian veins are dilated and tortuous. No lymphadenopathy in the abdomen or pelvis. Reproductive: Status post hysterectomy. No adnexal masses. Other: No abdominal wall hernia or abnormality. No abdominopelvic ascites. Musculoskeletal: There is a chronic appearing compression fracture of L1 with 3 mm retropulsion into the central canal and approximately 50-75% height loss centrally. Degenerative changes are seen in the spine. IMPRESSION: 1. No acute process in the abdomen or pelvis. 2. Large right pleural effusion with associated atelectasis. 3. Dilated and tortuous bilateral ovarian veins can be seen in the setting of pelvic congestion syndrome. 4. Chronic appearing compression fracture of L1 with 3 mm retropulsion into the central canal. Electronically Signed   By: Zerita Boers M.D.   On: 10/25/2022 17:58   CT Cervical Spine Wo Contrast  Result Date: 10/25/2022 CLINICAL DATA:  Neck trauma (Age >= 65y) EXAM: CT CERVICAL SPINE WITHOUT CONTRAST TECHNIQUE: Multidetector CT imaging of the cervical spine was performed without intravenous contrast. Multiplanar CT image reconstructions were also generated. RADIATION DOSE REDUCTION: This exam was performed according to the departmental dose-optimization program which includes automated exposure control, adjustment of the mA and/or kV according to patient size and/or use of iterative reconstruction technique. COMPARISON:  None Available. FINDINGS: Motion  degraded exam. Alignment: Degenerative grade 1 anterolisthesis at C4-C5. Skull base and vertebrae: Extensive motion degradation which limits evaluation.  Within this limitation, there is no evidence of cervical spine fracture. Soft tissues and spinal canal: No prevertebral fluid or swelling. No visible canal hematoma. Disc levels: Moderate degenerative disc disease at C5-C6. Mild-to-moderate multilevel facet arthropathy. Upper chest: Layering right pleural effusion to the apex. Other: None. IMPRESSION: Motion degraded exam which limits evaluation. Within this limitation, there is no definite cervical spine fracture. If there is significant clinical concern, repeat exam should be considered. Electronically Signed   By: Maurine Simmering M.D.   On: 10/25/2022 17:54   CT Head Wo Contrast  Result Date: 10/25/2022 CLINICAL DATA:  Trauma EXAM: CT HEAD WITHOUT CONTRAST TECHNIQUE: Contiguous axial images were obtained from the base of the skull through the vertex without intravenous contrast. RADIATION DOSE REDUCTION: This exam was performed according to the departmental dose-optimization program which includes automated exposure control, adjustment of the mA and/or kV according to patient size and/or use of iterative reconstruction technique. COMPARISON:  Head CT 08/25/2022 FINDINGS: Brain: No evidence of acute infarction, hemorrhage, hydrocephalus, extra-axial collection or mass lesion/mass effect. Vascular: Atherosclerotic calcifications are present within the cavernous internal carotid arteries. Skull: Normal. Negative for fracture or focal lesion. Sinuses/Orbits: No acute finding. Other: None. IMPRESSION: No acute intracranial abnormality. Electronically Signed   By: Ronney Asters M.D.   On: 10/25/2022 17:49   DG Chest Port 1 View  Result Date: 10/25/2022 CLINICAL DATA:  Fall, pain, shortness of breath EXAM: PORTABLE CHEST 1 VIEW COMPARISON:  03/13/2014 FINDINGS: Redemonstrated left chest cardiac device with leads  overlying the right atrium and ventricle. Enlarged cardiac silhouette, although the right heart border is incompletely imaged. Moderate right pleural effusion with associated atelectasis. No left pleural effusion. Increased interstitial prominence, possibly interstitial pulmonary edema. No acute osseous abnormalities. Degenerative changes in the bilateral shoulders. IMPRESSION: 1. Moderate right pleural effusion with associated atelectasis. 2. Increased interstitial prominence, possibly interstitial pulmonary edema. 3. Concern for mild cardiomegaly, although the heart borders are incompletely seen. Electronically Signed   By: Merilyn Baba M.D.   On: 10/25/2022 16:15   DG Pelvis Portable  Result Date: 10/25/2022 CLINICAL DATA:  Fall with pain EXAM: PORTABLE PELVIS 1 VIEWS COMPARISON:  None Available. FINDINGS: There is no evidence of pelvic fracture or diastasis. No pelvic bone lesions are seen. Degenerative changes of the bilateral hips. Rounded soft tissue density projecting over the pelvis, likely distended urinary bladder. IMPRESSION: No acute osseous abnormality identified. Electronically Signed   By: Darrin Nipper M.D.   On: 10/25/2022 16:15     EKG:  Vent. rate 87 BPM PR interval 206 ms QRS duration 94 ms QT/QTcB 410/494 ms P-R-T axes 64 7 41 Sinus rhythm Anteroseptal infarct, old   Assessment & Plan:    Principal Problem:   GI bleed Active Problems:   Essential hypertension   Rheumatoid arthritis (HCC)   PACEMAKER, PERMANENT   Sinoatrial node dysfunction (HCC)   Type 2 diabetes mellitus without complication (HCC)   Paroxysmal atrial fibrillation (HCC)   Pleural effusion on right   Aortic valve stenosis   GI bleed Acute blood loss anemia -Patient has melena, Hemoccult positive stool, on Eliquis, denies any NSAIDs use, no GI workup in the past, no colonoscopy or endoscopy -hemoglobin 9.1, will monitor CBC closely and transfuse for hemoglobin less than 8 -GI consulted by ED,  official consult in a.m. -Will keep on clear liquid diet overnight, and will start on Protonix drip -Will hold Eliquis  AKI -Creatinine is 1.6, will hold nephrotoxic medications. -monitor  BMP closely.  She will receive IV Lasix  Fall -Denies any loss of consciousness, was unable to stand up on her own, will check total CK,will consult PT   Right pleural effusion -With significant dyspnea and wheezing, will request ultrasound-guided thoracentesis -Check BNP.  Aortic valve stenosis -supposed to have echo done today as an outpatient, will order during this hospital stay. -With right pleural effusion, will check BNP, no history of CHF in the past, will request cardiology consult as well  Sinoatrial dysfunction Status post permanent pacemaker -continuous telemetry monitoring   Paroxysmal A-fib  -Heart rate currently controlled in sinus rhythm, holding Eliquis due to GI bleed, monitor on telemetry.  Diabetes mellitus - Will hold metformin and keep on insulin sliding scale during hospital stay  Rheumatoid arthritis Chronic pain syndrome -Daughter at bedside report patient has tried multiple RA medications without success, she is only currently managed with Norco which will be continued  Hyperlipidemia -Continue with statin  Hypertension -will hold losartan due to AKI, and will not on Norvasc and keep on as needed hydralazine   DVT Prophylaxis  SCDs /holding Eliquis  AM Labs Ordered, also please review Full Orders  Family Communication: Admission, patients condition and plan of care including tests being ordered have been discussed with the patient and daughter at bedside  who indicate understanding and agree with the plan and Code Status.  Code Status DNR, confirmed by patient, daughter at bedside  Likely DC to  home  Condition GUARDED    Consults called: GI by ED, card consult requested via EPIC    Admission status: inpatient    Time spent in minutes : 70 minutes     Phillips Climes M.D on 10/25/2022 at 7:06 PM   [facility] - Office  [phone]

## 2022-10-25 NOTE — ED Provider Notes (Signed)
[facility] Provider Note   CSN: [id number] Arrival date & time: 10/25/22  1507     History {Add pertinent medical, surgical, social history, OB history to HPI:1} Chief Complaint  Patient presents with   Tracey Koch is a 77 y.o. female.  Patient has a history of a pacemaker and is on Eliquis.  She is found on the floor today by her daughter and with confused   Fall       Home Medications Prior to Admission medications   Medication Sig Start Date End Date Taking? Authorizing Provider  albuterol (VENTOLIN HFA) 108 (90 Base) MCG/ACT inhaler Inhale into the lungs every 6 (six) hours as needed for wheezing or shortness of breath.   Yes [provider]  alendronate (FOSAMAX) 70 MG tablet Take 70 mg by mouth once a week. 10/11/22  Yes [provider]  apixaban (ELIQUIS) 5 MG TABS tablet Take 1 tablet (5 mg total) by mouth 2 (two) times daily. 02/22/17  Yes Evans Lance, MD  fexofenadine (ALLEGRA) 180 MG tablet Take 180 mg by mouth daily. 07/19/20  Yes [provider]  furosemide (LASIX) 20 MG tablet Take 20 mg by mouth daily. 10/12/22  Yes [provider]  HYDROcodone-acetaminophen (NORCO) 7.5-325 MG tablet Take 1 tablet by mouth 2 (two) times daily. 06/14/16  Yes [provider]  losartan (COZAAR) 50 MG tablet Take 1 tablet (50 mg total) by mouth daily. 04/04/18 10/25/22 Yes Evans Lance, MD  metFORMIN (GLUCOPHAGE) 500 MG tablet Take 1,000 mg by mouth 2 (two) times daily with a meal.  07/09/12  Yes [provider]  Multiple Vitamin (MULTIVITAMIN) tablet Take 1 tablet by mouth daily.   Yes [provider]  simvastatin (ZOCOR) 20 MG tablet Take 20 mg by mouth every evening.   Yes [provider]  solifenacin (VESICARE) 5 MG tablet Take 5 mg by mouth daily.   Yes [provider]  traZODone (DESYREL) 150 MG tablet Take 150 mg by mouth at bedtime.    Yes [provider]  furosemide (LASIX) 40 MG tablet Take 1 tablet (40 mg total) by mouth daily. Patient not taking: Reported on 10/25/2022 09/26/22   Evans Lance, MD      Allergies    Penicillins, Lisinopril, Macrobid [nitrofurantoin], Codeine, Levaquin [levofloxacin in d5w], and Sulfa drugs cross reactors    Review of Systems   Review of Systems  Physical Exam Updated Vital Signs BP (!) 178/72   Pulse 99   Temp (!) 97.5 F (36.4 C) (Oral)   Resp 15   Ht 5\' 6"  (1.676 m)   Wt 74.4 kg   SpO2 96%   BMI 26.47 kg/m  Physical Exam  ED Results / Procedures / Treatments   Labs (all labs ordered are listed, but only abnormal results are displayed) Labs Reviewed  CBC WITH DIFFERENTIAL/PLATELET - Abnormal; Notable for the following components:      Result Value   RBC 3.29 (*)    Hemoglobin 9.1 (*)    HCT 28.4 (*)    RDW 15.9 (*)    Platelets 118 (*)    Lymphs Abs 0.5 (*)    All other components within normal limits  COMPREHENSIVE METABOLIC PANEL - Abnormal; Notable for the following components:   CO2 21 (*)    Glucose, Bld 138 (*)    BUN 35 (*)    Creatinine, Ser 1.60 (*)    GFR,  Estimated 33 (*)    All other components within normal limits  PROTIME-INR - Abnormal; Notable for the following components:   Prothrombin Time 21.1 (*)    INR 1.8 (*)    All other components within normal limits  I-STAT CHEM 8, ED - Abnormal; Notable for the following components:   BUN 32 (*)    Creatinine, Ser 1.60 (*)    Glucose, Bld 132 (*)    Hemoglobin 9.5 (*)    HCT 28.0 (*)    All other components within normal limits  TROPONIN I (HIGH SENSITIVITY) - Abnormal; Notable for the following components:   Troponin I (High Sensitivity) 18 (*)    All other components within normal limits  CK  HEMOGLOBIN AND HEMATOCRIT, BLOOD  HEMOGLOBIN AND HEMATOCRIT, BLOOD  LACTATE DEHYDROGENASE  COMPREHENSIVE METABOLIC PANEL  BRAIN NATRIURETIC PEPTIDE  TYPE AND SCREEN  TROPONIN I (HIGH  SENSITIVITY)    EKG EKG Interpretation  Date/Time:  Wednesday October 25 2022 15:35:46 EDT Ventricular Rate:  87 PR Interval:  206 QRS Duration: 94 QT Interval:  410 QTC Calculation: 494 R Axis:   7 Text Interpretation: Sinus rhythm Anteroseptal infarct, old Confirmed by Milton Ferguson [phone]) on 10/25/2022 6:19:12 PM  Radiology CT ABDOMEN PELVIS W CONTRAST  Result Date: 10/25/2022 CLINICAL DATA:  Abdominal pain. EXAM: CT ABDOMEN AND PELVIS WITH CONTRAST TECHNIQUE: Multidetector CT imaging of the abdomen and pelvis was performed using the standard protocol following bolus administration of intravenous contrast. RADIATION DOSE REDUCTION: This exam was performed according to the departmental dose-optimization program which includes automated exposure control, adjustment of the mA and/or kV according to patient size and/or use of iterative reconstruction technique. CONTRAST:  45mL OMNIPAQUE IOHEXOL 300 MG/ML  SOLN COMPARISON:  CT abdomen pelvis dated 02/26/2018. FINDINGS: Motion limits the sensitivity of the exam. Lower chest: There is a large right pleural effusion with associated atelectasis. The heart is enlarged. Hepatobiliary: No focal liver abnormality is seen. Status post cholecystectomy. No biliary dilatation. Pancreas: Unremarkable. No pancreatic ductal dilatation or surrounding inflammatory changes. Spleen: The spleen is enlarged, similar to prior exam. Adrenals/Urinary Tract: Adrenal glands are unremarkable. Cysts in the left kidney measure up to 6 cm in size, unchanged from prior exam. No imaging follow-up is recommended for this finding. No focal lesions in the right kidney. No renal calculi on either side. No hydronephrosis. Bladder is unremarkable. Stomach/Bowel: Stomach is within normal limits. The appendix is not definitely identified, however no pericecal inflammatory changes are noted to suggest acute appendicitis. There is colonic diverticulosis without evidence of diverticulitis. No  evidence of bowel wall thickening, distention, or inflammatory changes. Vascular/Lymphatic: The bilateral ovarian veins are dilated and tortuous. No lymphadenopathy in the abdomen or pelvis. Reproductive: Status post hysterectomy. No adnexal masses. Other: No abdominal wall hernia or abnormality. No abdominopelvic ascites. Musculoskeletal: There is a chronic appearing compression fracture of L1 with 3 mm retropulsion into the central canal and approximately 50-75% height loss centrally. Degenerative changes are seen in the spine. IMPRESSION: 1. No acute process in the abdomen or pelvis. 2. Large right pleural effusion with associated atelectasis. 3. Dilated and tortuous bilateral ovarian veins can be seen in the setting of pelvic congestion syndrome. 4. Chronic appearing compression fracture of L1 with 3 mm retropulsion into the central canal. Electronically Signed   By: Zerita Boers M.D.   On: 10/25/2022 17:58   CT Cervical Spine Wo Contrast  Result Date: 10/25/2022 CLINICAL DATA:  Neck trauma (Age >= 65y) EXAM: CT CERVICAL  SPINE WITHOUT CONTRAST TECHNIQUE: Multidetector CT imaging of the cervical spine was performed without intravenous contrast. Multiplanar CT image reconstructions were also generated. RADIATION DOSE REDUCTION: This exam was performed according to the departmental dose-optimization program which includes automated exposure control, adjustment of the mA and/or kV according to patient size and/or use of iterative reconstruction technique. COMPARISON:  None Available. FINDINGS: Motion degraded exam. Alignment: Degenerative grade 1 anterolisthesis at C4-C5. Skull base and vertebrae: Extensive motion degradation which limits evaluation. Within this limitation, there is no evidence of cervical spine fracture. Soft tissues and spinal canal: No prevertebral fluid or swelling. No visible canal hematoma. Disc levels: Moderate degenerative disc disease at C5-C6. Mild-to-moderate multilevel facet  arthropathy. Upper chest: Layering right pleural effusion to the apex. Other: None. IMPRESSION: Motion degraded exam which limits evaluation. Within this limitation, there is no definite cervical spine fracture. If there is significant clinical concern, repeat exam should be considered. Electronically Signed   By: Maurine Simmering M.D.   On: 10/25/2022 17:54   CT Head Wo Contrast  Result Date: 10/25/2022 CLINICAL DATA:  Trauma EXAM: CT HEAD WITHOUT CONTRAST TECHNIQUE: Contiguous axial images were obtained from the base of the skull through the vertex without intravenous contrast. RADIATION DOSE REDUCTION: This exam was performed according to the departmental dose-optimization program which includes automated exposure control, adjustment of the mA and/or kV according to patient size and/or use of iterative reconstruction technique. COMPARISON:  Head CT 08/25/2022 FINDINGS: Brain: No evidence of acute infarction, hemorrhage, hydrocephalus, extra-axial collection or mass lesion/mass effect. Vascular: Atherosclerotic calcifications are present within the cavernous internal carotid arteries. Skull: Normal. Negative for fracture or focal lesion. Sinuses/Orbits: No acute finding. Other: None. IMPRESSION: No acute intracranial abnormality. Electronically Signed   By: Ronney Asters M.D.   On: 10/25/2022 17:49   DG Chest Port 1 View  Result Date: 10/25/2022 CLINICAL DATA:  Fall, pain, shortness of breath EXAM: PORTABLE CHEST 1 VIEW COMPARISON:  03/13/2014 FINDINGS: Redemonstrated left chest cardiac device with leads overlying the right atrium and ventricle. Enlarged cardiac silhouette, although the right heart border is incompletely imaged. Moderate right pleural effusion with associated atelectasis. No left pleural effusion. Increased interstitial prominence, possibly interstitial pulmonary edema. No acute osseous abnormalities. Degenerative changes in the bilateral shoulders. IMPRESSION: 1. Moderate right pleural  effusion with associated atelectasis. 2. Increased interstitial prominence, possibly interstitial pulmonary edema. 3. Concern for mild cardiomegaly, although the heart borders are incompletely seen. Electronically Signed   By: Merilyn Baba M.D.   On: 10/25/2022 16:15   DG Pelvis Portable  Result Date: 10/25/2022 CLINICAL DATA:  Fall with pain EXAM: PORTABLE PELVIS 1 VIEWS COMPARISON:  None Available. FINDINGS: There is no evidence of pelvic fracture or diastasis. No pelvic bone lesions are seen. Degenerative changes of the bilateral hips. Rounded soft tissue density projecting over the pelvis, likely distended urinary bladder. IMPRESSION: No acute osseous abnormality identified. Electronically Signed   By: Darrin Nipper M.D.   On: 10/25/2022 16:15    Procedures Procedures  {Document cardiac monitor, telemetry assessment procedure when appropriate:1}  Medications Ordered in ED Medications  pantoprazole (PROTONIX) 80 mg /NS 100 mL IVPB (has no administration in time range)  pantoprozole (PROTONIX) 80 mg /NS 100 mL infusion (has no administration in time range)  pantoprazole (PROTONIX) injection 40 mg (has no administration in time range)  HYDROcodone-acetaminophen (NORCO/VICODIN) 5-325 MG per tablet 1 tablet (has no administration in time range)  furosemide (LASIX) injection 40 mg (has no administration in time range)  simvastatin (ZOCOR) tablet 20 mg (has no administration in time range)  fesoterodine (TOVIAZ) tablet 4 mg (has no administration in time range)  traZODone (DESYREL) tablet 150 mg (has no administration in time range)  sodium chloride 0.9 % bolus 1,000 mL (1,000 mLs Intravenous New Bag/Given 10/25/22 1604)  pantoprazole (PROTONIX) injection 40 mg (40 mg Intravenous Given 10/25/22 1609)  iohexol (OMNIPAQUE) 300 MG/ML solution 80 mL (80 mLs Intravenous Contrast Given 10/25/22 1743)    ED Course/ Medical Decision Making/ A&P   {  CRITICAL CARE Performed by: Milton Ferguson Total  critical care time: 40 minutes Critical care time was exclusive of separately billable procedures and treating other patients. Critical care was necessary to treat or prevent imminent or life-threatening deterioration. Critical care was time spent personally by me on the following activities: development of treatment plan with patient and/or surrogate as well as nursing, discussions with consultants, evaluation of patient's response to treatment, examination of patient, obtaining history from patient or surrogate, ordering and performing treatments and interventions, ordering and review of laboratory studies, ordering and review of radiographic studies, pulse oximetry and re-evaluation of patient's condition.   Patient with melanotic stool.  She is hemodynamically stable.  She will be admitted to medicine and GI has been consulted.  Dr. Gala Romney suggested clear liquids only today and they will see the patient tomorrow Click here for ABCD2, HEART and other calculatorsREFRESH Note before signing :1}                          Medical Decision Making Amount and/or Complexity of Data Reviewed Labs: ordered. Radiology: ordered. ECG/medicine tests: ordered.  Risk Prescription drug management. Decision regarding hospitalization.   Lower GI bleed with fall  {Document critical care time when appropriate:1} {Document review of labs and clinical decision tools ie heart score, Chads2Vasc2 etc:1}  {Document your independent review of radiology images, and any outside records:1} {Document your discussion with family members, caretakers, and with consultants:1} {Document social determinants of health affecting pt's care:1} {Document your decision making why or why not admission, treatments were needed:1} Final Clinical Impression(s) / ED Diagnoses Final diagnoses:  Lower GI bleed    Rx / DC Orders ED Discharge Orders     None

## 2022-10-25 NOTE — ED Notes (Signed)
Patient transported to CT 

## 2022-10-25 NOTE — ED Triage Notes (Signed)
EMS states family came to take pt to get a heart cath that she was supposed to have this afternoon. Daughter called EMS because she found pt on the floor.  Pt does not know how long ago she fell but thinks it was this morning.   Pt was soiled with dark red BM that showed positive hemoccult.

## 2022-10-26 ENCOUNTER — Inpatient Hospital Stay (HOSPITAL_COMMUNITY): Payer: Medicare PPO

## 2022-10-26 ENCOUNTER — Encounter (HOSPITAL_COMMUNITY): Payer: Self-pay | Admitting: Internal Medicine

## 2022-10-26 DIAGNOSIS — I5033 Acute on chronic diastolic (congestive) heart failure: Secondary | ICD-10-CM

## 2022-10-26 DIAGNOSIS — W19XXXA Unspecified fall, initial encounter: Secondary | ICD-10-CM | POA: Diagnosis not present

## 2022-10-26 DIAGNOSIS — Y92009 Unspecified place in unspecified non-institutional (private) residence as the place of occurrence of the external cause: Secondary | ICD-10-CM

## 2022-10-26 DIAGNOSIS — I480 Paroxysmal atrial fibrillation: Secondary | ICD-10-CM | POA: Diagnosis not present

## 2022-10-26 DIAGNOSIS — I350 Nonrheumatic aortic (valve) stenosis: Secondary | ICD-10-CM

## 2022-10-26 DIAGNOSIS — K922 Gastrointestinal hemorrhage, unspecified: Secondary | ICD-10-CM | POA: Diagnosis not present

## 2022-10-26 DIAGNOSIS — I10 Essential (primary) hypertension: Secondary | ICD-10-CM | POA: Diagnosis not present

## 2022-10-26 DIAGNOSIS — N179 Acute kidney failure, unspecified: Secondary | ICD-10-CM

## 2022-10-26 LAB — ECHOCARDIOGRAM COMPLETE
AR max vel: 1.42 cm2
AV Area VTI: 1.44 cm2
AV Area mean vel: 1.38 cm2
AV Mean grad: 12.5 mmHg
AV Peak grad: 22.5 mmHg
Ao pk vel: 2.37 m/s
Area-P 1/2: 4.63 cm2
Height: 66 in
MV M vel: 5.58 m/s
MV Peak grad: 124.5 mmHg
S' Lateral: 4.3 cm
Weight: 2624 oz

## 2022-10-26 LAB — HEMOGLOBIN AND HEMATOCRIT, BLOOD
HCT: 28 % — ABNORMAL LOW (ref 36.0–46.0)
HCT: 28.7 % — ABNORMAL LOW (ref 36.0–46.0)
Hemoglobin: 8.8 g/dL — ABNORMAL LOW (ref 12.0–15.0)
Hemoglobin: 9.3 g/dL — ABNORMAL LOW (ref 12.0–15.0)

## 2022-10-26 LAB — GLUCOSE, CAPILLARY
Glucose-Capillary: 100 mg/dL — ABNORMAL HIGH (ref 70–99)
Glucose-Capillary: 139 mg/dL — ABNORMAL HIGH (ref 70–99)
Glucose-Capillary: 134 mg/dL — ABNORMAL HIGH (ref 70–99)
Glucose-Capillary: 123 mg/dL — ABNORMAL HIGH (ref 70–99)

## 2022-10-26 LAB — CBC
HCT: 25.7 % — ABNORMAL LOW (ref 36.0–46.0)
Hemoglobin: 8.2 g/dL — ABNORMAL LOW (ref 12.0–15.0)
MCH: 27.5 pg (ref 26.0–34.0)
MCHC: 31.9 g/dL (ref 30.0–36.0)
MCV: 86.2 fL (ref 80.0–100.0)
Platelets: 122 10*3/uL — ABNORMAL LOW (ref 150–400)
RBC: 2.98 MIL/uL — ABNORMAL LOW (ref 3.87–5.11)
RDW: 16.1 % — ABNORMAL HIGH (ref 11.5–15.5)
WBC: 5.5 10*3/uL (ref 4.0–10.5)
nRBC: 0 % (ref 0.0–0.2)

## 2022-10-26 LAB — BODY FLUID CELL COUNT WITH DIFFERENTIAL
Eos, Fluid: 1 %
Lymphs, Fluid: 82 %
Monocyte-Macrophage-Serous Fluid: 13 % — ABNORMAL LOW (ref 50–90)
Neutrophil Count, Fluid: 4 % (ref 0–25)
Total Nucleated Cell Count, Fluid: 268 cu mm (ref 0–1000)

## 2022-10-26 LAB — COMPREHENSIVE METABOLIC PANEL
ALT: 16 U/L (ref 0–44)
AST: 35 U/L (ref 15–41)
Albumin: 3 g/dL — ABNORMAL LOW (ref 3.5–5.0)
Alkaline Phosphatase: 64 U/L (ref 38–126)
Anion gap: 8 (ref 5–15)
BUN: 32 mg/dL — ABNORMAL HIGH (ref 8–23)
CO2: 23 mmol/L (ref 22–32)
Calcium: 8.8 mg/dL — ABNORMAL LOW (ref 8.9–10.3)
Chloride: 110 mmol/L (ref 98–111)
Creatinine, Ser: 1.54 mg/dL — ABNORMAL HIGH (ref 0.44–1.00)
GFR, Estimated: 35 mL/min — ABNORMAL LOW (ref >60)
Glucose, Bld: 106 mg/dL — ABNORMAL HIGH (ref 70–99)
Potassium: 3.8 mmol/L (ref 3.5–5.1)
Sodium: 141 mmol/L (ref 135–145)
Total Bilirubin: 1.5 mg/dL — ABNORMAL HIGH (ref 0.3–1.2)
Total Protein: 6.6 g/dL (ref 6.5–8.1)

## 2022-10-26 LAB — LACTATE DEHYDROGENASE, PLEURAL OR PERITONEAL FLUID: LD, Fluid: 81 U/L — ABNORMAL HIGH (ref 3–23)

## 2022-10-26 LAB — URINALYSIS, ROUTINE W REFLEX MICROSCOPIC
Bilirubin Urine: NEGATIVE
Glucose, UA: NEGATIVE mg/dL
Ketones, ur: NEGATIVE mg/dL
Leukocytes,Ua: NEGATIVE
Nitrite: NEGATIVE
Protein, ur: 100 mg/dL — AB
Specific Gravity, Urine: 1.012 (ref 1.005–1.030)
pH: 7 (ref 5.0–8.0)

## 2022-10-26 LAB — GRAM STAIN: Gram Stain: NONE SEEN

## 2022-10-26 LAB — PROTEIN, PLEURAL OR PERITONEAL FLUID: Total protein, fluid: <3 g/dL

## 2022-10-26 LAB — LACTATE DEHYDROGENASE: LDH: 171 U/L (ref 98–192)

## 2022-10-26 MED ORDER — BOOST / RESOURCE BREEZE PO LIQD CUSTOM: 1.0000 | Freq: Three times a day (TID) | ORAL | Status: DC

## 2022-10-26 MED ORDER — FUROSEMIDE 10 MG/ML IJ SOLN
40.0000 mg | Freq: Two times a day (BID) | INTRAMUSCULAR | Status: DC
  Administered 2022-10-26 – 2022-10-27 (×2): 40 mg via INTRAVENOUS
  Filled 2022-10-26 (×2): qty 4

## 2022-10-26 NOTE — Evaluation (Signed)
Physical Therapy Evaluation Patient Details Name: Tracey Koch MRN: [id number] DOB: 07-07-1946 Today's Date: 10/26/2022  History of Present Illness  Tracey Koch  is a 77 y.o. female, h/o symptomatic bradycardia, s/p PPM, HTN, rheumatoid arthritis, and DM , chronic  pain syndrome and aortic valve stenosis.  -Patient was brought by her daughter due to fall, patient currently lives by herself, husband unfortunately passed away just last month, she did report fall, she cannot recall exact events, but she denies any loss of consciousness, he remained on the floor for couple hours, no more, as family had been monitoring her with a camera installed in the house, and they were able to talk to her earlier today at 37 AM with no acute events, patient herself denies any fever, chills, chest pain, but she does report lightheadedness, and some dyspnea and wheezing, daughter was there to pick her up as she is supposed to have routine scheduled outpatient echo where she found her on the floor.   Clinical Impression  Patient demonstrates fair/good return for rolling to side and sitting up from side lying position using bed rail with Min assist, unsteady on feet and required use of RW for ambulation with good return demonstrated without loss of balance and limited mostly due to fatigue.  Patient tolerated sitting up in chair with her daughtr present in room after therapy.  Patient will benefit from continued skilled physical therapy in hospital and recommended venue below to increase strength, balance, endurance for safe ADLs and gait.         Recommendations for follow up therapy are one component of a multi-disciplinary discharge planning process, led by the attending physician.  Recommendations may be updated based on patient status, additional functional criteria and insurance authorization.  Follow Up Recommendations       Assistance Recommended at Discharge Set up Supervision/Assistance  Patient can  return home with the following  A little help with walking and/or transfers;A little help with bathing/dressing/bathroom;Assistance with cooking/housework;Help with stairs or ramp for entrance    Equipment Recommendations None recommended by PT  Recommendations for Other Services       Functional Status Assessment Patient has had a recent decline in their functional status and demonstrates the ability to make significant improvements in function in a reasonable and predictable amount of time.     Precautions / Restrictions Precautions Precautions: Fall Restrictions Weight Bearing Restrictions: No      Mobility  Bed Mobility Overal bed mobility: Needs Assistance Bed Mobility: Rolling, Sidelying to Sit Rolling: Min assist Sidelying to sit: Min assist       General bed mobility comments: slow labored movment, with increased low back pain    Transfers Overall transfer level: Needs assistance Equipment used: Rolling walker (2 wheels) Transfers: Sit to/from Stand, Bed to chair/wheelchair/BSC Sit to Stand: Min guard, Min assist   Step pivot transfers: Min guard, Min assist       General transfer comment: increased time, labored movement    Ambulation/Gait Ambulation/Gait assistance: Min assist Gait Distance (Feet): 30 Feet Assistive device: Rolling walker (2 wheels) Gait Pattern/deviations: Decreased step length - left, Decreased stance time - right, Decreased stride length Gait velocity: decreased     General Gait Details: slow labored cadence without loss of balance, limited mostly due to fatigue  Stairs            Wheelchair Mobility    Modified Rankin (Stroke Patients Only)       Balance Overall balance assessment: Needs assistance  Sitting-balance support: Feet supported, No upper extremity supported Sitting balance-Leahy Scale: Good Sitting balance - Comments: seated at EOB   Standing balance support: During functional activity, Bilateral upper  extremity supported Standing balance-Leahy Scale: Fair Standing balance comment: fair/good using RW                             Pertinent Vitals/Pain Pain Assessment Pain Assessment: 0-10 Pain Score: 5  Pain Location: low back Pain Descriptors / Indicators: Sore, Grimacing Pain Intervention(s): Limited activity within patient's tolerance, Monitored during session, Repositioned    Home Living Family/patient expects to be discharged to:: Private residence Living Arrangements: Alone Available Help at Discharge: Family;Available 24 hours/day Type of Home: House Home Access: Stairs to enter Entrance Stairs-Rails: None Entrance Stairs-Number of Steps: 1   Home Layout: One level Home Equipment: Conservation officer, nature (2 wheels);Cane - single point;Wheelchair - power;Wheelchair - manual;BSC/3in1;Shower seat - built in      Prior Function Prior Level of Function : Independent/Modified Independent             Mobility Comments: Hydrographic surveyor using SPC ADLs Comments: Independent     Hand Dominance   Dominant Hand: Right    Extremity/Trunk Assessment   Upper Extremity Assessment Upper Extremity Assessment: Generalized weakness    Lower Extremity Assessment Lower Extremity Assessment: Generalized weakness    Cervical / Trunk Assessment Cervical / Trunk Assessment: Normal  Communication   Communication: No difficulties  Cognition Arousal/Alertness: Awake/alert Behavior During Therapy: WFL for tasks assessed/performed Overall Cognitive Status: Within Functional Limits for tasks assessed                                          General Comments      Exercises     Assessment/Plan    PT Assessment Patient needs continued PT services  PT Problem List Decreased strength;Decreased activity tolerance;Decreased balance;Decreased mobility       PT Treatment Interventions DME instruction;Gait training;Stair training;Functional mobility  training;Therapeutic activities;Therapeutic exercise;Patient/family education;Balance training    PT Goals (Current goals can be found in the Care Plan section)  Acute Rehab PT Goals Patient Stated Goal: return home with family to assist PT Goal Formulation: With patient/family Time For Goal Achievement: 11/01/22 Potential to Achieve Goals: Good    Frequency Min 3X/week     Co-evaluation               AM-PAC PT "6 Clicks" Mobility  Outcome Measure Help needed turning from your back to your side while in a flat bed without using bedrails?: A Little Help needed moving from lying on your back to sitting on the side of a flat bed without using bedrails?: A Little Help needed moving to and from a bed to a chair (including a wheelchair)?: A Little Help needed standing up from a chair using your arms (e.g., wheelchair or bedside chair)?: A Little Help needed to walk in hospital room?: A Little Help needed climbing 3-5 steps with a railing? : A Lot 6 Click Score: 17    End of Session   Activity Tolerance: Patient tolerated treatment well;Patient limited by fatigue Patient left: in chair;with call bell/phone within reach;with family/visitor present Nurse Communication: Mobility status PT Visit Diagnosis: Unsteadiness on feet (R26.81);Other abnormalities of gait and mobility (R26.89);Muscle weakness (generalized) (M62.81)    Time: [id number] PT Time Calculation (  min) (ACUTE ONLY): 22 min   Charges:   PT Evaluation $PT Eval Moderate Complexity: 1 Mod PT Treatments $Therapeutic Activity: 8-22 mins        2:39 PM, 10/26/22 Lonell Grandchild, MPT Physical Therapist with [facility] [phone] office [phone] mobile phone

## 2022-10-26 NOTE — Assessment & Plan Note (Addendum)
Patient rate and rhythm controlled  Ok to resume anticoagulation per GI recommendations.

## 2022-10-26 NOTE — Plan of Care (Signed)
  Problem: Education: Goal: Knowledge of General Education information will improve Description Including pain rating scale, medication(s)/side effects and non-pharmacologic comfort measures Outcome: Progressing   

## 2022-10-26 NOTE — Progress Notes (Signed)
Progress Note   Patient: Tracey Koch [id number] DOB: [birth year] DOA: 10/25/2022     1 DOS: the patient was seen and examined on 10/26/2022   Brief hospital course: Mrs. Mathur was admitted to the hospital with the working diagnosis of gastrointestinal bleeding.   77 yo female with the past medical history of hypertension, rheumatoid arthritis, T2DM and aortic valve stenosis who presented after a mechanical fall. Apparently patient had a mechanical fall, her family found her on the floor. She does not recall the exact details of her fall. On her initial physical examination her blood pressure was 178/72, HR 99, RR 15 and 02 saturation 96%, lungs with decrease breath sounds at the right side and positive bilateral wheezing, heart with S1 and S2 present with no gallops, positive systolic murmur at the base, abdomen with no distention and no lower extremity edema.   Na 140, K 4,2 CL 108, bicarbonate 21, glucose 138, bun 35 cr 1,60 BNP 680 High sensitive troponin 13 and 18  Wbc 5,7 hgb 9,1 plt 118  Hemoccult positive stool.   Urine analysis SG 1,012, protein 100, negative leukocytes, large hgb dipstick but 0-5 rbc   Head and cervical spine CT with no acute changes.  CT abdomen and pelvis with no acute process. Large right pleural effusion with atelectasis. Chronic appearing compression fracture of L1 with 3 mm retropulsion into the central canal.   Chest radiograph with cardiomegaly, right large pleural effusion, pacemaker in place with one atrial lead and one right ventricular lead.   EKG 87 bpm, normal axis, normal intervals, sinus rhythm with q wave in V1 and V2 with no significant ST segment or T wave changes.   Patient was placed on IV pantoprazole for suspected upper gastrointestinal bleed. 03/28 right thoracentesis 1.2 L removed.   Assessment and Plan: * Fall at home, initial encounter Patient had a fall at home, not clear mechanisms,  Likely multifactorial from fluid overload  and symptomatic anemia.   Plan for PT and OT.  Correct anemia, and will need diuresis.   Acute on chronic diastolic CHF (congestive heart failure) (HCC) Echocardiogram with preserved LV systolic function with EF 60 to 65%, No LVH, elevated LA pressure, with moderate LA dilatation, mild mitral regurgitation, mild to moderate aortic stenosis.   Positive right pleural effusion sp thoracentesis 1,4 L. Transudate fluid per protein ratio.   Plan to continue with IV furosemide 40 mg IV q12 hrs to target further negative fluid balance.  Continue telemetry monitoring.   Essential hypertension Continue blood pressure control with amlodipine.   Paroxysmal atrial fibrillation (HCC) Patient rate and rhythm controlled  Holding anticoagulation due to symptomatic anemia.   Acute renal failure superimposed on chronic kidney disease ([place]) Suspect CKD stage 3b  Renal function with serum cr at 1,54 with K at 3,8 and serum bicarbonate at 23. Na 141 and BUN 32.  Continue diuresis with furosemide.  Follow up renal function in am.   GI bleed Continue pantoprazole. Holding anticoagulation, and follow up with GI recommendations.  Possible endoscopic procedure on this admission.   Type 2 diabetes mellitus without complication (HCC) Continue glucose cover and monitoring with insulin sliding scale.    Rheumatoid arthritis ([place]) Follow up with PT and OT.         Subjective: Patient with improvement in her dyspnea but not back to baseline, continue to be very weak and deconditioned, no chest pain,.   Physical Exam: Vitals:   10/26/22 0840 10/26/22 1100 10/26/22 1120 10/26/22  1153  BP:  (!) 154/63 (!) 163/59 (!) 160/58  Pulse: 72 84 87 79  Resp:  20 20   Temp:  98.4 F (36.9 C)    TempSrc: Axillary Oral    SpO2:  97% 96% 98%  Weight:      Height:       Neurology awake and alert ENT with positive pallor Cardiovascular with S1 and S2 present and rhythmic with no gallops, or rubs,  positive murmur at the base Moderate JVD Respiratory with no wheezing, or rhonchi, bilateral diffuse rales. Abdomen with no distention  Positive lower extremity edema Data Reviewed:    Family Communication: I spoke with patient's daughter at the bedside, we talked in detail about patient's condition, plan of care and prognosis and all questions were addressed.   Disposition: Status is: Inpatient Remains inpatient appropriate because: IV diuresis and GI evaluation   Planned Discharge Destination: Home      Author: Tawni Millers, MD 10/26/2022 3:03 PM  For on call review [email or web address].

## 2022-10-26 NOTE — H&P (View-Only) (Signed)
  Gastroenterology Consult   Referring Provider: No ref. provider found Primary Care Physician:  Sasser, Paul W, MD Primary Gastroenterologist:  previously unassigned  Patient ID: Tracey Koch; [id number]; 05/22/1946   Admit date: 10/25/2022  LOS: 1 day   Date of Consultation: 10/26/2022  Reason for Consultation:  melena/heme + stool on Eliquis    History of Present Illness   Tracey Koch is a 77 y.o. female with history of diabetes, symptomatic bradycardia status post pacemaker, aortic stenosis, history of A-fib on Eliquis, rheumatoid arthritis presenting to the emergency department via EMS after patient being found in the floor for undisclosed amount of time, soiled with dark red BM that was heme positive.  Patient's family found her when they came to pick her up for routine outpatient echocardiogram. Patient reported a fall, denies loss of consciousness, reports being on the floor for a couple hours.   In the ED: Patient was noted to have melena, hemoglobin 9.1, platelets 118,000, creatinine 1.6, BNP 680, INR 1.8, repeat hemoglobin 8.4.  CT abdomen pelvis with contrast yesterday showed large right pleural effusion with associated atelectasis, enlarged heart, enlarged spleen, liver unremarkable, chronic appearing compression fracture of L1 with 3 mm retropulsion into the central canal.  Today her total bilirubin 1.5, albumin 3, alk phos 64, AST 35, ALT 16, creatinine 1.54, BUN 32, hemoglobin 8.2, platelets 122,000.  Patient states that she fell yesterday due to weakness.  Currently living alone, spouse recently passed.  Supportive family.  Patient states she has not noticed any black or bloody stools prior to yesterday.  She takes MiraLAX to maintain regularity.  Initially states she has not had any abdominal pain although her granddaughter states she has been complaining of some lower abdominal pain.  Daughter reports that she has been having issues with shortness of breath of the  past several weeks felt to be related to excess fluid.  She was started on Lasix 40 mg daily initially and dropped 12-17 pounds.  Lasix was then drop to 20 mg daily.  Over the past couple of days her breathing has worsened.  She denies any nausea or vomiting, heartburn.  She denies any previous colonoscopy or endoscopy.  Family reports her last dose of Eliquis was Tuesday evening.  Prior to Admission medications   Medication Sig Start Date End Date Taking? Authorizing Provider  albuterol (VENTOLIN HFA) 108 (90 Base) MCG/ACT inhaler Inhale into the lungs every 6 (six) hours as needed for wheezing or shortness of breath.   Yes [provider]  alendronate (FOSAMAX) 70 MG tablet Take 70 mg by mouth once a week. 10/11/22  Yes [provider]  apixaban (ELIQUIS) 5 MG TABS tablet Take 1 tablet (5 mg total) by mouth 2 (two) times daily. 02/22/17  Yes Taylor, Gregg W, MD  fexofenadine (ALLEGRA) 180 MG tablet Take 180 mg by mouth daily. 07/19/20  Yes [provider]  furosemide (LASIX) 20 MG tablet Take 20 mg by mouth daily. 10/12/22  Yes [provider]  HYDROcodone-acetaminophen (NORCO) 7.5-325 MG tablet Take 1 tablet by mouth 2 (two) times daily. 06/14/16  Yes [provider]  losartan (COZAAR) 50 MG tablet Take 1 tablet (50 mg total) by mouth daily. 04/04/18 10/25/22 Yes Taylor, Gregg W, MD  metFORMIN (GLUCOPHAGE) 500 MG tablet Take 1,000 mg by mouth 2 (two) times daily with a meal.  07/09/12  Yes [provider]  Multiple Vitamin (MULTIVITAMIN) tablet Take 1 tablet by mouth daily.   Yes [provider]    simvastatin (ZOCOR) 20 MG tablet Take 20 mg by mouth every evening.   Yes [provider]  solifenacin (VESICARE) 5 MG tablet Take 5 mg by mouth daily.   Yes [provider]  traZODone (DESYREL) 150 MG tablet Take 150 mg by mouth at bedtime.   Yes [provider]        Taylor, Gregg W, MD    Current  Facility-Administered Medications  Medication Dose Route Frequency Provider Last Rate Last Admin   albuterol (PROVENTIL) (2.5 MG/3ML) 0.083% nebulizer solution 2.5 mg  2.5 mg Nebulization Q2H PRN Elgergawy, Dawood S, MD   2.5 mg at 10/26/22 0623   amLODipine (NORVASC) tablet 5 mg  5 mg Oral Daily Elgergawy, Dawood S, MD       fesoterodine (TOVIAZ) tablet 4 mg  4 mg Oral Daily Elgergawy, Dawood S, MD       hydrALAZINE (APRESOLINE) injection 5 mg  5 mg Intravenous Q6H PRN Elgergawy, Dawood S, MD       HYDROcodone-acetaminophen (NORCO/VICODIN) 5-325 MG per tablet 1 tablet  1 tablet Oral Q6H PRN Elgergawy, Dawood S, MD   1 tablet at 10/25/22 2210   insulin aspart (novoLOG) injection 0-5 Units  0-5 Units Subcutaneous QHS Elgergawy, Dawood S, MD       insulin aspart (novoLOG) injection 0-9 Units  0-9 Units Subcutaneous TID WC Elgergawy, Dawood S, MD       [START ON 10/29/2022] pantoprazole (PROTONIX) injection 40 mg  40 mg Intravenous Q12H Elgergawy, Dawood S, MD       pantoprozole (PROTONIX) 80 mg /NS 100 mL infusion  8 mg/hr Intravenous Continuous Elgergawy, Dawood S, MD 10 mL/hr at 10/26/22 0009 8 mg/hr at 10/26/22 0009   simvastatin (ZOCOR) tablet 20 mg  20 mg Oral QPM Elgergawy, Dawood S, MD   20 mg at 10/25/22 2210   traZODone (DESYREL) tablet 150 mg  150 mg Oral QHS Elgergawy, Dawood S, MD   150 mg at 10/25/22 2210    Allergies as of 10/25/2022 - Review Complete 10/25/2022  Allergen Reaction Noted   Penicillins Shortness Of Breath, Itching, Swelling, and Rash 07/10/2011   Lisinopril Cough 11/26/2017   Macrobid [nitrofurantoin] Hives, Diarrhea, Itching, and Nausea And Vomiting 06/19/2016   Codeine Itching, Swelling, and Rash 07/10/2011   Levaquin [levofloxacin in d5w] Itching, Swelling, and Other (See Comments) 03/13/2014   Sulfa drugs cross reactors Itching, Swelling, and Rash 07/10/2011    Past Medical History:  Diagnosis Date   Anemia    takes Folic Acid daily   Arthritis    takes  Plaquenil daily and Methotrexate 2 times a week   Cough    takes Allegra daily   Depression    takes Prozac at bedtime   Diabetes mellitus without complication (HCC)    takes Metformin daily   Family history of anesthesia complication    sister post op N/V   History of blood transfusion    History of UTI    Hyperlipidemia    takes Simvastatin nightly   Joint pain    Joint swelling    Pacemaker    Seasonal allergies    takes Allegra daily   Sinoatrial node dysfunction (HCC)    has ICD    Past Surgical History:  Procedure Laterality Date   ABDOMINAL HYSTERECTOMY     CHOLECYSTECTOMY     COLONOSCOPY     EP IMPLANTABLE DEVICE N/A 08/06/2015   Procedure: PPM Generator Changeout;  Surgeon: Gregg W Taylor, MD;  Location:   [facility];  Service: Cardiovascular;  Laterality: N/A;   INSERT / REPLACE / REMOVE PACEMAKER     right knee replacement     TOTAL KNEE ARTHROPLASTY Left 05/06/2013   Dr Whitfield   TOTAL KNEE ARTHROPLASTY Left 05/06/2013   Procedure: TOTAL KNEE ARTHROPLASTY;  Surgeon: Peter W Whitfield, MD;  Location: [facility];  Service: Orthopedics;  Laterality: Left;    Family History  Problem Relation Age of Onset   COPD Mother    Stroke Father    Heart attack Father    Heart failure Sister    Diabetes Sister    CAD Brother    Diabetes Brother    Multiple sclerosis Sister    CAD Brother    Diabetes Brother    Renal cancer Brother     Social History   Socioeconomic History   Marital status: Married    Spouse name: Not on file   Number of children: Not on file   Years of education: Not on file   Highest education level: Not on file  Occupational History   Not on file  Tobacco Use   Smoking status: Former    Packs/day: 1.00    Years: 52.00    Additional pack years: 0.00    Total pack years: 52.00    Types: Cigarettes    Start date: 03/01/1962    Quit date: 11/28/2020    Years since quitting: 1.9   Smokeless tobacco: Never  Vaping Use   Vaping Use:  Never used  Substance and Sexual Activity   Alcohol use: No    Alcohol/week: 0.0 standard drinks of alcohol   Drug use: No   Sexual activity: Not Currently    Birth control/protection: Surgical  Other Topics Concern   Not on file  Social History Narrative   Not on file   Social Determinants of Health   Financial Resource Strain: Not on file  Food Insecurity: No Food Insecurity (10/25/2022)   Hunger Vital Sign    Worried About Running Out of Food in the Last Year: Never true    Ran Out of Food in the Last Year: Never true  Transportation Needs: Unmet Transportation Needs (10/25/2022)   PRAPARE - Transportation    Lack of Transportation (Medical): Yes    Lack of Transportation (Non-Medical): Yes  Physical Activity: Not on file  Stress: Not on file  Social Connections: Not on file  Intimate Partner Violence: Not At Risk (10/25/2022)   Humiliation, Afraid, Rape, and Kick questionnaire    Fear of Current or Ex-Partner: No    Emotionally Abused: No    Physically Abused: No    Sexually Abused: No     Review of System:   General: Negative for anorexia, weight loss, fever, chills, fatigue, +weakness. Eyes: Negative for vision changes.  ENT: Negative for hoarseness, difficulty swallowing , nasal congestion. CV: Negative for chest pain, angina, palpitations, dyspnea on exertion, +peripheral edema.  Respiratory: +dyspnea at rest, +dyspnea on exertion. No cough, sputum, wheezing.  GI: See history of present illness. GU:  Negative for dysuria, hematuria, urinary incontinence, urinary frequency, nocturnal urination.  MS: + for joint pain, low back pain.  Derm: Negative for rash or itching.  Neuro: Negative for weakness, abnormal sensation, seizure, frequent headaches, memory loss, confusion.  Psych: Negative for anxiety, depression, suicidal ideation, hallucinations.  Endo: Negative for unusual weight change.  Heme: Negative for bruising or bleeding. Allergy: Negative for rash or  hives.      Physical   Examination:   Vital signs in last 24 hours: Temp:  [97.5 F (36.4 C)-98.9 F (37.2 C)] 98.9 F (37.2 C) (03/28 0741) Pulse Rate:  [79-99] 79 (03/28 0741) Resp:  [15-22] 21 (03/28 0741) BP: (152-178)/(54-85) 171/54 (03/28 0741) SpO2:  [93 %-98 %] 96 % (03/28 0741) Weight:  [74.4 kg] 74.4 kg (03/27 1535) Last BM Date : 10/24/22  General: frail elderly female in NAD. Daughter and Granddaughter at bedside.  Head: Normocephalic, atraumatic.   Eyes: Conjunctiva pale, no icterus. Mouth: Oropharyngeal mucosa moist and pink , no lesions erythema or exudate. Neck: Supple without thyromegaly, masses, or lymphadenopathy.  Lungs: diminished breath sounds on the right base otherwise clear to auscultation.  Heart: Regular rate and rhythm, no murmurs rubs or gallops.  Abdomen: Bowel sounds are normal,  nondistended, no hepatosplenomegaly or masses, no abdominal bruits or hernia , no rebound or guarding. Lower abdominal pain noted below umbilicus Rectal: not performed Extremities: 2-3+ pitting edema in bilateral lower legs, 2+ to bilateral thighs. No clubbing, deformity.  Neuro: Alert and oriented x 4 , grossly normal neurologically.  Skin: Warm and dry, no rash or jaundice.   Psych: Alert and cooperative, normal mood. Flat affect.        Intake/Output from previous day: 03/27 0701 - 03/28 0700 In: -  Out: 1200 [Urine:1200] Intake/Output this shift: No intake/output data recorded.  Lab Results:   CBC Recent Labs    10/25/22 1609 10/25/22 2201 10/26/22 0442  WBC 5.7  --  5.5  HGB 9.1* 8.4* 8.2*  HCT 28.4* 26.0* 25.7*  MCV 86.3  --  86.2  PLT 118*  --  122*   BMET Recent Labs    10/25/22 1606 10/25/22 1609 10/26/22 0442  NA 144 140 141  K 4.3 4.2 3.8  CL 110 108 110  CO2  --  21* 23  GLUCOSE 132* 138* 106*  BUN 32* 35* 32*  CREATININE 1.60* 1.60* 1.54*  CALCIUM  --  9.0 8.8*   LFT Recent Labs    10/25/22 1609 10/26/22 0442  BILITOT 1.1 1.5*   ALKPHOS 80 64  AST 36 35  ALT 19 16  PROT 7.6 6.6  ALBUMIN 3.5 3.0*    Lipase No results for input(s): "LIPASE" in the last 72 hours.  PT/INR Recent Labs    10/25/22 1646  LABPROT 21.1*  INR 1.8*     Hepatitis Panel No results for input(s): "HEPBSAG", "HCVAB", "HEPAIGM", "HEPBIGM" in the last 72 hours.   Imaging Studies:   CT ABDOMEN PELVIS W CONTRAST  Result Date: 10/25/2022 CLINICAL DATA:  Abdominal pain. EXAM: CT ABDOMEN AND PELVIS WITH CONTRAST TECHNIQUE: Multidetector CT imaging of the abdomen and pelvis was performed using the standard protocol following bolus administration of intravenous contrast. RADIATION DOSE REDUCTION: This exam was performed according to the departmental dose-optimization program which includes automated exposure control, adjustment of the mA and/or kV according to patient size and/or use of iterative reconstruction technique. CONTRAST:  80mL OMNIPAQUE IOHEXOL 300 MG/ML  SOLN COMPARISON:  CT abdomen pelvis dated 02/26/2018. FINDINGS: Motion limits the sensitivity of the exam. Lower chest: There is a large right pleural effusion with associated atelectasis. The heart is enlarged. Hepatobiliary: No focal liver abnormality is seen. Status post cholecystectomy. No biliary dilatation. Pancreas: Unremarkable. No pancreatic ductal dilatation or surrounding inflammatory changes. Spleen: The spleen is enlarged, similar to prior exam. Adrenals/Urinary Tract: Adrenal glands are unremarkable. Cysts in the left kidney measure up to 6 cm in size, unchanged from   prior exam. No imaging follow-up is recommended for this finding. No focal lesions in the right kidney. No renal calculi on either side. No hydronephrosis. Bladder is unremarkable. Stomach/Bowel: Stomach is within normal limits. The appendix is not definitely identified, however no pericecal inflammatory changes are noted to suggest acute appendicitis. There is colonic diverticulosis without evidence of  diverticulitis. No evidence of bowel wall thickening, distention, or inflammatory changes. Vascular/Lymphatic: The bilateral ovarian veins are dilated and tortuous. No lymphadenopathy in the abdomen or pelvis. Reproductive: Status post hysterectomy. No adnexal masses. Other: No abdominal wall hernia or abnormality. No abdominopelvic ascites. Musculoskeletal: There is a chronic appearing compression fracture of L1 with 3 mm retropulsion into the central canal and approximately 50-75% height loss centrally. Degenerative changes are seen in the spine. IMPRESSION: 1. No acute process in the abdomen or pelvis. 2. Large right pleural effusion with associated atelectasis. 3. Dilated and tortuous bilateral ovarian veins can be seen in the setting of pelvic congestion syndrome. 4. Chronic appearing compression fracture of L1 with 3 mm retropulsion into the central canal. Electronically Signed   By: Tyler  Litton M.D.   On: 10/25/2022 17:58   CT Cervical Spine Wo Contrast  Result Date: 10/25/2022 CLINICAL DATA:  Neck trauma (Age >= 65y) EXAM: CT CERVICAL SPINE WITHOUT CONTRAST TECHNIQUE: Multidetector CT imaging of the cervical spine was performed without intravenous contrast. Multiplanar CT image reconstructions were also generated. RADIATION DOSE REDUCTION: This exam was performed according to the departmental dose-optimization program which includes automated exposure control, adjustment of the mA and/or kV according to patient size and/or use of iterative reconstruction technique. COMPARISON:  None Available. FINDINGS: Motion degraded exam. Alignment: Degenerative grade 1 anterolisthesis at C4-C5. Skull base and vertebrae: Extensive motion degradation which limits evaluation. Within this limitation, there is no evidence of cervical spine fracture. Soft tissues and spinal canal: No prevertebral fluid or swelling. No visible canal hematoma. Disc levels: Moderate degenerative disc disease at C5-C6. Mild-to-moderate  multilevel facet arthropathy. Upper chest: Layering right pleural effusion to the apex. Other: None. IMPRESSION: Motion degraded exam which limits evaluation. Within this limitation, there is no definite cervical spine fracture. If there is significant clinical concern, repeat exam should be considered. Electronically Signed   By: Jacob  Kahn M.D.   On: 10/25/2022 17:54   CT Head Wo Contrast  Result Date: 10/25/2022 CLINICAL DATA:  Trauma EXAM: CT HEAD WITHOUT CONTRAST TECHNIQUE: Contiguous axial images were obtained from the base of the skull through the vertex without intravenous contrast. RADIATION DOSE REDUCTION: This exam was performed according to the departmental dose-optimization program which includes automated exposure control, adjustment of the mA and/or kV according to patient size and/or use of iterative reconstruction technique. COMPARISON:  Head CT 08/25/2022 FINDINGS: Brain: No evidence of acute infarction, hemorrhage, hydrocephalus, extra-axial collection or mass lesion/mass effect. Vascular: Atherosclerotic calcifications are present within the cavernous internal carotid arteries. Skull: Normal. Negative for fracture or focal lesion. Sinuses/Orbits: No acute finding. Other: None. IMPRESSION: No acute intracranial abnormality. Electronically Signed   By: Amy  Guttmann M.D.   On: 10/25/2022 17:49   DG Chest Port 1 View  Result Date: 10/25/2022 CLINICAL DATA:  Fall, pain, shortness of breath EXAM: PORTABLE CHEST 1 VIEW COMPARISON:  03/13/2014 FINDINGS: Redemonstrated left chest cardiac device with leads overlying the right atrium and ventricle. Enlarged cardiac silhouette, although the right heart border is incompletely imaged. Moderate right pleural effusion with associated atelectasis. No left pleural effusion. Increased interstitial prominence, possibly interstitial pulmonary edema. No acute   osseous abnormalities. Degenerative changes in the bilateral shoulders. IMPRESSION: 1. Moderate  right pleural effusion with associated atelectasis. 2. Increased interstitial prominence, possibly interstitial pulmonary edema. 3. Concern for mild cardiomegaly, although the heart borders are incompletely seen. Electronically Signed   By: Alison  Vasan M.D.   On: 10/25/2022 16:15   DG Pelvis Portable  Result Date: 10/25/2022 CLINICAL DATA:  Fall with pain EXAM: PORTABLE PELVIS 1 VIEWS COMPARISON:  None Available. FINDINGS: There is no evidence of pelvic fracture or diastasis. No pelvic bone lesions are seen. Degenerative changes of the bilateral hips. Rounded soft tissue density projecting over the pelvis, likely distended urinary bladder. IMPRESSION: No acute osseous abnormality identified. Electronically Signed   By: Limin  Xu M.D.   On: 10/25/2022 16:15   CUP PACEART INCLINIC DEVICE CHECK  Result Date: 09/26/2022 Pacemaker check in clinic. Normal device function. Thresholds, sensing, impedances consistent with previous measurements. Device programmed to maximize longevity. AT/AF burden 0%, 1 HVR appears 8secs of NSVT occurring in June of 2023, none recent. Device  programmed at appropriate safety margins. PROGRAMMED Adaptive off on pacing threshold and set fixed at: RA - 1.5@.4 RV - 2.0@.4 Histogram distribution appropriate for patient activity level. Device programmed to optimize intrinsic conduction. Estimated longevity 8.5 YEARS. Patient enrolled in remote follow-up. Patient education completed.Mandy Wagoner, RN [4 week]  Assessment:   Pleasant 76-year-old female with history of diabetes, symptomatic bradycardia status post pacemaker, aortic stenosis, history of A-fib on Eliquis, rheumatoid arthritis presenting to the emergency department via EMS after patient being found in the floor for undisclosed amount of time, soiled with dark red BM that was heme positive.  Patient's family found her when they came to pick her up for routine outpatient echocardiogram. Patient reported a fall, denies loss  of consciousness, reports being on the floor for a couple hours.  GI consulted for melena, heme positive stool.  Melena: Noted by ED staff, heme positive.  Reported dark blood in BM upon arrival by EMS at the home.  Recent baseline labs unavailable.  In the ED her initial hemoglobin was 9.5, down to 8.2 today.  CT imaging showed splenomegaly but no evidence of liver abnormality.  She has mild thrombocytopenia.  Last dose of Eliquis Tuesday evening.  She was started on pantoprazole drip.  No BM since admission.  Would recommend upper endoscopy this admission but currently she has some resting shortness of breath in the setting of a large right pleural effusion requiring thoracentesis today.  Also needs additional washout of Eliquis in the setting of renal insufficiency.  Plan:   Continue pantoprazole drip. Continue clear liquid diet, n.p.o. after midnight. Serial H&H, transfuse as needed. Tentatively plan for upper endoscopy tomorrow if stable from cardiopulmonary standpoint.   Follow-up thoracentesis, echocardiogram.   LOS: 1 day   We would like to thank you for the opportunity to participate in the care of Jesslynn Wojtkiewicz.  Tessia Kassin S. Kosisochukwu Goldberg, PA-C [facility] [phone] 3/28/20248:12 AM   

## 2022-10-26 NOTE — Progress Notes (Signed)
Initial Nutrition Assessment  DOCUMENTATION CODES:      INTERVENTION:  RD will follow diet advancement  NUTRITION DIAGNOSIS:   Inadequate oral intake related to acute illness as evidenced by  (patient on clear liquid diet. NPO after midnight).   GOAL:  Patient will meet greater than or equal to 90% of their needs  MONITOR:  Diet advancement, Labs, Weight trends  REASON FOR ASSESSMENT:   Malnutrition Screening Tool    ASSESSMENT: Patient is a 77 yo female with history of DM, depression, anemia, RA and pacemaker. Presents with complaint of GIB. Fall at home. Acute on chronic CHF, Right Pleural effusion.  Patient is status post thoracentesis today 1.2 Liters removed per radiology.   Patient on clear liquids currently. Daughter is bedside. Patient likes the [employer] and drinks sprites at home. Patient husband passed away in 10-22-22 and her usual eating has been affected per daughter. Poor dentition. When diet is advanced recommend mechanical soft (dysphagia 3).  NPO after midnight for GI endoscopy tomorrow morning around 1100 per patient.   Reports recent acute weight loss as desirable and says that primary MD had increased her lasix and weight decreased ~12 lb. Weight: acute loss (4.5 kg) 5.8% x 1 month which is significant. Currently she weighs 74.4 kg.   Medications reviewed and include:  insulin aspart (0-5 units at bedtime) and (0-9 units with meals).     Latest Ref Rng & Units 10/26/2022    4:42 AM 10/25/2022    4:09 PM 10/25/2022    4:06 PM  BMP  Glucose 70 - 99 mg/dL 106  138  132   BUN 8 - 23 mg/dL 32  35  32   Creatinine 0.44 - 1.00 mg/dL 1.54  1.60  1.60   Sodium 135 - 145 mmol/L 141  140  144   Potassium 3.5 - 5.1 mmol/L 3.8  4.2  4.3   Chloride 98 - 111 mmol/L 110  108  110   CO2 22 - 32 mmol/L 23  21    Calcium 8.9 - 10.3 mg/dL 8.8  9.0        NUTRITION - FOCUSED PHYSICAL EXAM:  Flowsheet Row Most Recent Value  Orbital Region No depletion  Upper Arm  Region No depletion  Thoracic and Lumbar Region No depletion  Buccal Region No depletion  Temple Region Mild depletion  Clavicle Bone Region No depletion  Clavicle and Acromion Bone Region Mild depletion  Dorsal Hand Mild depletion  Edema (RD Assessment) Moderate  Hair Reviewed  Eyes Reviewed  Mouth Reviewed  Skin Reviewed  Nails Reviewed       Diet Order:   Diet Order             Diet NPO time specified Except for: Sips with Meds  Diet effective midnight           Diet clear liquid Room service appropriate? Yes; Fluid consistency: Thin  Diet effective now                   EDUCATION NEEDS:  Education needs have been addressed  Skin:  Skin Assessment: Reviewed RN Assessment  Last BM:  3/26  Height:   Ht Readings from Last 1 Encounters:  10/25/22 5\' 6"  (1.676 m)    Weight:   Wt Readings from Last 1 Encounters:  10/25/22 74.4 kg    Ideal Body Weight:   59 kg  BMI:  Body mass index is 26.47 kg/m.  Estimated Nutritional Needs:  Kcal:  1700-1800  Protein:  80-85 gr  Fluid:  1.7-1.8 liters daily  Colman Cater MS,RD,CSG,LDN Contact: Shea Evans

## 2022-10-26 NOTE — Consult Note (Signed)
Gastroenterology Consult   Referring Provider: No ref. provider found Primary Care Physician:  Manon Hilding, MD Primary Gastroenterologist:  previously unassigned  Patient ID: Tracey Koch; [id number]; 17-Jun-1946   Admit date: 10/25/2022  LOS: 1 day   Date of Consultation: 10/26/2022  Reason for Consultation:  melena/heme + stool on Eliquis    History of Present Illness   Tracey Koch is a 77 y.o. female with history of diabetes, symptomatic bradycardia status post pacemaker, aortic stenosis, history of A-fib on Eliquis, rheumatoid arthritis presenting to the emergency department via EMS after patient being found in the floor for undisclosed amount of time, soiled with dark red BM that was heme positive.  Patient's family found her when they came to pick her up for routine outpatient echocardiogram. Patient reported a fall, denies loss of consciousness, reports being on the floor for a couple hours.   In the ED: Patient was noted to have melena, hemoglobin 9.1, platelets 118,000, creatinine 1.6, BNP 680, INR 1.8, repeat hemoglobin 8.4.  CT abdomen pelvis with contrast yesterday showed large right pleural effusion with associated atelectasis, enlarged heart, enlarged spleen, liver unremarkable, chronic appearing compression fracture of L1 with 3 mm retropulsion into the central canal.  Today her total bilirubin 1.5, albumin 3, alk phos 64, AST 35, ALT 16, creatinine 1.54, BUN 32, hemoglobin 8.2, platelets 122,000.  Patient states that she fell yesterday due to weakness.  Currently living alone, spouse recently passed.  Supportive family.  Patient states she has not noticed any black or bloody stools prior to yesterday.  She takes MiraLAX to maintain regularity.  Initially states she has not had any abdominal pain although her granddaughter states she has been complaining of some lower abdominal pain.  Daughter reports that she has been having issues with shortness of breath of the  past several weeks felt to be related to excess fluid.  She was started on Lasix 40 mg daily initially and dropped 12-17 pounds.  Lasix was then drop to 20 mg daily.  Over the past couple of days her breathing has worsened.  She denies any nausea or vomiting, heartburn.  She denies any previous colonoscopy or endoscopy.  Family reports her last dose of Eliquis was Tuesday evening.  Prior to Admission medications   Medication Sig Start Date End Date Taking? Authorizing Provider  albuterol (VENTOLIN HFA) 108 (90 Base) MCG/ACT inhaler Inhale into the lungs every 6 (six) hours as needed for wheezing or shortness of breath.   Yes [provider]  alendronate (FOSAMAX) 70 MG tablet Take 70 mg by mouth once a week. 10/11/22  Yes [provider]  apixaban (ELIQUIS) 5 MG TABS tablet Take 1 tablet (5 mg total) by mouth 2 (two) times daily. 02/22/17  Yes Evans Lance, MD  fexofenadine (ALLEGRA) 180 MG tablet Take 180 mg by mouth daily. 07/19/20  Yes [provider]  furosemide (LASIX) 20 MG tablet Take 20 mg by mouth daily. 10/12/22  Yes [provider]  HYDROcodone-acetaminophen (NORCO) 7.5-325 MG tablet Take 1 tablet by mouth 2 (two) times daily. 06/14/16  Yes [provider]  losartan (COZAAR) 50 MG tablet Take 1 tablet (50 mg total) by mouth daily. 04/04/18 10/25/22 Yes Evans Lance, MD  metFORMIN (GLUCOPHAGE) 500 MG tablet Take 1,000 mg by mouth 2 (two) times daily with a meal.  07/09/12  Yes [provider]  Multiple Vitamin (MULTIVITAMIN) tablet Take 1 tablet by mouth daily.   Yes [provider]  simvastatin (ZOCOR) 20 MG tablet Take 20 mg by mouth every evening.   Yes [provider]  solifenacin (VESICARE) 5 MG tablet Take 5 mg by mouth daily.   Yes [provider]  traZODone (DESYREL) 150 MG tablet Take 150 mg by mouth at bedtime.   Yes [provider]        Evans Lance, MD    Current  Facility-Administered Medications  Medication Dose Route Frequency Provider Last Rate Last Admin   albuterol (PROVENTIL) (2.5 MG/3ML) 0.083% nebulizer solution 2.5 mg  2.5 mg Nebulization Q2H PRN Elgergawy, Silver Huguenin, MD   2.5 mg at 10/26/22 [id number]   amLODipine (NORVASC) tablet 5 mg  5 mg Oral Daily Elgergawy, Silver Huguenin, MD       fesoterodine (TOVIAZ) tablet 4 mg  4 mg Oral Daily Elgergawy, Silver Huguenin, MD       hydrALAZINE (APRESOLINE) injection 5 mg  5 mg Intravenous Q6H PRN Elgergawy, Silver Huguenin, MD       HYDROcodone-acetaminophen (NORCO/VICODIN) 5-325 MG per tablet 1 tablet  1 tablet Oral Q6H PRN Elgergawy, Silver Huguenin, MD   1 tablet at 10/25/22 2210   insulin aspart (novoLOG) injection 0-5 Units  0-5 Units Subcutaneous QHS Elgergawy, Silver Huguenin, MD       insulin aspart (novoLOG) injection 0-9 Units  0-9 Units Subcutaneous TID WC Elgergawy, Silver Huguenin, MD       [START ON 10/29/2022] pantoprazole (PROTONIX) injection 40 mg  40 mg Intravenous Q12H Elgergawy, Silver Huguenin, MD       pantoprozole (PROTONIX) 80 mg /NS 100 mL infusion  8 mg/hr Intravenous Continuous Elgergawy, Silver Huguenin, MD 10 mL/hr at 10/26/22 0009 8 mg/hr at 10/26/22 0009   simvastatin (ZOCOR) tablet 20 mg  20 mg Oral QPM Elgergawy, Silver Huguenin, MD   20 mg at 10/25/22 2210   traZODone (DESYREL) tablet 150 mg  150 mg Oral QHS Elgergawy, Silver Huguenin, MD   150 mg at 10/25/22 2210    Allergies as of 10/25/2022 - Review Complete 10/25/2022  Allergen Reaction Noted   Penicillins Shortness Of Breath, Itching, Swelling, and Rash 07/10/2011   Lisinopril Cough 11/26/2017   Macrobid [nitrofurantoin] Hives, Diarrhea, Itching, and Nausea And Vomiting 06/19/2016   Codeine Itching, Swelling, and Rash 07/10/2011   Levaquin [levofloxacin in d5w] Itching, Swelling, and Other (See Comments) 03/13/2014   Sulfa drugs cross reactors Itching, Swelling, and Rash 07/10/2011    Past Medical History:  Diagnosis Date   Anemia    takes Folic Acid daily   Arthritis    takes  Plaquenil daily and Methotrexate 2 times a week   Cough    takes Allegra daily   Depression    takes Prozac at bedtime   Diabetes mellitus without complication (HCC)    takes Metformin daily   Family history of anesthesia complication    sister post op N/V   History of blood transfusion    History of UTI    Hyperlipidemia    takes Simvastatin nightly   Joint pain    Joint swelling    Pacemaker    Seasonal allergies    takes Allegra daily   Sinoatrial node dysfunction ([place])    has ICD    Past Surgical History:  Procedure Laterality Date   ABDOMINAL HYSTERECTOMY     CHOLECYSTECTOMY     COLONOSCOPY     EP IMPLANTABLE DEVICE N/A 08/06/2015   Procedure: PPM Generator Changeout;  Surgeon: Evans Lance, MD;  Location:  [facility];  Service: Cardiovascular;  Laterality: N/A;   INSERT / REPLACE / REMOVE PACEMAKER     right knee replacement     TOTAL KNEE ARTHROPLASTY Left 05/06/2013   Dr Durward Fortes   TOTAL KNEE ARTHROPLASTY Left 05/06/2013   Procedure: TOTAL KNEE ARTHROPLASTY;  Surgeon: Garald Balding, MD;  Location: [facility];  Service: Orthopedics;  Laterality: Left;    Family History  Problem Relation Age of Onset   COPD Mother    Stroke Father    Heart attack Father    Heart failure Sister    Diabetes Sister    CAD Brother    Diabetes Brother    Multiple sclerosis Sister    CAD Brother    Diabetes Brother    Renal cancer Brother     Social History   Socioeconomic History   Marital status: Married    Spouse name: Not on file   Number of children: Not on file   Years of education: Not on file   Highest education level: Not on file  Occupational History   Not on file  Tobacco Use   Smoking status: Former    Packs/day: 1.00    Years: 52.00    Additional pack years: 0.00    Total pack years: 52.00    Types: Cigarettes    Start date: 03/01/1962    Quit date: 11/28/2020    Years since quitting: 1.9   Smokeless tobacco: Never  Vaping Use   Vaping Use:  Never used  Substance and Sexual Activity   Alcohol use: No    Alcohol/week: 0.0 standard drinks of alcohol   Drug use: No   Sexual activity: Not Currently    Birth control/protection: Surgical  Other Topics Concern   Not on file  Social History Narrative   Not on file   Social Determinants of Health   Financial Resource Strain: Not on file  Food Insecurity: No Food Insecurity (10/25/2022)   Hunger Vital Sign    Worried About Running Out of Food in the Last Year: Never true    Ran Out of Food in the Last Year: Never true  Transportation Needs: Unmet Transportation Needs (10/25/2022)   PRAPARE - Hydrologist (Medical): Yes    Lack of Transportation (Non-Medical): Yes  Physical Activity: Not on file  Stress: Not on file  Social Connections: Not on file  Intimate Partner Violence: Not At Risk (10/25/2022)   Humiliation, Afraid, Rape, and Kick questionnaire    Fear of Current or Ex-Partner: No    Emotionally Abused: No    Physically Abused: No    Sexually Abused: No     Review of System:   General: Negative for anorexia, weight loss, fever, chills, fatigue, +weakness. Eyes: Negative for vision changes.  ENT: Negative for hoarseness, difficulty swallowing , nasal congestion. CV: Negative for chest pain, angina, palpitations, dyspnea on exertion, +peripheral edema.  Respiratory: +dyspnea at rest, +dyspnea on exertion. No cough, sputum, wheezing.  GI: See history of present illness. GU:  Negative for dysuria, hematuria, urinary incontinence, urinary frequency, nocturnal urination.  MS: + for joint pain, low back pain.  Derm: Negative for rash or itching.  Neuro: Negative for weakness, abnormal sensation, seizure, frequent headaches, memory loss, confusion.  Psych: Negative for anxiety, depression, suicidal ideation, hallucinations.  Endo: Negative for unusual weight change.  Heme: Negative for bruising or bleeding. Allergy: Negative for rash or  hives.      Physical  Examination:   Vital signs in last 24 hours: Temp:  [97.5 F (36.4 C)-98.9 F (37.2 C)] 98.9 F (37.2 C) (03/28 0741) Pulse Rate:  [79-99] 79 (03/28 0741) Resp:  [15-22] 21 (03/28 0741) BP: (152-178)/(54-85) 171/54 (03/28 0741) SpO2:  [93 %-98 %] 96 % (03/28 0741) Weight:  [74.4 kg] 74.4 kg (03/27 1535) Last BM Date : 10/24/22  General: frail elderly female in NAD. Daughter and Granddaughter at bedside.  Head: Normocephalic, atraumatic.   Eyes: Conjunctiva pale, no icterus. Mouth: Oropharyngeal mucosa moist and pink , no lesions erythema or exudate. Neck: Supple without thyromegaly, masses, or lymphadenopathy.  Lungs: diminished breath sounds on the right base otherwise clear to auscultation.  Heart: Regular rate and rhythm, no murmurs rubs or gallops.  Abdomen: Bowel sounds are normal,  nondistended, no hepatosplenomegaly or masses, no abdominal bruits or hernia , no rebound or guarding. Lower abdominal pain noted below umbilicus Rectal: not performed Extremities: 2-3+ pitting edema in bilateral lower legs, 2+ to bilateral thighs. No clubbing, deformity.  Neuro: Alert and oriented x 4 , grossly normal neurologically.  Skin: Warm and dry, no rash or jaundice.   Psych: Alert and cooperative, normal mood. Flat affect.        Intake/Output from previous day: 03/27 0701 - 03/28 0700 In: -  Out: 1200 [Urine:1200] Intake/Output this shift: No intake/output data recorded.  Lab Results:   CBC Recent Labs    10/25/22 1609 10/25/22 2201 10/26/22 0442  WBC 5.7  --  5.5  HGB 9.1* 8.4* 8.2*  HCT 28.4* 26.0* 25.7*  MCV 86.3  --  86.2  PLT 118*  --  122*   BMET Recent Labs    10/25/22 1606 10/25/22 1609 10/26/22 0442  NA 144 140 141  K 4.3 4.2 3.8  CL 110 108 110  CO2  --  21* 23  GLUCOSE 132* 138* 106*  BUN 32* 35* 32*  CREATININE 1.60* 1.60* 1.54*  CALCIUM  --  9.0 8.8*   LFT Recent Labs    10/25/22 1609 10/26/22 0442  BILITOT 1.1 1.5*   ALKPHOS 80 64  AST 36 35  ALT 19 16  PROT 7.6 6.6  ALBUMIN 3.5 3.0*    Lipase No results for input(s): "LIPASE" in the last 72 hours.  PT/INR Recent Labs    10/25/22 1646  LABPROT 21.1*  INR 1.8*     Hepatitis Panel No results for input(s): "HEPBSAG", "HCVAB", "HEPAIGM", "HEPBIGM" in the last 72 hours.   Imaging Studies:   CT ABDOMEN PELVIS W CONTRAST  Result Date: 10/25/2022 CLINICAL DATA:  Abdominal pain. EXAM: CT ABDOMEN AND PELVIS WITH CONTRAST TECHNIQUE: Multidetector CT imaging of the abdomen and pelvis was performed using the standard protocol following bolus administration of intravenous contrast. RADIATION DOSE REDUCTION: This exam was performed according to the departmental dose-optimization program which includes automated exposure control, adjustment of the mA and/or kV according to patient size and/or use of iterative reconstruction technique. CONTRAST:  59mL OMNIPAQUE IOHEXOL 300 MG/ML  SOLN COMPARISON:  CT abdomen pelvis dated 02/26/2018. FINDINGS: Motion limits the sensitivity of the exam. Lower chest: There is a large right pleural effusion with associated atelectasis. The heart is enlarged. Hepatobiliary: No focal liver abnormality is seen. Status post cholecystectomy. No biliary dilatation. Pancreas: Unremarkable. No pancreatic ductal dilatation or surrounding inflammatory changes. Spleen: The spleen is enlarged, similar to prior exam. Adrenals/Urinary Tract: Adrenal glands are unremarkable. Cysts in the left kidney measure up to 6 cm in size, unchanged from  prior exam. No imaging follow-up is recommended for this finding. No focal lesions in the right kidney. No renal calculi on either side. No hydronephrosis. Bladder is unremarkable. Stomach/Bowel: Stomach is within normal limits. The appendix is not definitely identified, however no pericecal inflammatory changes are noted to suggest acute appendicitis. There is colonic diverticulosis without evidence of  diverticulitis. No evidence of bowel wall thickening, distention, or inflammatory changes. Vascular/Lymphatic: The bilateral ovarian veins are dilated and tortuous. No lymphadenopathy in the abdomen or pelvis. Reproductive: Status post hysterectomy. No adnexal masses. Other: No abdominal wall hernia or abnormality. No abdominopelvic ascites. Musculoskeletal: There is a chronic appearing compression fracture of L1 with 3 mm retropulsion into the central canal and approximately 50-75% height loss centrally. Degenerative changes are seen in the spine. IMPRESSION: 1. No acute process in the abdomen or pelvis. 2. Large right pleural effusion with associated atelectasis. 3. Dilated and tortuous bilateral ovarian veins can be seen in the setting of pelvic congestion syndrome. 4. Chronic appearing compression fracture of L1 with 3 mm retropulsion into the central canal. Electronically Signed   By: Zerita Boers M.D.   On: 10/25/2022 17:58   CT Cervical Spine Wo Contrast  Result Date: 10/25/2022 CLINICAL DATA:  Neck trauma (Age >= 65y) EXAM: CT CERVICAL SPINE WITHOUT CONTRAST TECHNIQUE: Multidetector CT imaging of the cervical spine was performed without intravenous contrast. Multiplanar CT image reconstructions were also generated. RADIATION DOSE REDUCTION: This exam was performed according to the departmental dose-optimization program which includes automated exposure control, adjustment of the mA and/or kV according to patient size and/or use of iterative reconstruction technique. COMPARISON:  None Available. FINDINGS: Motion degraded exam. Alignment: Degenerative grade 1 anterolisthesis at C4-C5. Skull base and vertebrae: Extensive motion degradation which limits evaluation. Within this limitation, there is no evidence of cervical spine fracture. Soft tissues and spinal canal: No prevertebral fluid or swelling. No visible canal hematoma. Disc levels: Moderate degenerative disc disease at C5-C6. Mild-to-moderate  multilevel facet arthropathy. Upper chest: Layering right pleural effusion to the apex. Other: None. IMPRESSION: Motion degraded exam which limits evaluation. Within this limitation, there is no definite cervical spine fracture. If there is significant clinical concern, repeat exam should be considered. Electronically Signed   By: Maurine Simmering M.D.   On: 10/25/2022 17:54   CT Head Wo Contrast  Result Date: 10/25/2022 CLINICAL DATA:  Trauma EXAM: CT HEAD WITHOUT CONTRAST TECHNIQUE: Contiguous axial images were obtained from the base of the skull through the vertex without intravenous contrast. RADIATION DOSE REDUCTION: This exam was performed according to the departmental dose-optimization program which includes automated exposure control, adjustment of the mA and/or kV according to patient size and/or use of iterative reconstruction technique. COMPARISON:  Head CT 08/25/2022 FINDINGS: Brain: No evidence of acute infarction, hemorrhage, hydrocephalus, extra-axial collection or mass lesion/mass effect. Vascular: Atherosclerotic calcifications are present within the cavernous internal carotid arteries. Skull: Normal. Negative for fracture or focal lesion. Sinuses/Orbits: No acute finding. Other: None. IMPRESSION: No acute intracranial abnormality. Electronically Signed   By: Ronney Asters M.D.   On: 10/25/2022 17:49   DG Chest Port 1 View  Result Date: 10/25/2022 CLINICAL DATA:  Fall, pain, shortness of breath EXAM: PORTABLE CHEST 1 VIEW COMPARISON:  03/13/2014 FINDINGS: Redemonstrated left chest cardiac device with leads overlying the right atrium and ventricle. Enlarged cardiac silhouette, although the right heart border is incompletely imaged. Moderate right pleural effusion with associated atelectasis. No left pleural effusion. Increased interstitial prominence, possibly interstitial pulmonary edema. No acute  osseous abnormalities. Degenerative changes in the bilateral shoulders. IMPRESSION: 1. Moderate  right pleural effusion with associated atelectasis. 2. Increased interstitial prominence, possibly interstitial pulmonary edema. 3. Concern for mild cardiomegaly, although the heart borders are incompletely seen. Electronically Signed   By: Merilyn Baba M.D.   On: 10/25/2022 16:15   DG Pelvis Portable  Result Date: 10/25/2022 CLINICAL DATA:  Fall with pain EXAM: PORTABLE PELVIS 1 VIEWS COMPARISON:  None Available. FINDINGS: There is no evidence of pelvic fracture or diastasis. No pelvic bone lesions are seen. Degenerative changes of the bilateral hips. Rounded soft tissue density projecting over the pelvis, likely distended urinary bladder. IMPRESSION: No acute osseous abnormality identified. Electronically Signed   By: Darrin Nipper M.D.   On: 10/25/2022 16:15   CUP PACEART INCLINIC DEVICE CHECK  Result Date: 09/26/2022 Pacemaker check in clinic. Normal device function. Thresholds, sensing, impedances consistent with previous measurements. Device programmed to maximize longevity. AT/AF burden 0%, 1 HVR appears 8secs of NSVT occurring in June of 2023, none recent. Device  programmed at appropriate safety margins. PROGRAMMED Adaptive off on pacing threshold and set fixed at: RA - 1.5@.4 RV - 2.0@.4 Histogram distribution appropriate for patient activity level. Device programmed to optimize intrinsic conduction. Estimated longevity 8.5 YEARS. Patient enrolled in remote follow-up. Patient education completed.Leticia Penna, RN [phone] week]  Assessment:   Pleasant 77 year old female with history of diabetes, symptomatic bradycardia status post pacemaker, aortic stenosis, history of A-fib on Eliquis, rheumatoid arthritis presenting to the emergency department via EMS after patient being found in the floor for undisclosed amount of time, soiled with dark red BM that was heme positive.  Patient's family found her when they came to pick her up for routine outpatient echocardiogram. Patient reported a fall, denies loss  of consciousness, reports being on the floor for a couple hours.  GI consulted for melena, heme positive stool.  Melena: Noted by ED staff, heme positive.  Reported dark blood in BM upon arrival by EMS at the home.  Recent baseline labs unavailable.  In the ED her initial hemoglobin was 9.5, down to 8.2 today.  CT imaging showed splenomegaly but no evidence of liver abnormality.  She has mild thrombocytopenia.  Last dose of Eliquis Tuesday evening.  She was started on pantoprazole drip.  No BM since admission.  Would recommend upper endoscopy this admission but currently she has some resting shortness of breath in the setting of a large right pleural effusion requiring thoracentesis today.  Also needs additional washout of Eliquis in the setting of renal insufficiency.  Plan:   Continue pantoprazole drip. Continue clear liquid diet, n.p.o. after midnight. Serial H&H, transfuse as needed. Tentatively plan for upper endoscopy tomorrow if stable from cardiopulmonary standpoint.   Follow-up thoracentesis, echocardiogram.   LOS: 1 day   We would like to thank you for the opportunity to participate in the care of Tracey Koch.  Laureen Ochs. Bernarda Caffey [facility] [phone] 3/28/20248:12 AM

## 2022-10-26 NOTE — Assessment & Plan Note (Addendum)
Suspect CKD stage 3b Hypokalemia, hypomagnesemia,   Patient with improved volume status. Today renal function with serum cr at 1,63 with K at 3,6 and serum bicarbonate at 24.  Na 136 and Mg 1,6  Follow up CK is 244.   Replete Mg with Mag sulfate and add 40 meq Kcl.

## 2022-10-26 NOTE — Assessment & Plan Note (Signed)
Continue blood pressure control with amlodipine.  

## 2022-10-26 NOTE — Anesthesia Preprocedure Evaluation (Addendum)
Anesthesia Evaluation  Patient identified by MRN, date of birth, ID band Patient awake    Reviewed: Allergy & Precautions, H&P , NPO status , Patient's Chart, lab work & pertinent test results  History of Anesthesia Complications (+) Family history of anesthesia reaction and history of anesthetic complications  Airway Mallampati: II  TM Distance: >3 FB Neck ROM: Full    Dental  (+) Missing, Poor Dentition, Dental Advisory Given   Pulmonary shortness of breath and with exertion, former smoker Right pleural effusion, and thoracentesis and drained more than a liter of fluid.   Narrative & Impression CLINICAL DATA:  RIGHT pleural effusion post thoracentesis   EXAM: CHEST  1 VIEW   COMPARISON:  10/25/2022   FINDINGS: LEFT pacer   Normal heart size, mediastinal contours, and pulmonary vascularity.   Decreased RIGHT pleural effusion and basilar atelectasis post thoracentesis.   No pneumothorax.   LEFT lung remains clear.   IMPRESSION: No pneumothorax following thoracentesis.     Electronically Signed   By: Lavonia Dana M.D.   On: 10/26/2022 12:01          rales    Cardiovascular Exercise Tolerance: Good hypertension, Pt. on medications +CHF  + pacemaker + Valvular Problems/Murmurs AS  Rhythm:Regular Rate:Normal + Systolic murmurs 1. Left ventricular ejection fraction, by estimation, is 60 to 65%. The  left ventricle has normal function. The left ventricle has no regional  wall motion abnormalities. Left ventricular diastolic parameters are  indeterminate. Elevated left atrial  pressure.   2. Right ventricular systolic function is normal. The right ventricular  size is normal. Tricuspid regurgitation signal is inadequate for assessing  PA pressure.   3. Left atrial size was moderately dilated.   4. The mitral valve is abnormal. Mild mitral valve regurgitation. No  evidence of mitral stenosis.   5. The aortic  valve was not well visualized. There is mild calcification  of the aortic valve. There is mild thickening of the aortic valve. Aortic  valve regurgitation is not visualized. Mild to moderate aortic valve  stenosis. Aortic valve mean gradient  measures 12.5 mmHg. Aortic valve peak gradient measures 22.5 mmHg. Aortic  valve area, by VTI measures 1.44 cm.   6. The inferior vena cava is normal in size with greater than 50%  respiratory variability, suggesting right atrial pressure of 3 mmHg.     Neuro/Psych  PSYCHIATRIC DISORDERS  Depression    negative neurological ROS     GI/Hepatic negative GI ROS, Neg liver ROS,,,  Endo/Other  diabetes, Well Controlled, Type 2, Oral Hypoglycemic Agents    Renal/GU Renal disease  negative genitourinary   Musculoskeletal  (+) Arthritis , Rheumatoid disorders,    Abdominal   Peds negative pediatric ROS (+)  Hematology  (+) Blood dyscrasia, anemia   Anesthesia Other Findings   Reproductive/Obstetrics negative OB ROS                             Anesthesia Physical Anesthesia Plan  ASA: 4  Anesthesia Plan: General   Post-op Pain Management: Minimal or no pain anticipated   Induction: Intravenous  PONV Risk Score and Plan: Propofol infusion  Airway Management Planned: Nasal Cannula and Natural Airway  Additional Equipment:   Intra-op Plan:   Post-operative Plan:   Informed Consent: I have reviewed the patients History and Physical, chart, labs and discussed the procedure including the risks, benefits and alternatives for the proposed anesthesia with the patient  or authorized representative who has indicated his/her understanding and acceptance.    Discussed DNR with patient and Discussed DNR with power of attorney.   Dental advisory given  Plan Discussed with: CRNA and Surgeon  Anesthesia Plan Comments: (In case of cardiac arrest, Patient and family doesn't want prolonged resuscitation, agreed to get  brief resuscitation and agreed to get intubation in case of temporary respiratory arrest from anesthesia. )        Anesthesia Quick Evaluation

## 2022-10-26 NOTE — Assessment & Plan Note (Addendum)
Echocardiogram with preserved LV systolic function with EF 60 to 65%, No LVH, elevated LA pressure, with moderate LA dilatation, mild mitral regurgitation, mild to moderate aortic stenosis.   Positive right pleural effusion sp thoracentesis 1,4 L. Transudate fluid per protein ratio.   Patient was placed on IV furosemide, negative fluid balance was achieved, -3,036 ml, with improvement in her symptoms.  Transition to oral furosemide.

## 2022-10-26 NOTE — Procedures (Signed)
   US guided Right thoracentesis 1.2 liters dark yellow slightly hazy fluid removed Sent for labs per MD  Pt tolerated well  C Post CXR:  No PTX per Dr Thornton Papas  EBL: scant

## 2022-10-26 NOTE — Discharge Instructions (Signed)
Transportation Agency Name: [employer] [employer] [facility] [employer] [facility] [place]. Address: [address] [place], [place] [address] Phone: [phone] Website: www.adtsrc.org Services Offered: Meals on [employer]. Home care, at home assisted  living, [place] for [place],  [employer] and [employer] transportation system.  Agency Name: [facility] Address: [address], [place], [place] [address] Phone: [phone] Website: [email or web address] Services Offered: Transportation for a fee.

## 2022-10-26 NOTE — Assessment & Plan Note (Addendum)
Thrombocytopenia  Follow up hgb is 8,7 and plt 127  EGD with no signs active bleeding. GI bleed has been ruled out  Plan to check iron panel.

## 2022-10-26 NOTE — Hospital Course (Addendum)
Tracey Koch was admitted to the hospital with the working diagnosis of gastrointestinal bleeding.   77 yo female with the past medical history of hypertension, rheumatoid arthritis, CKD, T2DM and aortic valve stenosis who presented after a mechanical fall. Apparently patient had a mechanical fall, her family found her on the floor. She does not recall the exact details of her fall. Recently she had a episode of volume overload, that was treated with increase dose of furosemide with good toleration. On her initial physical examination her blood pressure was 178/72, HR 99, RR 15 and 02 saturation 96%, lungs with decrease breath sounds at the right side and positive bilateral wheezing, heart with S1 and S2 present with no gallops, positive systolic murmur at the base, abdomen with no distention and no lower extremity edema.   Na 140, K 4,2 CL 108, bicarbonate 21, glucose 138, bun 35 cr 1,60 BNP 680 High sensitive troponin 13 and 18  Wbc 5,7 hgb 9,1 plt 118  Hemoccult positive stool.   Urine analysis SG 1,012, protein 100, negative leukocytes, large hgb dipstick but 0-5 rbc   Head and cervical spine CT with no acute changes.  CT abdomen and pelvis with no acute process. Large right pleural effusion with atelectasis. Chronic appearing compression fracture of L1 with 3 mm retropulsion into the central canal.   Chest radiograph with cardiomegaly, right large pleural effusion, pacemaker in place with one atrial lead and one right ventricular lead.   EKG 87 bpm, normal axis, normal intervals, sinus rhythm with q wave in V1 and V2 with no significant ST segment or T wave changes.   Patient was placed on IV pantoprazole for suspected upper gastrointestinal bleed. 03/28 right thoracentesis 1.2 L removed and further diuresis with IV furosemide.  03/29 EGD with congestive gastropathy. Patient is euvolemic, plan to have close follow up with primary care as outpatient.  Home health services for home PT.

## 2022-10-26 NOTE — Progress Notes (Signed)
  Echocardiogram 2D Echocardiogram has been performed.  Tracey Koch 10/26/2022, 9:24 AM

## 2022-10-26 NOTE — Progress Notes (Signed)
Thoracentesis complete 1.2L pleural fluid removed. No signs of distress.

## 2022-10-26 NOTE — Assessment & Plan Note (Addendum)
Patient had a fall at home, not clear mechanism of the fall.   Likely multifactorial from fluid overload and symptomatic anemia.   Patient will continue Pt at home with home health services.

## 2022-10-26 NOTE — TOC Initial Note (Signed)
Transition of Care [facility]) - Initial/Assessment Note    Patient Details  Name: Tracey Koch MRN: [id number] Date of Birth: 11-Jan-1946  [facility] of [facility]) CM/SW Contact:    Boneta Lucks, RN Phone Number: 10/26/2022, 11:01 AM  Clinical Narrative:       Patient admitted with GI bleed. PT is recommending HHPT. CM spoke with her daughter, she is agreeable, no preferences. Marjory Lies with [facility] accepted the referral for HHPT. Added to AVS.           Expected Discharge Plan: [place] Barriers to Discharge: Continued Medical Work up  Patient Goals and CMS Choice Patient states their goals for this hospitalization and ongoing recovery are:: to go home. CMS Medicare.gov Compare Post Acute Care list provided to:: Patient Represenative (must comment) Choice offered to / list presented to : Adult Children    Expected Discharge Plan and Services      Living arrangements for the past 2 months: Single Family Home       HH Arranged: PT HH Agency: [facility] Date [place]: 10/26/22 Time HH Agency Contacted: 1100 Representative spoke with at [place]: Marjory Lies  Prior Living Arrangements/Services Living arrangements for the past 2 months: Mobile   Patient language and need for interpreter reviewed:: Yes Do you feel safe going back to the place where you live?: Yes      Need for Family Participation in Patient Care: Yes (Comment) Care giver support system in place?: Yes (comment)   Criminal Activity/Legal Involvement Pertinent to Current Situation/Hospitalization: No - Comment as needed  Activities of Daily Living Home Assistive Devices/Equipment: Eyeglasses, Dentures (specify type) ADL Screening (condition at time of admission) Patient's cognitive ability adequate to safely complete daily activities?: Yes Is the patient deaf or have difficulty hearing?: No Does the patient have difficulty seeing, even when wearing  glasses/contacts?: Yes Does the patient have difficulty concentrating, remembering, or making decisions?: No Patient able to express need for assistance with ADLs?: Yes Does the patient have difficulty dressing or bathing?: Yes Independently performs ADLs?: No Communication: Needs assistance Is this a change from baseline?: Change from baseline, expected to last >3 days Dressing (OT): Needs assistance Is this a change from baseline?: Change from baseline, expected to last >3 days Grooming: Needs assistance Is this a change from baseline?: Change from baseline, expected to last >3 days Feeding: Independent Bathing: Needs assistance Is this a change from baseline?: Change from baseline, expected to last >3 days Toileting: Needs assistance Is this a change from baseline?: Change from baseline, expected to last >3days In/Out Bed: Needs assistance Is this a change from baseline?: Change from baseline, expected to last >3 days Walks in Home: Needs assistance Is this a change from baseline?: Change from baseline, expected to last <3 days Does the patient have difficulty walking or climbing stairs?: Yes Weakness of Legs: Both Weakness of Arms/Hands: Both Emotional Assessment     Affect (typically observed): Accepting Orientation: : Oriented to Self Alcohol / Substance Use: Not Applicable Psych Involvement: No (comment)  Admission diagnosis:  GI bleed [K92.2] Lower GI bleed [K92.2] Patient Active Problem List   Diagnosis Date Noted   GI bleed 10/25/2022   Pleural effusion on right 10/25/2022   Aortic valve stenosis 10/25/2022   Paroxysmal atrial fibrillation ([place]) 02/22/2017   Normocytic anemia 06/20/2016   Hyponatremia 06/20/2016   Allergic reaction 06/20/2016   Allergic reaction caused by a drug 06/19/2016   Tobacco abuse 09/18/2014  Sepsis ([place]) 03/13/2014   UTI (urinary tract infection) 03/13/2014   Type 2 diabetes mellitus without complication ([place]) [id number]    Osteoarthritis of left knee 05/08/2013   Sinoatrial node dysfunction ([place])    Essential hypertension 02/02/2009   BRADYCARDIA 02/02/2009   Rheumatoid arthritis ([place]) 02/02/2009   PACEMAKER, PERMANENT 02/02/2009   PCP:  Manon Hilding, MD Pharmacy:   [facility] [place] [id number] [place] [id number] Phone: [phone] Fax: [phone]   Social Determinants of Health (SDOH) Social History: [place]: No Food Insecurity (10/25/2022)  Housing: Low Risk  (10/25/2022)  Transportation Needs: Unmet Transportation Needs (10/25/2022)  Utilities: Not At Risk (10/25/2022)  Tobacco Use: Medium Risk (10/25/2022)   SDOH Interventions: Transportation Interventions: Inpatient TOC, Other (Comment) [facility] transportation resources)   Readmission Risk Interventions    10/26/2022   10:55 AM  Readmission Risk Prevention Plan  Transportation Screening Complete  PCP or Specialist Appt within 5-7 Days Not Complete  Home Care Screening Complete  Medication Review (RN CM) Complete

## 2022-10-26 NOTE — Assessment & Plan Note (Addendum)
Patient was placed on insulin sliding scale for glucose cover and monitoring with good toleration.  Fasting glucose this am is 116 mg/dl.

## 2022-10-26 NOTE — Plan of Care (Signed)
  Problem: Acute Rehab PT Goals(only PT should resolve) Goal: Pt Will Go Supine/Side To Sit Outcome: Progressing Flowsheets (Taken 10/26/2022 1440) Pt will go Supine/Side to Sit:  with supervision  with min guard assist Goal: Patient Will Transfer Sit To/From Stand Outcome: Progressing Flowsheets (Taken 10/26/2022 1440) Patient will transfer sit to/from stand:  with supervision  with min guard assist Goal: Pt Will Transfer Bed To Chair/Chair To Bed Outcome: Progressing Flowsheets (Taken 10/26/2022 1440) Pt will Transfer Bed to Chair/Chair to Bed:  with supervision  min guard assist Goal: Pt Will Ambulate Outcome: Progressing Flowsheets (Taken 10/26/2022 1440) Pt will Ambulate:  75 feet  with supervision  with min guard assist  with rolling walker   2:40 PM, 10/26/22 Lonell Grandchild, MPT Physical Therapist with [facility] [phone] office [phone] mobile phone

## 2022-10-26 NOTE — Assessment & Plan Note (Signed)
Follow up with PT and OT.

## 2022-10-27 ENCOUNTER — Encounter (HOSPITAL_COMMUNITY): Payer: Self-pay | Admitting: Internal Medicine

## 2022-10-27 ENCOUNTER — Inpatient Hospital Stay (HOSPITAL_COMMUNITY): Payer: Medicare PPO | Admitting: Anesthesiology

## 2022-10-27 ENCOUNTER — Inpatient Hospital Stay (HOSPITAL_COMMUNITY): Payer: Medicare PPO

## 2022-10-27 ENCOUNTER — Encounter (HOSPITAL_COMMUNITY)
Admission: EM | Disposition: A | Discharge status: Home Health | Payer: Self-pay | Source: Home / Self Care | Attending: Internal Medicine

## 2022-10-27 ENCOUNTER — Telehealth (INDEPENDENT_AMBULATORY_CARE_PROVIDER_SITE_OTHER): Payer: Self-pay | Admitting: Gastroenterology

## 2022-10-27 DIAGNOSIS — K3189 Other diseases of stomach and duodenum: Secondary | ICD-10-CM

## 2022-10-27 DIAGNOSIS — K921 Melena: Secondary | ICD-10-CM

## 2022-10-27 DIAGNOSIS — I5033 Acute on chronic diastolic (congestive) heart failure: Secondary | ICD-10-CM | POA: Diagnosis not present

## 2022-10-27 DIAGNOSIS — I10 Essential (primary) hypertension: Secondary | ICD-10-CM | POA: Diagnosis not present

## 2022-10-27 DIAGNOSIS — W19XXXA Unspecified fall, initial encounter: Secondary | ICD-10-CM | POA: Diagnosis not present

## 2022-10-27 DIAGNOSIS — L8990 Pressure ulcer of unspecified site, unspecified stage: Secondary | ICD-10-CM | POA: Diagnosis present

## 2022-10-27 DIAGNOSIS — E119 Type 2 diabetes mellitus without complications: Secondary | ICD-10-CM

## 2022-10-27 DIAGNOSIS — I480 Paroxysmal atrial fibrillation: Secondary | ICD-10-CM | POA: Diagnosis not present

## 2022-10-27 HISTORY — PX: ESOPHAGOGASTRODUODENOSCOPY (EGD) WITH PROPOFOL: SHX5813

## 2022-10-27 HISTORY — PX: BIOPSY: SHX5522

## 2022-10-27 LAB — CBC WITH DIFFERENTIAL/PLATELET
Abs Immature Granulocytes: 0.02 10*3/uL (ref 0.00–0.07)
Basophils Absolute: 0 10*3/uL (ref 0.0–0.1)
Basophils Relative: 1 %
Eosinophils Absolute: 0.4 10*3/uL (ref 0.0–0.5)
Eosinophils Relative: 6 %
HCT: 27.7 % — ABNORMAL LOW (ref 36.0–46.0)
Hemoglobin: 8.7 g/dL — ABNORMAL LOW (ref 12.0–15.0)
Immature Granulocytes: 0 %
Lymphocytes Relative: 14 %
Lymphs Abs: 0.9 10*3/uL (ref 0.7–4.0)
MCH: 27.1 pg (ref 26.0–34.0)
MCHC: 31.4 g/dL (ref 30.0–36.0)
MCV: 86.3 fL (ref 80.0–100.0)
Monocytes Absolute: 0.8 10*3/uL (ref 0.1–1.0)
Monocytes Relative: 11 %
Neutro Abs: 4.6 10*3/uL (ref 1.7–7.7)
Neutrophils Relative %: 68 %
Platelets: 127 10*3/uL — ABNORMAL LOW (ref 150–400)
RBC: 3.21 MIL/uL — ABNORMAL LOW (ref 3.87–5.11)
RDW: 15.9 % — ABNORMAL HIGH (ref 11.5–15.5)
WBC: 6.7 10*3/uL (ref 4.0–10.5)
nRBC: 0 % (ref 0.0–0.2)

## 2022-10-27 LAB — IRON AND TIBC
Iron: 35 ug/dL (ref 28–170)
Saturation Ratios: 9 % — ABNORMAL LOW (ref 10.4–31.8)
TIBC: 410 ug/dL (ref 250–450)
UIBC: 375 ug/dL

## 2022-10-27 LAB — BASIC METABOLIC PANEL
Anion gap: 9 (ref 5–15)
BUN: 30 mg/dL — ABNORMAL HIGH (ref 8–23)
CO2: 24 mmol/L (ref 22–32)
Calcium: 8.5 mg/dL — ABNORMAL LOW (ref 8.9–10.3)
Chloride: 106 mmol/L (ref 98–111)
Creatinine, Ser: 1.63 mg/dL — ABNORMAL HIGH (ref 0.44–1.00)
GFR, Estimated: 32 mL/min — ABNORMAL LOW (ref >60)
Glucose, Bld: 116 mg/dL — ABNORMAL HIGH (ref 70–99)
Potassium: 3.6 mmol/L (ref 3.5–5.1)
Sodium: 139 mmol/L (ref 135–145)

## 2022-10-27 LAB — FERRITIN: Ferritin: 21 ng/mL (ref 11–307)

## 2022-10-27 LAB — CK: Total CK: 244 U/L — ABNORMAL HIGH (ref 38–234)

## 2022-10-27 LAB — HEMOGLOBIN A1C
Hgb A1c MFr Bld: 5.6 % (ref 4.8–5.6)
Mean Plasma Glucose: 114 mg/dL

## 2022-10-27 LAB — MAGNESIUM: Magnesium: 1.6 mg/dL — ABNORMAL LOW (ref 1.7–2.4)

## 2022-10-27 LAB — GLUCOSE, CAPILLARY
Glucose-Capillary: 127 mg/dL — ABNORMAL HIGH (ref 70–99)
Glucose-Capillary: 118 mg/dL — ABNORMAL HIGH (ref 70–99)

## 2022-10-27 SURGERY — ESOPHAGOGASTRODUODENOSCOPY (EGD) WITH PROPOFOL
Anesthesia: General

## 2022-10-27 MED ORDER — LACTATED RINGERS IV SOLN: INTRAVENOUS | Status: DC | PRN

## 2022-10-27 MED ORDER — PROPOFOL 500 MG/50ML IV EMUL
INTRAVENOUS | Status: DC | PRN
  Administered 2022-10-27: 150 ug/kg/min via INTRAVENOUS

## 2022-10-27 MED ORDER — PROPOFOL 10 MG/ML IV BOLUS
INTRAVENOUS | Status: DC | PRN
  Administered 2022-10-27: 10 mg via INTRAVENOUS
  Administered 2022-10-27: 50 mg via INTRAVENOUS

## 2022-10-27 MED ORDER — LIDOCAINE HCL (PF) 2 % IJ SOLN
INTRAMUSCULAR | Status: AC
  Filled 2022-10-27: qty 5

## 2022-10-27 MED ORDER — LOSARTAN POTASSIUM 50 MG PO TABS: 50.0000 mg | ORAL_TABLET | Freq: Every day | ORAL | 3 refills | Status: AC

## 2022-10-27 MED ORDER — POTASSIUM CHLORIDE CRYS ER 20 MEQ PO TBCR
40.0000 meq | EXTENDED_RELEASE_TABLET | Freq: Once | ORAL | Status: AC
  Administered 2022-10-27: 40 meq via ORAL
  Filled 2022-10-27 (×2): qty 2

## 2022-10-27 MED ORDER — MAGNESIUM SULFATE 4 GM/100ML IV SOLN
4.0000 g | Freq: Once | INTRAVENOUS | Status: AC
  Administered 2022-10-27: 4 g via INTRAVENOUS
  Filled 2022-10-27 (×2): qty 100

## 2022-10-27 MED ORDER — HYDROCODONE-ACETAMINOPHEN 7.5-325 MG PO TABS: 1.0000 | ORAL_TABLET | Freq: Two times a day (BID) | ORAL | 0 refills | Status: AC | PRN

## 2022-10-27 MED ORDER — BUTAMBEN-TETRACAINE-BENZOCAINE 2-2-14 % EX AERO
INHALATION_SPRAY | CUTANEOUS | Status: DC | PRN
  Administered 2022-10-27: 1 via TOPICAL

## 2022-10-27 MED ORDER — BUTAMBEN-TETRACAINE-BENZOCAINE 2-2-14 % EX AERO
INHALATION_SPRAY | CUTANEOUS | Status: AC
  Filled 2022-10-27: qty 5

## 2022-10-27 MED ORDER — LIDOCAINE HCL 1 % IJ SOLN
INTRAMUSCULAR | Status: DC | PRN
  Administered 2022-10-27: 60 mg via INTRADERMAL

## 2022-10-27 NOTE — Progress Notes (Signed)
Ng Discharge Note  Admit Date:  10/25/2022 Discharge date: 10/27/2022   Kynzi Butcher to be D/C'd Home per MD order.  AVS completed. Patient/caregiver able to verbalize understanding.  Discharge Medication: Allergies as of 10/27/2022       Reactions   Penicillins Shortness Of Breath, Itching, Swelling, Rash   Has patient had a PCN reaction causing immediate rash, facial/tongue/throat swelling, SOB or lightheadedness with hypotension: Yes Has patient had a PCN reaction causing severe rash involving mucus membranes or skin necrosis: No Has patient had a PCN reaction that required hospitalization No Has patient had a PCN reaction occurring within the last 10 years: No If all of the above answers are "NO", then may proceed with Cephalosporin use.   Lisinopril Cough   Macrobid [nitrofurantoin] Hives, Diarrhea, Itching, Nausea And Vomiting   Codeine Itching, Swelling, Rash   Levaquin [levofloxacin In D5w] Itching, Swelling, Other (See Comments)   Can take low dose   Sulfa Drugs Cross Reactors Itching, Swelling, Rash        Medication List     TAKE these medications    albuterol 108 (90 Base) MCG/ACT inhaler Commonly known as: VENTOLIN HFA Inhale into the lungs every 6 (six) hours as needed for wheezing or shortness of breath.   alendronate 70 MG tablet Commonly known as: FOSAMAX Take 70 mg by mouth once a week.   apixaban 5 MG Tabs tablet Commonly known as: Eliquis Take 1 tablet (5 mg total) by mouth 2 (two) times daily.   fexofenadine 180 MG tablet Commonly known as: ALLEGRA Take 180 mg by mouth daily.   furosemide 20 MG tablet Commonly known as: LASIX Take 20 mg by mouth daily. What changed: Another medication with the same name was removed. Continue taking this medication, and follow the directions you see here.   HYDROcodone-acetaminophen 7.5-325 MG tablet Commonly known as: NORCO Take 1 tablet by mouth 2 (two) times daily as needed for severe pain. What changed:   when to take this reasons to take this   losartan 50 MG tablet Commonly known as: COZAAR Take 1 tablet (50 mg total) by mouth daily. Start taking on: October 28, 2022   metFORMIN 500 MG tablet Commonly known as: GLUCOPHAGE Take 1,000 mg by mouth 2 (two) times daily with a meal.   multivitamin tablet Take 1 tablet by mouth daily.   simvastatin 20 MG tablet Commonly known as: ZOCOR Take 20 mg by mouth every evening.   solifenacin 5 MG tablet Commonly known as: VESICARE Take 5 mg by mouth daily.   traZODone 150 MG tablet Commonly known as: DESYREL Take 150 mg by mouth at bedtime.        Discharge Assessment: Vitals:   10/27/22 1330 10/27/22 1345  BP: 137/61 (!) 153/63  Pulse: 75 78  Resp: 15 17  Temp:    SpO2: 100% 99%   Skin clean, dry and intact without evidence of skin break down, no evidence of skin tears noted. IV catheter discontinued intact. Site without signs and symptoms of complications - no redness or edema noted at insertion site, patient denies c/o pain - only slight tenderness at site.  Dressing with slight pressure applied.  D/c Instructions-Education: Discharge instructions given to patient/family with verbalized understanding. D/c education completed with patient/family including follow up instructions, medication list, d/c activities limitations if indicated, with other d/c instructions as indicated by MD - patient able to verbalize understanding, all questions fully answered. Patient instructed to return to ED, call 911, or  call MD for any changes in condition.  Patient escorted via [place], and D/C home via private auto.  Tsosie Billing, LPN [id number] PM

## 2022-10-27 NOTE — Progress Notes (Signed)
We will proceed with EGD as scheduled.  I thoroughly discussed with the patient the procedure, including the risks involved. Patient understands what the procedure involves including the benefits and any risks. Patient understands alternatives to the proposed procedure. Risks including (but not limited to) bleeding, tearing of the lining (perforation), rupture of adjacent organs, problems with heart and lung function, infection, and medication reactions. A small percentage of complications may require surgery, hospitalization, repeat endoscopic procedure, and/or transfusion.  Patient understood and agreed.  Tracey Kinsel Castaneda, MD Gastroenterology and Hepatology [facility]  

## 2022-10-27 NOTE — Assessment & Plan Note (Signed)
  Nutrition Documentation    [place] ED to Hosp-Admission (Current) from 10/25/2022 in [place]  Nutrition Problem Inadequate oral intake  Etiology acute illness  Nutrition Goal Patient will meet greater than or equal to 90% of their needs  Interventions Education     ,  Active Pressure Injury/Wound(s)     Pressure Ulcer  Duration          Pressure Injury 10/26/22 Buttocks Stage 2 -  Partial thickness loss of dermis presenting as a shallow open injury with a red, pink wound bed without slough. <1 day

## 2022-10-27 NOTE — Care Management Important Message (Signed)
Important Message  Patient Details  Name: Tracey Koch MRN: [id number] Date of Birth: 10/26/1945   Medicare Important Message Given:  Yes (spoke with daughter Lindajo Royal in room  at [phone])     Tommy Medal 10/27/2022, 12:54 PM

## 2022-10-27 NOTE — Brief Op Note (Signed)
10/27/2022  1:13 PM  PATIENT:  Tracey Koch  77 y.o. female  PRE-OPERATIVE DIAGNOSIS:  melena, hemoccult positive stool, symptomatic anemia  POST-OPERATIVE DIAGNOSIS:  antrum congestion  PROCEDURE:  Procedure(s): ESOPHAGOGASTRODUODENOSCOPY (EGD) WITH PROPOFOL (N/A) BIOPSY  SURGEON:  Surgeon(s) and Role:    * Harvel Quale, MD - Primary  Patient underwent EGD under propofol sedation.  Tolerated the procedure adequately.    FINDINGS: - Normal esophagus.  - Congestive gastropathy.  Biopsied.  - Normal examined duodenum.   RECOMMENDATIONS - Return patient to hospital ward for ongoing care.  - Resume previous diet. - Check iron stores, if low start oral iron supplementation.  - Schedule outpatient colonoscopy. - Patient will follow up in GI clinic  in 3-4 weeks. - GI service will sign-off, please call us back if you have any more questions.  Maylon Peppers, MD Gastroenterology and Hepatology [facility]

## 2022-10-27 NOTE — Progress Notes (Signed)
Dr. Clearence Ped notified of patients BP of 171/68, no new orders at this time

## 2022-10-27 NOTE — Anesthesia Postprocedure Evaluation (Signed)
Anesthesia Post Note  Patient: Tracey Koch  Procedure(s) Performed: ESOPHAGOGASTRODUODENOSCOPY (EGD) WITH PROPOFOL BIOPSY  Patient location during evaluation: PACU Anesthesia Type: General Level of consciousness: awake and alert and oriented Pain management: pain level controlled Vital Signs Assessment: post-procedure vital signs reviewed and stable Respiratory status: spontaneous breathing, nonlabored ventilation and respiratory function stable Cardiovascular status: blood pressure returned to baseline and stable Postop Assessment: no apparent nausea or vomiting Anesthetic complications: no  No notable events documented.   Last Vitals:  Vitals:   10/27/22 1330 10/27/22 1345  BP: 137/61 (!) 153/63  Pulse: 75 78  Resp: 15 17  Temp:    SpO2: 100% 99%    Last Pain:  Vitals:   10/27/22 1320  TempSrc:   PainSc: Asleep                 Dorri Ozturk C Trace Wirick

## 2022-10-27 NOTE — Discharge Summary (Addendum)
Physician Discharge Summary   Patient: Tracey Koch MRN: [id number] DOB: 07-17-1946  Admit date:     10/25/2022  Discharge date: 10/27/22  Discharge Physician: Jimmy Picket Cecile Guevara   PCP: Manon Hilding, MD   Recommendations at discharge:    Follow up iron levels, may need IV iron infusion. Follow up with Dr Quintin Alto next week to follow up on renal function and volume status. Patient will continue her regular dose of furosemide 20 mg po daily.  Follow up with Dr Jenetta Downer from GI as outpatient for colonoscopy.   Discharge Diagnoses: Principal Problem:   Fall at home, initial encounter Active Problems:   Acute on chronic diastolic CHF (congestive heart failure) (HCC)   Essential hypertension   Paroxysmal atrial fibrillation (HCC)   Acute renal failure superimposed on chronic kidney disease ([place])   GI bleed   Type 2 diabetes mellitus without complication (HCC)   Rheumatoid arthritis ([place])   Melena  Resolved Problems:   * No resolved hospital problems. [facility] Course: [name] was admitted to the hospital with the working diagnosis of gastrointestinal bleeding.   77 yo female with the past medical history of hypertension, rheumatoid arthritis, CKD, T2DM and aortic valve stenosis who presented after a mechanical fall. Apparently patient had a mechanical fall, her family found her on the floor. She does not recall the exact details of her fall. Recently she had a episode of volume overload, that was treated with increase dose of furosemide with good toleration. On her initial physical examination her blood pressure was 178/72, HR 99, RR 15 and 02 saturation 96%, lungs with decrease breath sounds at the right side and positive bilateral wheezing, heart with S1 and S2 present with no gallops, positive systolic murmur at the base, abdomen with no distention and no lower extremity edema.   Na 140, K 4,2 CL 108, bicarbonate 21, glucose 138, bun 35 cr 1,60 BNP 680 High sensitive  troponin 13 and 18  Wbc 5,7 hgb 9,1 plt 118  Hemoccult positive stool.   Urine analysis SG 1,012, protein 100, negative leukocytes, large hgb dipstick but 0-5 rbc   Head and cervical spine CT with no acute changes.  CT abdomen and pelvis with no acute process. Large right pleural effusion with atelectasis. Chronic appearing compression fracture of L1 with 3 mm retropulsion into the central canal.   Chest radiograph with cardiomegaly, right large pleural effusion, pacemaker in place with one atrial lead and one right ventricular lead.   EKG 87 bpm, normal axis, normal intervals, sinus rhythm with q wave in V1 and V2 with no significant ST segment or T wave changes.   Patient was placed on IV pantoprazole for suspected upper gastrointestinal bleed. 03/28 right thoracentesis 1.2 L removed and further diuresis with IV furosemide.  03/29 EGD with congestive gastropathy. Patient is euvolemic, plan to have close follow up with primary care as outpatient.  Home health services for home PT.  Assessment and Plan: * Fall at home, initial encounter Patient had a fall at home, not clear mechanism of the fall.   Likely multifactorial from fluid overload and symptomatic anemia.   Patient will continue Pt at home with home health services.   Acute on chronic diastolic CHF (congestive heart failure) (HCC) Echocardiogram with preserved LV systolic function with EF 60 to 65%, No LVH, elevated LA pressure, with moderate LA dilatation, mild mitral regurgitation, mild to moderate aortic stenosis.   Positive right pleural effusion sp thoracentesis 1,4 L. Transudate  fluid per protein ratio.   Patient was placed on IV furosemide, negative fluid balance was achieved, -3,036 ml, with improvement in her symptoms.  Transition to oral furosemide.   Essential hypertension Continue blood pressure control with amlodipine.   Paroxysmal atrial fibrillation (HCC) Patient rate and rhythm controlled  Ok to resume  anticoagulation per GI recommendations.   Acute renal failure superimposed on chronic kidney disease ([place]) Suspect CKD stage 3b Hypokalemia, hypomagnesemia,   Patient with improved volume status. Today renal function with serum cr at 1,63 with K at 3,6 and serum bicarbonate at 24.  Na 136 and Mg 1,6  Follow up CK is 244.   Replete Mg with Mag sulfate and add 40 meq Kcl.   Chronic anemia Thrombocytopenia  Follow up hgb is 8,7 and plt 127  EGD with no signs active bleeding. GI bleed has been ruled out  Plan to check iron panel.   Type 2 diabetes mellitus without complication (HCC) Patient was placed on insulin sliding scale for glucose cover and monitoring with good toleration.  Fasting glucose this am is 116 mg/dl.    Rheumatoid arthritis ([place]) Follow up with PT and OT.   Pressure injury of skin   Nutrition Documentation    [facility] ED to Hosp-Admission (Current) from 10/25/2022 in [facility]  Nutrition Problem Inadequate oral intake  Etiology acute illness  Nutrition Goal Patient will meet greater than or equal to 90% of their needs  Interventions Education     ,  Active Pressure Injury/Wound(s)     Pressure Ulcer  Duration          Pressure Injury 10/26/22 Buttocks Stage 2 -  Partial thickness loss of dermis presenting as a shallow open injury with a red, pink wound bed without slough. <1 day            Present on admission       Consultants: GI  Procedures performed: upper endoscopy   Disposition: Home Diet recommendation:  Cardiac diet DISCHARGE MEDICATION: Allergies as of 10/27/2022       Reactions   Penicillins Shortness Of Breath, Itching, Swelling, Rash   Has patient had a PCN reaction causing immediate rash, facial/tongue/throat swelling, SOB or lightheadedness with hypotension: Yes Has patient had a PCN reaction causing severe rash involving mucus membranes or skin necrosis: No Has patient had a PCN reaction  that required hospitalization No Has patient had a PCN reaction occurring within the last 10 years: No If all of the above answers are "NO", then may proceed with Cephalosporin use.   Lisinopril Cough   Macrobid [nitrofurantoin] Hives, Diarrhea, Itching, Nausea And Vomiting   Codeine Itching, Swelling, Rash   Levaquin [levofloxacin In D5w] Itching, Swelling, Other (See Comments)   Can take low dose   Sulfa Drugs Cross Reactors Itching, Swelling, Rash        Medication List     TAKE these medications    albuterol 108 (90 Base) MCG/ACT inhaler Commonly known as: VENTOLIN HFA Inhale into the lungs every 6 (six) hours as needed for wheezing or shortness of breath.   alendronate 70 MG tablet Commonly known as: FOSAMAX Take 70 mg by mouth once a week.   apixaban 5 MG Tabs tablet Commonly known as: Eliquis Take 1 tablet (5 mg total) by mouth 2 (two) times daily.   fexofenadine 180 MG tablet Commonly known as: ALLEGRA Take 180 mg by mouth daily.   furosemide 20 MG tablet Commonly known as:  LASIX Take 20 mg by mouth daily. What changed: Another medication with the same name was removed. Continue taking this medication, and follow the directions you see here.   HYDROcodone-acetaminophen 7.5-325 MG tablet Commonly known as: NORCO Take 1 tablet by mouth 2 (two) times daily as needed for severe pain. What changed:  when to take this reasons to take this   losartan 50 MG tablet Commonly known as: COZAAR Take 1 tablet (50 mg total) by mouth daily. Start taking on: October 28, 2022   metFORMIN 500 MG tablet Commonly known as: GLUCOPHAGE Take 1,000 mg by mouth 2 (two) times daily with a meal.   multivitamin tablet Take 1 tablet by mouth daily.   simvastatin 20 MG tablet Commonly known as: ZOCOR Take 20 mg by mouth every evening.   solifenacin 5 MG tablet Commonly known as: VESICARE Take 5 mg by mouth daily.   traZODone 150 MG tablet Commonly known as: DESYREL Take 150  mg by mouth at bedtime.        Follow-up Information     [facility] Follow up.   Specialty: [place] Why: PT will call to schedule your first home vist. Contact information: [address] [place] [address] [phone]                Discharge Exam: Danley Danker Weights   10/25/22 1535 10/27/22 1114  Weight: 74.4 kg 74.4 kg   BP (!) 153/63   Pulse 78   Temp 98 F (36.7 C)   Resp 17   Ht 5\' 6"  (1.676 m)   Wt 74.4 kg   SpO2 99%   BMI 26.47 kg/m   Patient is feeling better, no dyspnea or chest pain, no melena or hematochezia.   Neurology awake and alert ENT with mild pallor Cardiovascular with S1 and S2 present and rhythmic with no gallops, or rubs, positive systolic murmur at the base. Respiratory with no rales or wheezing, no rhonchi Abdomen with no distention  Trace non pitting lower extremity edema   Condition at discharge: stable  The results of significant diagnostics from this hospitalization (including imaging, microbiology, ancillary and laboratory) are listed below for reference.   Imaging Studies: DG CHEST PORT 1 VIEW  Result Date: 10/27/2022 CLINICAL DATA:  Pleural effusion. EXAM: PORTABLE CHEST 1 VIEW COMPARISON:  None Available. FINDINGS: The heart size and mediastinal contours are within normal limits. Left-sided pacemaker is unchanged in position. Left lung is clear. Stable right basilar atelectasis or infiltrate is noted with associated pleural effusion. The visualized skeletal structures are unremarkable. IMPRESSION: Stable right basilar atelectasis or infiltrate is noted with associated pleural effusion. Electronically Signed   By: Marijo Conception M.D.   On: 10/27/2022 09:32   ECHOCARDIOGRAM COMPLETE  Result Date: 10/26/2022    ECHOCARDIOGRAM REPORT   Patient Name:   HARUKO HERN Date of Exam: 10/26/2022 Medical Rec #:  [id number]       Height:       66.0 in Accession #:    [id number]      Weight:       164.0 lb  Date of Birth:  09/04/1945        BSA:          1.838 m Patient Age:    26 years        BP:           121/54 mmHg Patient Gender: F  HR:           79 bpm. Exam Location:  [facility] Procedure: 2D Echo, Cardiac Doppler and Color Doppler Indications:    Aortic valve disorder I35.9  History:        Patient has prior history of Echocardiogram examinations, most                 recent 09/20/2021. Pacemaker, Aortic Valve Disease,                 Arrythmias:Atrial Fibrillation; Risk Factors:Hypertension,                 Diabetes, Former Smoker and Dyslipidemia.  Sonographer:    Greer Pickerel Referring Phys: [id number] DAWOOD Graciela Husbands  Sonographer Comments: Suboptimal parasternal window. Image acquisition challenging due to patient body habitus and Image acquisition challenging due to respiratory motion. IMPRESSIONS  1. Left ventricular ejection fraction, by estimation, is 60 to 65%. The left ventricle has normal function. The left ventricle has no regional wall motion abnormalities. Left ventricular diastolic parameters are indeterminate. Elevated left atrial pressure.  2. Right ventricular systolic function is normal. The right ventricular size is normal. Tricuspid regurgitation signal is inadequate for assessing PA pressure.  3. Left atrial size was moderately dilated.  4. The mitral valve is abnormal. Mild mitral valve regurgitation. No evidence of mitral stenosis.  5. The aortic valve was not well visualized. There is mild calcification of the aortic valve. There is mild thickening of the aortic valve. Aortic valve regurgitation is not visualized. Mild to moderate aortic valve stenosis. Aortic valve mean gradient measures 12.5 mmHg. Aortic valve peak gradient measures 22.5 mmHg. Aortic valve area, by VTI measures 1.44 cm.  6. The inferior vena cava is normal in size with greater than 50% respiratory variability, suggesting right atrial pressure of 3 mmHg. FINDINGS  Left Ventricle: Left ventricular ejection  fraction, by estimation, is 60 to 65%. The left ventricle has normal function. The left ventricle has no regional wall motion abnormalities. The left ventricular internal cavity size was normal in size. There is  no left ventricular hypertrophy. Left ventricular diastolic parameters are indeterminate. Elevated left atrial pressure. Right Ventricle: The right ventricular size is normal. Right vetricular wall thickness was not well visualized. Right ventricular systolic function is normal. Tricuspid regurgitation signal is inadequate for assessing PA pressure. Left Atrium: Left atrial size was moderately dilated. Right Atrium: Right atrial size was not well visualized. Pericardium: There is no evidence of pericardial effusion. Mitral Valve: The mitral valve is abnormal. There is mild thickening of the mitral valve leaflet(s). There is mild calcification of the mitral valve leaflet(s). Mild mitral annular calcification. Mild mitral valve regurgitation. No evidence of mitral valve stenosis. MV peak gradient, 8.3 mmHg. The mean mitral valve gradient is 4.0 mmHg. Tricuspid Valve: The tricuspid valve is normal in structure. Tricuspid valve regurgitation is trivial. No evidence of tricuspid stenosis. Aortic Valve: The aortic valve was not well visualized. There is mild calcification of the aortic valve. There is mild thickening of the aortic valve. There is mild aortic valve annular calcification. Aortic valve regurgitation is not visualized. Mild to  moderate aortic stenosis is present. Aortic valve mean gradient measures 12.5 mmHg. Aortic valve peak gradient measures 22.5 mmHg. Aortic valve area, by VTI measures 1.44 cm. Pulmonic Valve: The pulmonic valve was not well visualized. Pulmonic valve regurgitation is not visualized. No evidence of pulmonic stenosis. Aorta: The aortic root is normal in size and structure. Venous: The  inferior vena cava is normal in size with greater than 50% respiratory variability, suggesting  right atrial pressure of 3 mmHg. IAS/Shunts: The interatrial septum was not well visualized. Additional Comments: A device lead is visualized.  LEFT VENTRICLE PLAX 2D LVIDd:         6.10 cm   Diastology LVIDs:         4.30 cm   LV e' medial:    7.23 cm/s LV PW:         0.50 cm   LV E/e' medial:  21.4 LV IVS:        0.60 cm   LV e' lateral:   9.20 cm/s LVOT diam:     1.90 cm   LV E/e' lateral: 16.8 LV SV:         79 LV SV Index:   43 LVOT Area:     2.84 cm  RIGHT VENTRICLE RV S prime:     14.50 cm/s TAPSE (M-mode): 1.8 cm LEFT ATRIUM             Index        RIGHT ATRIUM           Index LA diam:        4.20 cm 2.29 cm/m   RA Area:     16.30 cm LA Vol (A2C):   74.3 ml 40.42 ml/m  RA Volume:   42.60 ml  23.18 ml/m LA Vol (A4C):   71.7 ml 39.01 ml/m LA Biplane Vol: 76.7 ml 41.73 ml/m  AORTIC VALVE AV Area (Vmax):    1.42 cm AV Area (Vmean):   1.38 cm AV Area (VTI):     1.44 cm AV Vmax:           237.00 cm/s AV Vmean:          166.000 cm/s AV VTI:            0.551 m AV Peak Grad:      22.5 mmHg AV Mean Grad:      12.5 mmHg LVOT Vmax:         119.00 cm/s LVOT Vmean:        81.000 cm/s LVOT VTI:          0.279 m LVOT/AV VTI ratio: 0.51  AORTA Ao Root diam: 3.40 cm Ao Asc diam:  3.80 cm MITRAL VALVE MV Area (PHT): 4.63 cm     SHUNTS MV Peak grad:  8.3 mmHg     Systemic VTI:  0.28 m MV Mean grad:  4.0 mmHg     Systemic Diam: 1.90 cm MV Vmax:       1.44 m/s MV Vmean:      97.1 cm/s MV Decel Time: 164 msec MR Peak grad: 124.5 mmHg MR Vmax:      558.00 cm/s MV E velocity: 155.00 cm/s MV A velocity: 103.00 cm/s MV E/A ratio:  1.50 Carlyle Dolly MD Electronically signed by Carlyle Dolly MD Signature Date/Time: 10/26/2022/12:06:32 PM    Final    DG Chest 1 View  Result Date: 10/26/2022 CLINICAL DATA:  RIGHT pleural effusion post thoracentesis EXAM: CHEST  1 VIEW COMPARISON:  10/25/2022 FINDINGS: LEFT pacer Normal heart size, mediastinal contours, and pulmonary vascularity. Decreased RIGHT pleural effusion and  basilar atelectasis post thoracentesis. No pneumothorax. LEFT lung remains clear. IMPRESSION: No pneumothorax following thoracentesis. Electronically Signed   By: Lavonia Dana M.D.   On: 10/26/2022 12:01   US THORACENTESIS ASP PLEURAL SPACE W/IMG GUIDE  Result  Date: 10/26/2022 INDICATION: Right pleural effusion EXAM: ULTRASOUND GUIDED RIGHT THORACENTESIS MEDICATIONS: 10 cc 1% lidocaine. COMPLICATIONS: None immediate. PROCEDURE: An ultrasound guided thoracentesis was thoroughly discussed with the patient and questions answered. The benefits, risks, alternatives and complications were also discussed. The patient understands and wishes to proceed with the procedure. Written consent was obtained. Ultrasound was performed to localize and mark an adequate pocket of fluid in the right chest. The area was then prepped and draped in the normal sterile fashion. 1% Lidocaine was used for local anesthesia. Under ultrasound guidance a 19 gauge, 7-cm, Yueh catheter was introduced. Thoracentesis was performed. The catheter was removed and a dressing applied. FINDINGS: A total of approximately 1.2 liters of dark yellow hazy fluid was removed. Samples were sent to the laboratory as requested by the clinical team. IMPRESSION: Successful ultrasound guided right thoracentesis yielding dark yellow hazy of pleural fluid. Read by Lavonia Drafts [facility] Electronically Signed   By: Lavonia Dana M.D.   On: 10/26/2022 11:35   CT ABDOMEN PELVIS W CONTRAST  Result Date: 10/25/2022 CLINICAL DATA:  Abdominal pain. EXAM: CT ABDOMEN AND PELVIS WITH CONTRAST TECHNIQUE: Multidetector CT imaging of the abdomen and pelvis was performed using the standard protocol following bolus administration of intravenous contrast. RADIATION DOSE REDUCTION: This exam was performed according to the departmental dose-optimization program which includes automated exposure control, adjustment of the mA and/or kV according to patient size and/or use of iterative  reconstruction technique. CONTRAST:  6mL OMNIPAQUE IOHEXOL 300 MG/ML  SOLN COMPARISON:  CT abdomen pelvis dated 02/26/2018. FINDINGS: Motion limits the sensitivity of the exam. Lower chest: There is a large right pleural effusion with associated atelectasis. The heart is enlarged. Hepatobiliary: No focal liver abnormality is seen. Status post cholecystectomy. No biliary dilatation. Pancreas: Unremarkable. No pancreatic ductal dilatation or surrounding inflammatory changes. Spleen: The spleen is enlarged, similar to prior exam. Adrenals/Urinary Tract: Adrenal glands are unremarkable. Cysts in the left kidney measure up to 6 cm in size, unchanged from prior exam. No imaging follow-up is recommended for this finding. No focal lesions in the right kidney. No renal calculi on either side. No hydronephrosis. Bladder is unremarkable. Stomach/Bowel: Stomach is within normal limits. The appendix is not definitely identified, however no pericecal inflammatory changes are noted to suggest acute appendicitis. There is colonic diverticulosis without evidence of diverticulitis. No evidence of bowel wall thickening, distention, or inflammatory changes. Vascular/Lymphatic: The bilateral ovarian veins are dilated and tortuous. No lymphadenopathy in the abdomen or pelvis. Reproductive: Status post hysterectomy. No adnexal masses. Other: No abdominal wall hernia or abnormality. No abdominopelvic ascites. Musculoskeletal: There is a chronic appearing compression fracture of L1 with 3 mm retropulsion into the central canal and approximately 50-75% height loss centrally. Degenerative changes are seen in the spine. IMPRESSION: 1. No acute process in the abdomen or pelvis. 2. Large right pleural effusion with associated atelectasis. 3. Dilated and tortuous bilateral ovarian veins can be seen in the setting of pelvic congestion syndrome. 4. Chronic appearing compression fracture of L1 with 3 mm retropulsion into the central canal.  Electronically Signed   By: Zerita Boers M.D.   On: 10/25/2022 17:58   CT Cervical Spine Wo Contrast  Result Date: 10/25/2022 CLINICAL DATA:  Neck trauma (Age >= 65y) EXAM: CT CERVICAL SPINE WITHOUT CONTRAST TECHNIQUE: Multidetector CT imaging of the cervical spine was performed without intravenous contrast. Multiplanar CT image reconstructions were also generated. RADIATION DOSE REDUCTION: This exam was performed according to the departmental dose-optimization program which  includes automated exposure control, adjustment of the mA and/or kV according to patient size and/or use of iterative reconstruction technique. COMPARISON:  None Available. FINDINGS: Motion degraded exam. Alignment: Degenerative grade 1 anterolisthesis at C4-C5. Skull base and vertebrae: Extensive motion degradation which limits evaluation. Within this limitation, there is no evidence of cervical spine fracture. Soft tissues and spinal canal: No prevertebral fluid or swelling. No visible canal hematoma. Disc levels: Moderate degenerative disc disease at C5-C6. Mild-to-moderate multilevel facet arthropathy. Upper chest: Layering right pleural effusion to the apex. Other: None. IMPRESSION: Motion degraded exam which limits evaluation. Within this limitation, there is no definite cervical spine fracture. If there is significant clinical concern, repeat exam should be considered. Electronically Signed   By: Maurine Simmering M.D.   On: 10/25/2022 17:54   CT Head Wo Contrast  Result Date: 10/25/2022 CLINICAL DATA:  Trauma EXAM: CT HEAD WITHOUT CONTRAST TECHNIQUE: Contiguous axial images were obtained from the base of the skull through the vertex without intravenous contrast. RADIATION DOSE REDUCTION: This exam was performed according to the departmental dose-optimization program which includes automated exposure control, adjustment of the mA and/or kV according to patient size and/or use of iterative reconstruction technique. COMPARISON:  Head CT  08/25/2022 FINDINGS: Brain: No evidence of acute infarction, hemorrhage, hydrocephalus, extra-axial collection or mass lesion/mass effect. Vascular: Atherosclerotic calcifications are present within the cavernous internal carotid arteries. Skull: Normal. Negative for fracture or focal lesion. Sinuses/Orbits: No acute finding. Other: None. IMPRESSION: No acute intracranial abnormality. Electronically Signed   By: Ronney Asters M.D.   On: 10/25/2022 17:49   DG Chest Port 1 View  Result Date: 10/25/2022 CLINICAL DATA:  Fall, pain, shortness of breath EXAM: PORTABLE CHEST 1 VIEW COMPARISON:  03/13/2014 FINDINGS: Redemonstrated left chest cardiac device with leads overlying the right atrium and ventricle. Enlarged cardiac silhouette, although the right heart border is incompletely imaged. Moderate right pleural effusion with associated atelectasis. No left pleural effusion. Increased interstitial prominence, possibly interstitial pulmonary edema. No acute osseous abnormalities. Degenerative changes in the bilateral shoulders. IMPRESSION: 1. Moderate right pleural effusion with associated atelectasis. 2. Increased interstitial prominence, possibly interstitial pulmonary edema. 3. Concern for mild cardiomegaly, although the heart borders are incompletely seen. Electronically Signed   By: Merilyn Baba M.D.   On: 10/25/2022 16:15   DG Pelvis Portable  Result Date: 10/25/2022 CLINICAL DATA:  Fall with pain EXAM: PORTABLE PELVIS 1 VIEWS COMPARISON:  None Available. FINDINGS: There is no evidence of pelvic fracture or diastasis. No pelvic bone lesions are seen. Degenerative changes of the bilateral hips. Rounded soft tissue density projecting over the pelvis, likely distended urinary bladder. IMPRESSION: No acute osseous abnormality identified. Electronically Signed   By: Darrin Nipper M.D.   On: 10/25/2022 16:15    Microbiology: Results for orders placed or performed during the hospital encounter of 10/25/22  Gram  stain     Status: None   Collection Time: 10/26/22 11:15 AM   Specimen: Ascitic  Result Value Ref Range Status   Specimen Description ASCITIC  Final   Special Requests NONE  Final   Gram Stain   Final    NO ORGANISMS SEEN WBC PRESENT, PREDOMINANTLY MONONUCLEAR CYTOSPIN SMEAR Performed at [facility], [address] [place], [place] [address]    Report Status 10/26/2022 FINAL  Final  Culture, body fluid w Gram Stain-bottle     Status: None (Preliminary result)   Collection Time: 10/26/22 11:15 AM   Specimen: Ascitic  Result Value Ref Range Status  Specimen Description ASCITIC  Final   Special Requests BAA 10CC  Final   Culture   Final    NO GROWTH 1 DAY Performed at [facility], [address] [place], [place] [address]    Report Status PENDING  Incomplete    Labs: CBC: Recent Labs  Lab 10/25/22 1609 10/25/22 2201 10/26/22 0442 10/26/22 1355 10/26/22 2204 10/27/22 0511  WBC 5.7  --  5.5  --   --  6.7  NEUTROABS 4.8  --   --   --   --  4.6  HGB 9.1* 8.4* 8.2* 8.8* 9.3* 8.7*  HCT 28.4* 26.0* 25.7* 28.0* 28.7* 27.7*  MCV 86.3  --  86.2  --   --  86.3  PLT 118*  --  122*  --   --  [id number]   Basic Metabolic Panel: Recent Labs  Lab 10/25/22 1606 10/25/22 1609 10/26/22 0442 10/27/22 0511  NA 144 140 141 139  K 4.3 4.2 3.8 3.6  CL 110 108 110 106  CO2  --  21* 23 24  GLUCOSE 132* 138* 106* 116*  BUN 32* 35* 32* 30*  CREATININE 1.60* 1.60* 1.54* 1.63*  CALCIUM  --  9.0 8.8* 8.5*  MG  --   --   --  1.6*   Liver Function Tests: Recent Labs  Lab 10/25/22 1609 10/26/22 0442  AST 36 35  ALT 19 16  ALKPHOS 80 64  BILITOT 1.1 1.5*  PROT 7.6 6.6  ALBUMIN 3.5 3.0*   CBG: Recent Labs  Lab 10/26/22 1151 10/26/22 1624 10/26/22 2141 10/27/22 0733 10/27/22 1122  GLUCAP 139* 134* 123* 127* 118*    Discharge time spent: greater than 30 minutes.  Signed: Tawni Millers, MD [facility] 10/27/2022

## 2022-10-27 NOTE — Transfer of Care (Signed)
Immediate Anesthesia Transfer of Care Note  Patient: Tracey Koch  Procedure(s) Performed: ESOPHAGOGASTRODUODENOSCOPY (EGD) WITH PROPOFOL BIOPSY  Patient Location: PACU  Anesthesia Type:General  Level of Consciousness: sedated and drowsy  Airway & Oxygen Therapy: Patient Spontanous Breathing and Patient connected to nasal cannula oxygen  Post-op Assessment: Report given to RN and Post -op Vital signs reviewed and stable  Post vital signs: Reviewed and stable  Last Vitals:  Vitals Value Taken Time  BP 12/58 10/27/22  1319  Temp 98 10/27/22   1322  Pulse 91 10/27/22 1319  Resp 17 10/27/22 1319  SpO2 100 % 10/27/22 1319  Vitals shown include unvalidated device data.  Last Pain:  Vitals:   10/27/22 1250  TempSrc:   PainSc: 0-No pain         Complications: No notable events documented.

## 2022-10-27 NOTE — Op Note (Signed)
[facility] Patient Name: Tracey Koch Procedure Date: 10/27/2022 12:39 PM MRN: [id number] Date of Birth: July 13, 1946 Attending MD: Maylon Peppers , , [id number] CSN: [id number] Age: 77 Admit Type: Inpatient Procedure:                Upper GI endoscopy Indications:              Melena Providers:                Maylon Peppers, Charlsie Quest. Theda Sers RN, RN, Randa Spike, Technician Referring MD:              Medicines:                Monitored Anesthesia Care Complications:            No immediate complications. Estimated Blood Loss:     Estimated blood loss: none. Procedure:                Pre-Anesthesia Assessment:                           - Prior to the procedure, a History and Physical                            was performed, and patient medications, allergies                            and sensitivities were reviewed. The patient's                            tolerance of previous anesthesia was reviewed.                           - The risks and benefits of the procedure and the                            sedation options and risks were discussed with the                            patient. All questions were answered and informed                            consent was obtained.                           - ASA Grade Assessment: III - A patient with severe                            systemic disease.                           After obtaining informed consent, the endoscope was                            passed under direct vision. Throughout the  procedure, the patient's blood pressure, pulse, and                            oxygen saturations were monitored continuously. The                            [id number] scope was introduced through the                            mouth, and advanced to the second part of duodenum.                            The upper GI endoscopy was accomplished without                             difficulty. The patient tolerated the procedure                            well. Scope In: 1:02:23 PM Scope Out: 1:11:03 PM Total Procedure Duration: 0 hours 8 minutes 40 seconds  Findings:      The examined esophagus was normal.      The gastroesophageal flap valve was visualized endoscopically and       classified as Hill Grade II (fold present, opens with respiration).      Diffuse mildly congested mucosa was found in the gastric antrum.       Biopsies were taken with a cold forceps for Helicobacter pylori testing.      The examined duodenum was normal. Impression:               - Normal esophagus.                           - Congestive gastropathy. Biopsied.                           - Normal examined duodenum. Moderate Sedation:      Per Anesthesia Care Recommendation:           - Return patient to hospital ward for ongoing care.                           - Resume previous diet.                           - Check iron stores, if low start oral iron                            supplementation.                           - Schedule outpatient colonoscopy.                           - OK to restart Eliquis. Procedure Code(s):        --- Professional ---  [id number], Esophagogastroduodenoscopy, flexible,                            transoral; with biopsy, single or multiple Diagnosis Code(s):        --- Professional ---                           K31.89, Other diseases of stomach and duodenum                           K92.1, Melena (includes Hematochezia) CPT copyright 2022 American Medical Association. All rights reserved. The codes documented in this report are preliminary and upon coder review may  be revised to meet current compliance requirements. Maylon Peppers, MD Maylon Peppers,  10/27/2022 1:21:49 PM This report has been signed electronically. Number of Addenda: 0

## 2022-10-27 NOTE — Telephone Encounter (Signed)
Hi Mitzie,  Can you please schedule a follow up appointment for this patient in 3-4 weeks with me or any of the APPs?  Thanks,  Shauntae Reitman Castaneda, MD Gastroenterology and Hepatology [facility]  

## 2022-10-29 ENCOUNTER — Telehealth (INDEPENDENT_AMBULATORY_CARE_PROVIDER_SITE_OTHER): Payer: Self-pay | Admitting: Gastroenterology

## 2022-10-29 NOTE — Telephone Encounter (Signed)
Hi Tanya,   Can you please schedule a colonoscopy in next available? Dx: severe iron deficiency anemia. Room: 3. No need for Eliquis clearance, must hold it for 2 days prior to procedure.  Thanks,  Maylon Peppers, MD Gastroenterology and Hepatology [facility]

## 2022-10-30 LAB — CYTOLOGY - NON PAP

## 2022-10-30 MED ORDER — PEG 3350-KCL-NA BICARB-NACL 420 G PO SOLR: 4000.0000 mL | Freq: Once | ORAL | 0 refills | Status: AC

## 2022-10-30 NOTE — Addendum Note (Signed)
Addended by: Vicente Males on: 10/30/2022 09:27 AM   Modules accepted: Orders

## 2022-10-30 NOTE — Telephone Encounter (Signed)
TCS scheduled for 11/21/22. (Pt is having teeth pulled on 11/14/22). Instructions will be mailed. Advised to hold Eliquis x 2 days prior. Prep sent to pharmacy   PA approved Authorization [id number]  Tracking [id number] DOS 10/30/2022 - 01/29/2023

## 2022-11-01 LAB — CULTURE, BODY FLUID W GRAM STAIN -BOTTLE: Culture: NO GROWTH

## 2022-11-03 DIAGNOSIS — I5033 Acute on chronic diastolic (congestive) heart failure: Secondary | ICD-10-CM | POA: Diagnosis not present

## 2022-11-03 DIAGNOSIS — I130 Hypertensive heart and chronic kidney disease with heart failure and stage 1 through stage 4 chronic kidney disease, or unspecified chronic kidney disease: Secondary | ICD-10-CM | POA: Diagnosis not present

## 2022-11-03 DIAGNOSIS — I482 Chronic atrial fibrillation, unspecified: Secondary | ICD-10-CM | POA: Diagnosis not present

## 2022-11-03 DIAGNOSIS — D62 Acute posthemorrhagic anemia: Secondary | ICD-10-CM | POA: Diagnosis not present

## 2022-11-03 DIAGNOSIS — D631 Anemia in chronic kidney disease: Secondary | ICD-10-CM | POA: Diagnosis not present

## 2022-11-03 DIAGNOSIS — T39395A Adverse effect of other nonsteroidal anti-inflammatory drugs [NSAID], initial encounter: Secondary | ICD-10-CM | POA: Diagnosis not present

## 2022-11-03 DIAGNOSIS — E1122 Type 2 diabetes mellitus with diabetic chronic kidney disease: Secondary | ICD-10-CM | POA: Diagnosis not present

## 2022-11-03 DIAGNOSIS — I480 Paroxysmal atrial fibrillation: Secondary | ICD-10-CM | POA: Diagnosis not present

## 2022-11-03 DIAGNOSIS — R609 Edema, unspecified: Secondary | ICD-10-CM | POA: Diagnosis not present

## 2022-11-03 DIAGNOSIS — D696 Thrombocytopenia, unspecified: Secondary | ICD-10-CM | POA: Diagnosis not present

## 2022-11-03 DIAGNOSIS — I350 Nonrheumatic aortic (valve) stenosis: Secondary | ICD-10-CM | POA: Diagnosis not present

## 2022-11-03 DIAGNOSIS — K922 Gastrointestinal hemorrhage, unspecified: Secondary | ICD-10-CM | POA: Diagnosis not present

## 2022-11-03 DIAGNOSIS — J439 Emphysema, unspecified: Secondary | ICD-10-CM | POA: Diagnosis not present

## 2022-11-03 DIAGNOSIS — S32010A Wedge compression fracture of first lumbar vertebra, initial encounter for closed fracture: Secondary | ICD-10-CM | POA: Diagnosis not present

## 2022-11-03 DIAGNOSIS — N189 Chronic kidney disease, unspecified: Secondary | ICD-10-CM | POA: Diagnosis not present

## 2022-11-05 LAB — SURGICAL PATHOLOGY

## 2022-11-06 ENCOUNTER — Encounter (HOSPITAL_COMMUNITY): Payer: Self-pay | Admitting: Gastroenterology

## 2022-11-07 DIAGNOSIS — N189 Chronic kidney disease, unspecified: Secondary | ICD-10-CM | POA: Diagnosis not present

## 2022-11-07 DIAGNOSIS — I5033 Acute on chronic diastolic (congestive) heart failure: Secondary | ICD-10-CM | POA: Diagnosis not present

## 2022-11-07 DIAGNOSIS — K922 Gastrointestinal hemorrhage, unspecified: Secondary | ICD-10-CM | POA: Diagnosis not present

## 2022-11-07 DIAGNOSIS — I350 Nonrheumatic aortic (valve) stenosis: Secondary | ICD-10-CM | POA: Diagnosis not present

## 2022-11-07 DIAGNOSIS — E1122 Type 2 diabetes mellitus with diabetic chronic kidney disease: Secondary | ICD-10-CM | POA: Diagnosis not present

## 2022-11-07 DIAGNOSIS — I480 Paroxysmal atrial fibrillation: Secondary | ICD-10-CM | POA: Diagnosis not present

## 2022-11-07 DIAGNOSIS — D696 Thrombocytopenia, unspecified: Secondary | ICD-10-CM | POA: Diagnosis not present

## 2022-11-07 DIAGNOSIS — I130 Hypertensive heart and chronic kidney disease with heart failure and stage 1 through stage 4 chronic kidney disease, or unspecified chronic kidney disease: Secondary | ICD-10-CM | POA: Diagnosis not present

## 2022-11-07 DIAGNOSIS — D631 Anemia in chronic kidney disease: Secondary | ICD-10-CM | POA: Diagnosis not present

## 2022-11-08 ENCOUNTER — Telehealth (INDEPENDENT_AMBULATORY_CARE_PROVIDER_SITE_OTHER): Payer: Self-pay | Admitting: Gastroenterology

## 2022-11-08 NOTE — Telephone Encounter (Signed)
Pt contacted and made aware of pre op appt that is scheduled for 11/17/22 at 11:00 am in person [facility]

## 2022-11-09 DIAGNOSIS — D696 Thrombocytopenia, unspecified: Secondary | ICD-10-CM | POA: Diagnosis not present

## 2022-11-09 DIAGNOSIS — E1122 Type 2 diabetes mellitus with diabetic chronic kidney disease: Secondary | ICD-10-CM | POA: Diagnosis not present

## 2022-11-09 DIAGNOSIS — I130 Hypertensive heart and chronic kidney disease with heart failure and stage 1 through stage 4 chronic kidney disease, or unspecified chronic kidney disease: Secondary | ICD-10-CM | POA: Diagnosis not present

## 2022-11-09 DIAGNOSIS — K922 Gastrointestinal hemorrhage, unspecified: Secondary | ICD-10-CM | POA: Diagnosis not present

## 2022-11-09 DIAGNOSIS — I480 Paroxysmal atrial fibrillation: Secondary | ICD-10-CM | POA: Diagnosis not present

## 2022-11-09 DIAGNOSIS — I350 Nonrheumatic aortic (valve) stenosis: Secondary | ICD-10-CM | POA: Diagnosis not present

## 2022-11-09 DIAGNOSIS — I5033 Acute on chronic diastolic (congestive) heart failure: Secondary | ICD-10-CM | POA: Diagnosis not present

## 2022-11-09 DIAGNOSIS — N189 Chronic kidney disease, unspecified: Secondary | ICD-10-CM | POA: Diagnosis not present

## 2022-11-09 DIAGNOSIS — D631 Anemia in chronic kidney disease: Secondary | ICD-10-CM | POA: Diagnosis not present

## 2022-11-13 DIAGNOSIS — K922 Gastrointestinal hemorrhage, unspecified: Secondary | ICD-10-CM | POA: Diagnosis not present

## 2022-11-13 DIAGNOSIS — N184 Chronic kidney disease, stage 4 (severe): Secondary | ICD-10-CM | POA: Diagnosis not present

## 2022-11-13 DIAGNOSIS — R296 Repeated falls: Secondary | ICD-10-CM | POA: Diagnosis not present

## 2022-11-13 DIAGNOSIS — I10 Essential (primary) hypertension: Secondary | ICD-10-CM | POA: Diagnosis not present

## 2022-11-13 DIAGNOSIS — R5383 Other fatigue: Secondary | ICD-10-CM | POA: Diagnosis not present

## 2022-11-13 DIAGNOSIS — D631 Anemia in chronic kidney disease: Secondary | ICD-10-CM | POA: Diagnosis not present

## 2022-11-13 DIAGNOSIS — I5033 Acute on chronic diastolic (congestive) heart failure: Secondary | ICD-10-CM | POA: Diagnosis not present

## 2022-11-13 DIAGNOSIS — D696 Thrombocytopenia, unspecified: Secondary | ICD-10-CM | POA: Diagnosis not present

## 2022-11-13 DIAGNOSIS — E1122 Type 2 diabetes mellitus with diabetic chronic kidney disease: Secondary | ICD-10-CM | POA: Diagnosis not present

## 2022-11-13 DIAGNOSIS — Z6827 Body mass index (BMI) 27.0-27.9, adult: Secondary | ICD-10-CM | POA: Diagnosis not present

## 2022-11-13 DIAGNOSIS — I480 Paroxysmal atrial fibrillation: Secondary | ICD-10-CM | POA: Diagnosis not present

## 2022-11-13 DIAGNOSIS — D649 Anemia, unspecified: Secondary | ICD-10-CM | POA: Diagnosis not present

## 2022-11-13 DIAGNOSIS — N189 Chronic kidney disease, unspecified: Secondary | ICD-10-CM | POA: Diagnosis not present

## 2022-11-13 DIAGNOSIS — I350 Nonrheumatic aortic (valve) stenosis: Secondary | ICD-10-CM | POA: Diagnosis not present

## 2022-11-13 DIAGNOSIS — I130 Hypertensive heart and chronic kidney disease with heart failure and stage 1 through stage 4 chronic kidney disease, or unspecified chronic kidney disease: Secondary | ICD-10-CM | POA: Diagnosis not present

## 2022-11-15 DIAGNOSIS — I130 Hypertensive heart and chronic kidney disease with heart failure and stage 1 through stage 4 chronic kidney disease, or unspecified chronic kidney disease: Secondary | ICD-10-CM | POA: Diagnosis not present

## 2022-11-15 DIAGNOSIS — D696 Thrombocytopenia, unspecified: Secondary | ICD-10-CM | POA: Diagnosis not present

## 2022-11-15 DIAGNOSIS — N189 Chronic kidney disease, unspecified: Secondary | ICD-10-CM | POA: Diagnosis not present

## 2022-11-15 DIAGNOSIS — I480 Paroxysmal atrial fibrillation: Secondary | ICD-10-CM | POA: Diagnosis not present

## 2022-11-15 DIAGNOSIS — D631 Anemia in chronic kidney disease: Secondary | ICD-10-CM | POA: Diagnosis not present

## 2022-11-15 DIAGNOSIS — K922 Gastrointestinal hemorrhage, unspecified: Secondary | ICD-10-CM | POA: Diagnosis not present

## 2022-11-15 DIAGNOSIS — I350 Nonrheumatic aortic (valve) stenosis: Secondary | ICD-10-CM | POA: Diagnosis not present

## 2022-11-15 DIAGNOSIS — E1122 Type 2 diabetes mellitus with diabetic chronic kidney disease: Secondary | ICD-10-CM | POA: Diagnosis not present

## 2022-11-15 DIAGNOSIS — I5033 Acute on chronic diastolic (congestive) heart failure: Secondary | ICD-10-CM | POA: Diagnosis not present

## 2022-11-17 ENCOUNTER — Encounter (HOSPITAL_COMMUNITY)
Admission: RE | Admit: 2022-11-17 | Discharge: 2022-11-17 | Disposition: A | Discharge status: Home / Self Care | Payer: Medicare PPO | Source: Ambulatory Visit | Attending: Gastroenterology | Admitting: Gastroenterology

## 2022-11-21 ENCOUNTER — Ambulatory Visit (HOSPITAL_COMMUNITY)
Admission: RE | Admit: 2022-11-21 | Discharge: 2022-11-21 | Disposition: A | Discharge status: Home / Self Care | Payer: Medicare PPO | Attending: Gastroenterology | Admitting: Gastroenterology

## 2022-11-21 ENCOUNTER — Encounter (HOSPITAL_COMMUNITY)
Admission: RE | Disposition: A | Discharge status: Home / Self Care | Payer: Self-pay | Source: Home / Self Care | Attending: Gastroenterology

## 2022-11-21 ENCOUNTER — Ambulatory Visit (HOSPITAL_BASED_OUTPATIENT_CLINIC_OR_DEPARTMENT_OTHER): Payer: Medicare PPO | Admitting: Anesthesiology

## 2022-11-21 ENCOUNTER — Ambulatory Visit (HOSPITAL_COMMUNITY): Payer: Medicare PPO | Admitting: Anesthesiology

## 2022-11-21 ENCOUNTER — Encounter (INDEPENDENT_AMBULATORY_CARE_PROVIDER_SITE_OTHER): Payer: Self-pay | Admitting: *Deleted

## 2022-11-21 ENCOUNTER — Encounter (HOSPITAL_COMMUNITY): Payer: Self-pay | Admitting: Gastroenterology

## 2022-11-21 DIAGNOSIS — I110 Hypertensive heart disease with heart failure: Secondary | ICD-10-CM

## 2022-11-21 DIAGNOSIS — D509 Iron deficiency anemia, unspecified: Secondary | ICD-10-CM | POA: Diagnosis not present

## 2022-11-21 DIAGNOSIS — D122 Benign neoplasm of ascending colon: Secondary | ICD-10-CM | POA: Diagnosis not present

## 2022-11-21 DIAGNOSIS — E119 Type 2 diabetes mellitus without complications: Secondary | ICD-10-CM | POA: Diagnosis not present

## 2022-11-21 DIAGNOSIS — K635 Polyp of colon: Secondary | ICD-10-CM | POA: Diagnosis not present

## 2022-11-21 DIAGNOSIS — K648 Other hemorrhoids: Secondary | ICD-10-CM

## 2022-11-21 DIAGNOSIS — Z7984 Long term (current) use of oral hypoglycemic drugs: Secondary | ICD-10-CM | POA: Insufficient documentation

## 2022-11-21 DIAGNOSIS — Z87891 Personal history of nicotine dependence: Secondary | ICD-10-CM

## 2022-11-21 DIAGNOSIS — Z79899 Other long term (current) drug therapy: Secondary | ICD-10-CM | POA: Diagnosis not present

## 2022-11-21 DIAGNOSIS — K5730 Diverticulosis of large intestine without perforation or abscess without bleeding: Secondary | ICD-10-CM

## 2022-11-21 DIAGNOSIS — I509 Heart failure, unspecified: Secondary | ICD-10-CM

## 2022-11-21 DIAGNOSIS — M069 Rheumatoid arthritis, unspecified: Secondary | ICD-10-CM | POA: Insufficient documentation

## 2022-11-21 DIAGNOSIS — Z950 Presence of cardiac pacemaker: Secondary | ICD-10-CM | POA: Diagnosis not present

## 2022-11-21 DIAGNOSIS — D759 Disease of blood and blood-forming organs, unspecified: Secondary | ICD-10-CM | POA: Insufficient documentation

## 2022-11-21 DIAGNOSIS — I350 Nonrheumatic aortic (valve) stenosis: Secondary | ICD-10-CM | POA: Diagnosis not present

## 2022-11-21 DIAGNOSIS — K921 Melena: Secondary | ICD-10-CM

## 2022-11-21 DIAGNOSIS — K5520 Angiodysplasia of colon without hemorrhage: Secondary | ICD-10-CM

## 2022-11-21 DIAGNOSIS — Z7901 Long term (current) use of anticoagulants: Secondary | ICD-10-CM | POA: Insufficient documentation

## 2022-11-21 DIAGNOSIS — N289 Disorder of kidney and ureter, unspecified: Secondary | ICD-10-CM | POA: Diagnosis not present

## 2022-11-21 DIAGNOSIS — I4891 Unspecified atrial fibrillation: Secondary | ICD-10-CM | POA: Insufficient documentation

## 2022-11-21 DIAGNOSIS — D500 Iron deficiency anemia secondary to blood loss (chronic): Secondary | ICD-10-CM

## 2022-11-21 HISTORY — PX: COLONOSCOPY WITH PROPOFOL: SHX5780

## 2022-11-21 HISTORY — PX: POLYPECTOMY: SHX5525

## 2022-11-21 HISTORY — PX: HOT HEMOSTASIS: SHX5433

## 2022-11-21 LAB — CBC
HCT: 27.4 % — ABNORMAL LOW (ref 36.0–46.0)
Hemoglobin: 8.6 g/dL — ABNORMAL LOW (ref 12.0–15.0)
MCH: 28 pg (ref 26.0–34.0)
MCHC: 31.4 g/dL (ref 30.0–36.0)
MCV: 89.3 fL (ref 80.0–100.0)
Platelets: 100 10*3/uL — ABNORMAL LOW (ref 150–400)
RBC: 3.07 MIL/uL — ABNORMAL LOW (ref 3.87–5.11)
RDW: 17.1 % — ABNORMAL HIGH (ref 11.5–15.5)
WBC: 3.6 10*3/uL — ABNORMAL LOW (ref 4.0–10.5)
nRBC: 0 % (ref 0.0–0.2)

## 2022-11-21 LAB — IRON AND TIBC
Iron: 31 ug/dL (ref 28–170)
Saturation Ratios: 7 % — ABNORMAL LOW (ref 10.4–31.8)
TIBC: 426 ug/dL (ref 250–450)
UIBC: 395 ug/dL

## 2022-11-21 LAB — FERRITIN: Ferritin: 18 ng/mL (ref 11–307)

## 2022-11-21 LAB — HM COLONOSCOPY

## 2022-11-21 LAB — GLUCOSE, CAPILLARY: Glucose-Capillary: 102 mg/dL — ABNORMAL HIGH (ref 70–99)

## 2022-11-21 SURGERY — COLONOSCOPY WITH PROPOFOL
Anesthesia: General

## 2022-11-21 MED ORDER — PROPOFOL 500 MG/50ML IV EMUL
INTRAVENOUS | Status: DC | PRN
  Administered 2022-11-21: 125 ug/kg/min via INTRAVENOUS

## 2022-11-21 MED ORDER — PROPOFOL 10 MG/ML IV BOLUS
INTRAVENOUS | Status: DC | PRN
  Administered 2022-11-21: 50 mg via INTRAVENOUS

## 2022-11-21 MED ORDER — LACTATED RINGERS IV SOLN: INTRAVENOUS | Status: DC

## 2022-11-21 MED ORDER — LIDOCAINE HCL 1 % IJ SOLN
INTRAMUSCULAR | Status: DC | PRN
  Administered 2022-11-21: 50 mg via INTRADERMAL

## 2022-11-21 NOTE — Anesthesia Preprocedure Evaluation (Signed)
Anesthesia Evaluation  Patient identified by MRN, date of birth, ID band Patient awake    Reviewed: Allergy & Precautions, H&P , NPO status , Patient's Chart, lab work & pertinent test results  History of Anesthesia Complications (+) Family history of anesthesia reaction and history of anesthetic complications  Airway Mallampati: II  TM Distance: >3 FB Neck ROM: Full    Dental  (+) Missing, Poor Dentition, Dental Advisory Given   Pulmonary shortness of breath, with exertion and at rest, former smoker Right pleural effusion, and thoracentesis and drained more than a liter of fluid.   Narrative & Impression CLINICAL DATA:  RIGHT pleural effusion post thoracentesis   EXAM: CHEST  1 VIEW   COMPARISON:  10/25/2022   FINDINGS: LEFT pacer   Normal heart size, mediastinal contours, and pulmonary vascularity.   Decreased RIGHT pleural effusion and basilar atelectasis post thoracentesis.   No pneumothorax.   LEFT lung remains clear.   IMPRESSION: No pneumothorax following thoracentesis.     Electronically Signed   By: Ulyses Southward M.D.   On: 10/26/2022 12:01          rales    Cardiovascular Exercise Tolerance: Good hypertension, Pt. on medications +CHF  + pacemaker + Valvular Problems/Murmurs AS  Rhythm:Regular Rate:Normal + Systolic murmurs 1. Left ventricular ejection fraction, by estimation, is 60 to 65%. The  left ventricle has normal function. The left ventricle has no regional  wall motion abnormalities. Left ventricular diastolic parameters are  indeterminate. Elevated left atrial  pressure.   2. Right ventricular systolic function is normal. The right ventricular  size is normal. Tricuspid regurgitation signal is inadequate for assessing  PA pressure.   3. Left atrial size was moderately dilated.   4. The mitral valve is abnormal. Mild mitral valve regurgitation. No  evidence of mitral stenosis.   5. The  aortic valve was not well visualized. There is mild calcification  of the aortic valve. There is mild thickening of the aortic valve. Aortic  valve regurgitation is not visualized. Mild to moderate aortic valve  stenosis. Aortic valve mean gradient  measures 12.5 mmHg. Aortic valve peak gradient measures 22.5 mmHg. Aortic  valve area, by VTI measures 1.44 cm.   6. The inferior vena cava is normal in size with greater than 50%  respiratory variability, suggesting right atrial pressure of 3 mmHg.     Neuro/Psych  PSYCHIATRIC DISORDERS  Depression    negative neurological ROS     GI/Hepatic negative GI ROS, Neg liver ROS,,,  Endo/Other  diabetes, Well Controlled, Type 2, Oral Hypoglycemic Agents    Renal/GU Renal disease  negative genitourinary   Musculoskeletal  (+) Arthritis , Rheumatoid disorders,    Abdominal   Peds negative pediatric ROS (+)  Hematology  (+) Blood dyscrasia, anemia   Anesthesia Other Findings   Reproductive/Obstetrics negative OB ROS                             Anesthesia Physical Anesthesia Plan  ASA: 4  Anesthesia Plan: General   Post-op Pain Management: Minimal or no pain anticipated   Induction: Intravenous  PONV Risk Score and Plan: Propofol infusion  Airway Management Planned: Nasal Cannula and Natural Airway  Additional Equipment:   Intra-op Plan:   Post-operative Plan:   Informed Consent: I have reviewed the patients History and Physical, chart, labs and discussed the procedure including the risks, benefits and alternatives for the proposed anesthesia with  the patient or authorized representative who has indicated his/her understanding and acceptance.    Discussed DNR with patient and Suspend DNR.   Dental advisory given  Plan Discussed with: CRNA and Surgeon  Anesthesia Plan Comments: (In case of cardiac arrest, Patient doesn't want prolonged resuscitation, agreed to get brief resuscitation and  agreed to get intubation in case of temporary respiratory distress from anesthesia. )        Anesthesia Quick Evaluation

## 2022-11-21 NOTE — Transfer of Care (Signed)
Immediate Anesthesia Transfer of Care Note  Patient: Tracey Koch  Procedure(s) Performed: COLONOSCOPY WITH PROPOFOL POLYPECTOMY HOT HEMOSTASIS (ARGON PLASMA COAGULATION/BICAP)  Patient Location: Short Stay  Anesthesia Type:General  Level of Consciousness: awake  Airway & Oxygen Therapy: Patient Spontanous Breathing  Post-op Assessment: Report given to RN  Post vital signs: Reviewed and stable  Last Vitals:  Vitals Value Taken Time  BP    Temp    Pulse    Resp    SpO2      Last Pain:  Vitals:   11/21/22 1246  TempSrc:   PainSc: 0-No pain         Complications: No notable events documented.

## 2022-11-21 NOTE — Interval H&P Note (Signed)
History and Physical Interval Note:  11/21/2022 10:51 AM  Patient was hospitalized recently with episode of melena.  EGD while hospitalized showed congestive gastropathy but no presence of active bleeding or stigmata of recent bleeding.  Was held for colonoscopy given presence of anemia upon presentation.  Tracey Koch  has presented today for surgery, with the diagnosis of IRON DEFICIENCY ANEMIA.  The various methods of treatment have been discussed with the patient and family. After consideration of risks, benefits and other options for treatment, the patient has consented to  Procedure(s) with comments: COLONOSCOPY WITH PROPOFOL (N/A) - 12:30PM;ASA 3 as a surgical intervention.  The patient's history has been reviewed, patient examined, no change in status, stable for surgery.  I have reviewed the patient's chart and labs.  Questions were answered to the patient's satisfaction.     Katrinka Blazing Mayorga

## 2022-11-21 NOTE — Anesthesia Postprocedure Evaluation (Signed)
Anesthesia Post Note  Patient: Tracey Koch  Procedure(s) Performed: COLONOSCOPY WITH PROPOFOL POLYPECTOMY HOT HEMOSTASIS (ARGON PLASMA COAGULATION/BICAP)  Patient location during evaluation: Short Stay Anesthesia Type: General Level of consciousness: awake and alert Pain management: pain level controlled Vital Signs Assessment: post-procedure vital signs reviewed and stable Respiratory status: spontaneous breathing Cardiovascular status: blood pressure returned to baseline and stable Postop Assessment: no apparent nausea or vomiting Anesthetic complications: no   No notable events documented.   Last Vitals:  Vitals:   11/21/22 1109 11/21/22 1331  BP: (!) 175/58 (!) 112/53  Pulse: 66 72  Resp: 14 20  Temp: 36.7 C 36.5 C  SpO2: 100% 95%    Last Pain:  Vitals:   11/21/22 1331  TempSrc: Axillary  PainSc: 0-No pain                 Asya Derryberry

## 2022-11-21 NOTE — Discharge Instructions (Signed)
You are being discharged to home.  Resume your previous diet.  We are waiting for your pathology results.  Your physician has indicated that a repeat colonoscopy is not recommended due to your current age (47 years or older) for screening purposes.  Repeat CBC and iron stores in one month. Continue oral iron supplementation. Restart Eliquis tonight.

## 2022-11-21 NOTE — Op Note (Signed)
[facility] Patient Name: Tracey Koch Procedure Date: 11/21/2022 12:24 PM MRN: [id number] Date of Birth: 01-21-1946 Attending MD: Katrinka Blazing , , [id number] CSN: [id number] Age: 77 Admit Type: Outpatient Procedure:                Colonoscopy Indications:              Hematochezia, Iron deficiency anemia Providers:                Katrinka Blazing, Buel Ream. Thomasena Edis RN, RN, Burke Keels, Technician Referring MD:              Medicines:                Monitored Anesthesia Care Complications:            No immediate complications. Estimated Blood Loss:     Estimated blood loss: none. Procedure:                Pre-Anesthesia Assessment:                           - Prior to the procedure, a History and Physical                            was performed, and patient medications, allergies                            and sensitivities were reviewed. The patient's                            tolerance of previous anesthesia was reviewed.                           - The risks and benefits of the procedure and the                            sedation options and risks were discussed with the                            patient. All questions were answered and informed                            consent was obtained.                           - ASA Grade Assessment: III - A patient with severe                            systemic disease.                           After obtaining informed consent, the colonoscope                            was passed under direct vision. Throughout the  procedure, the patient's blood pressure, pulse, and                            oxygen saturations were monitored continuously. The                            [id number]-[id number] ([id number]) scope was introduced through                            the anus and advanced to the the terminal ileum.                            The colonoscopy was performed without  difficulty.                            The patient tolerated the procedure well. The                            quality of the bowel preparation was good. Scope In: 12:47:40 PM Scope Out: 1:23:41 PM Scope Withdrawal Time: 0 hours 24 minutes 34 seconds  Total Procedure Duration: 0 hours 36 minutes 1 second  Findings:      The perianal and digital rectal examinations were normal.      The terminal ileum appeared normal.      A 2 mm polyp was found in the ascending colon. The polyp was sessile.       The polyp was removed with a cold snare. Resection and retrieval were       complete.      Five medium-sized localized angiodysplastic lesions without bleeding       were found in the ascending colon. Coagulation for bleeding prevention       using argon plasma at 0.3 liters/minute and 20 watts was successful.      Multiple large-mouthed and small-mouthed diverticula were found in the       sigmoid colon and descending colon.      Non-bleeding internal hemorrhoids were found during retroflexion. The       hemorrhoids were small. Impression:               - The examined portion of the ileum was normal.                           - One 2 mm polyp in the ascending colon, removed                            with a cold snare. Resected and retrieved.                           - Five non-bleeding colonic angiodysplastic                            lesions. Treated with argon plasma coagulation                            (APC).                           -  Diverticulosis in the sigmoid colon and in the                            descending colon.                           - Non-bleeding internal hemorrhoids. Moderate Sedation:      Per Anesthesia Care Recommendation:           - Discharge patient to home (ambulatory).                           - Resume previous diet.                           - Await pathology results.                           - Repeat colonoscopy is not recommended due to                             current age (70 years or older) for screening                            purposes.                           - Repeat CBC and iron stores in one month.                           - Continue oral iron supplementation.                           - Restart Eliquis tonight. Procedure Code(s):        --- Professional ---                           [id number], 59, Colonoscopy, flexible; with control of                            bleeding, any method                           45385, Colonoscopy, flexible; with removal of                            tumor(s), polyp(s), or other lesion(s) by snare                            technique Diagnosis Code(s):        --- Professional ---                           K64.8, Other hemorrhoids                           D12.2, Benign neoplasm of ascending colon  K55.20, Angiodysplasia of colon without hemorrhage                           K92.1, Melena (includes Hematochezia)                           K57.30, Diverticulosis of large intestine without                            perforation or abscess without bleeding CPT copyright 2022 American Medical Association. All rights reserved. The codes documented in this report are preliminary and upon coder review may  be revised to meet current compliance requirements. Katrinka Blazing, MD Katrinka Blazing,  11/21/2022 1:31:37 PM This report has been signed electronically. Number of Addenda: 0

## 2022-11-22 ENCOUNTER — Telehealth (INDEPENDENT_AMBULATORY_CARE_PROVIDER_SITE_OTHER): Payer: Self-pay

## 2022-11-22 ENCOUNTER — Other Ambulatory Visit (INDEPENDENT_AMBULATORY_CARE_PROVIDER_SITE_OTHER): Payer: Self-pay

## 2022-11-22 DIAGNOSIS — K921 Melena: Secondary | ICD-10-CM

## 2022-11-22 DIAGNOSIS — D649 Anemia, unspecified: Secondary | ICD-10-CM

## 2022-11-22 DIAGNOSIS — D509 Iron deficiency anemia, unspecified: Secondary | ICD-10-CM

## 2022-11-22 LAB — SURGICAL PATHOLOGY

## 2022-11-22 NOTE — Telephone Encounter (Signed)
Mailed lab order to the patient highlighted the date she needs to have done.

## 2022-11-22 NOTE — Telephone Encounter (Signed)
I spoke with the patient she is aware she will need labs drawn in one month. Patient says she will have this drawn at Dr. Hinda Kehr office. I advised she can take the order with her and ask Dr. Neita Carp if they minded drawning and faxing to our office the order has our fax # on it.

## 2022-11-22 NOTE — Telephone Encounter (Signed)
Placed in the reminders to check and make sure she had drawn around 12/20/2022.

## 2022-11-22 NOTE — Telephone Encounter (Signed)
Please see discharge instructions pt needs CBC and iron in 1 month. Thanks!

## 2022-11-24 ENCOUNTER — Encounter (HOSPITAL_COMMUNITY): Payer: Self-pay | Admitting: Gastroenterology

## 2022-11-24 DIAGNOSIS — D631 Anemia in chronic kidney disease: Secondary | ICD-10-CM | POA: Diagnosis not present

## 2022-11-24 DIAGNOSIS — I130 Hypertensive heart and chronic kidney disease with heart failure and stage 1 through stage 4 chronic kidney disease, or unspecified chronic kidney disease: Secondary | ICD-10-CM | POA: Diagnosis not present

## 2022-11-24 DIAGNOSIS — D696 Thrombocytopenia, unspecified: Secondary | ICD-10-CM | POA: Diagnosis not present

## 2022-11-24 DIAGNOSIS — E1122 Type 2 diabetes mellitus with diabetic chronic kidney disease: Secondary | ICD-10-CM | POA: Diagnosis not present

## 2022-11-24 DIAGNOSIS — I5033 Acute on chronic diastolic (congestive) heart failure: Secondary | ICD-10-CM | POA: Diagnosis not present

## 2022-11-24 DIAGNOSIS — I350 Nonrheumatic aortic (valve) stenosis: Secondary | ICD-10-CM | POA: Diagnosis not present

## 2022-11-24 DIAGNOSIS — N189 Chronic kidney disease, unspecified: Secondary | ICD-10-CM | POA: Diagnosis not present

## 2022-11-24 DIAGNOSIS — K922 Gastrointestinal hemorrhage, unspecified: Secondary | ICD-10-CM | POA: Diagnosis not present

## 2022-11-24 DIAGNOSIS — I480 Paroxysmal atrial fibrillation: Secondary | ICD-10-CM | POA: Diagnosis not present

## 2022-11-26 DIAGNOSIS — I482 Chronic atrial fibrillation, unspecified: Secondary | ICD-10-CM | POA: Diagnosis not present

## 2022-11-26 DIAGNOSIS — J439 Emphysema, unspecified: Secondary | ICD-10-CM | POA: Diagnosis not present

## 2022-11-26 DIAGNOSIS — T39395A Adverse effect of other nonsteroidal anti-inflammatory drugs [NSAID], initial encounter: Secondary | ICD-10-CM | POA: Diagnosis not present

## 2022-11-26 DIAGNOSIS — S32010A Wedge compression fracture of first lumbar vertebra, initial encounter for closed fracture: Secondary | ICD-10-CM | POA: Diagnosis not present

## 2022-11-26 DIAGNOSIS — D62 Acute posthemorrhagic anemia: Secondary | ICD-10-CM | POA: Diagnosis not present

## 2022-11-26 DIAGNOSIS — R609 Edema, unspecified: Secondary | ICD-10-CM | POA: Diagnosis not present

## 2022-11-26 DIAGNOSIS — K922 Gastrointestinal hemorrhage, unspecified: Secondary | ICD-10-CM | POA: Diagnosis not present

## 2022-11-27 DIAGNOSIS — I130 Hypertensive heart and chronic kidney disease with heart failure and stage 1 through stage 4 chronic kidney disease, or unspecified chronic kidney disease: Secondary | ICD-10-CM | POA: Diagnosis not present

## 2022-11-27 DIAGNOSIS — D696 Thrombocytopenia, unspecified: Secondary | ICD-10-CM | POA: Diagnosis not present

## 2022-11-27 DIAGNOSIS — I480 Paroxysmal atrial fibrillation: Secondary | ICD-10-CM | POA: Diagnosis not present

## 2022-11-27 DIAGNOSIS — D631 Anemia in chronic kidney disease: Secondary | ICD-10-CM | POA: Diagnosis not present

## 2022-11-27 DIAGNOSIS — I5033 Acute on chronic diastolic (congestive) heart failure: Secondary | ICD-10-CM | POA: Diagnosis not present

## 2022-11-27 DIAGNOSIS — K922 Gastrointestinal hemorrhage, unspecified: Secondary | ICD-10-CM | POA: Diagnosis not present

## 2022-11-27 DIAGNOSIS — N189 Chronic kidney disease, unspecified: Secondary | ICD-10-CM | POA: Diagnosis not present

## 2022-11-27 DIAGNOSIS — E1122 Type 2 diabetes mellitus with diabetic chronic kidney disease: Secondary | ICD-10-CM | POA: Diagnosis not present

## 2022-11-27 NOTE — Addendum Note (Signed)
Addendum  created 11/27/22 1346 by Lorin Glass, CRNA   Intraprocedure Staff edited

## 2022-11-28 ENCOUNTER — Ambulatory Visit (INDEPENDENT_AMBULATORY_CARE_PROVIDER_SITE_OTHER): Payer: Medicare PPO | Admitting: Gastroenterology

## 2022-11-28 ENCOUNTER — Encounter: Payer: Self-pay | Admitting: Gastroenterology

## 2022-11-28 VITALS — BP 159/68 | HR 67 | Temp 97.8°F | Ht 64.0 in | Wt 163.9 lb

## 2022-11-28 DIAGNOSIS — I350 Nonrheumatic aortic (valve) stenosis: Secondary | ICD-10-CM | POA: Diagnosis not present

## 2022-11-28 DIAGNOSIS — K5900 Constipation, unspecified: Secondary | ICD-10-CM

## 2022-11-28 DIAGNOSIS — I5033 Acute on chronic diastolic (congestive) heart failure: Secondary | ICD-10-CM | POA: Diagnosis not present

## 2022-11-28 DIAGNOSIS — D509 Iron deficiency anemia, unspecified: Secondary | ICD-10-CM

## 2022-11-28 DIAGNOSIS — K5520 Angiodysplasia of colon without hemorrhage: Secondary | ICD-10-CM

## 2022-11-28 DIAGNOSIS — I130 Hypertensive heart and chronic kidney disease with heart failure and stage 1 through stage 4 chronic kidney disease, or unspecified chronic kidney disease: Secondary | ICD-10-CM | POA: Diagnosis not present

## 2022-11-28 DIAGNOSIS — N189 Chronic kidney disease, unspecified: Secondary | ICD-10-CM | POA: Diagnosis not present

## 2022-11-28 DIAGNOSIS — I480 Paroxysmal atrial fibrillation: Secondary | ICD-10-CM | POA: Diagnosis not present

## 2022-11-28 DIAGNOSIS — K921 Melena: Secondary | ICD-10-CM

## 2022-11-28 DIAGNOSIS — D696 Thrombocytopenia, unspecified: Secondary | ICD-10-CM | POA: Diagnosis not present

## 2022-11-28 DIAGNOSIS — D631 Anemia in chronic kidney disease: Secondary | ICD-10-CM | POA: Diagnosis not present

## 2022-11-28 DIAGNOSIS — E1122 Type 2 diabetes mellitus with diabetic chronic kidney disease: Secondary | ICD-10-CM | POA: Diagnosis not present

## 2022-11-28 DIAGNOSIS — K922 Gastrointestinal hemorrhage, unspecified: Secondary | ICD-10-CM | POA: Diagnosis not present

## 2022-11-28 NOTE — Patient Instructions (Addendum)
Continue taking iron once daily.   Avoid NSAIDs and aspirin containing products (Aleve, Advil, ibuprofen, BC or Goody powders).  Continue MiraLAX daily.  Follow-up in 6 months, sooner if needed  It was a pleasure to see you today. I want to create trusting relationships with patients. If you receive a survey regarding your visit,  I greatly appreciate you taking time to fill this out on paper or through your MyChart. I value your feedback.  Brooke Bonito, MSN, FNP-BC, AGACNP-BC [facility]

## 2022-11-28 NOTE — Progress Notes (Addendum)
GI Office Note    Referring Provider: Estanislado Pandy, MD Primary Care Physician:  Estanislado Pandy, MD Primary Gastroenterologist: Dolores Frame, MD  Date:  11/28/2022  ID:  Tracey Koch, DOB May 25, 1946, MRN [id number]   Chief Complaint   Chief Complaint  Patient presents with   Follow-up    Patient here today for a follow up . Patient denies any current gi issues.   History of Present Illness  Tracey Koch is a 77 y.o. female with a history of anemia, diabetes, HLD, SA node dysfunction s/p ICD, depression, aortic stenosis, rheumatoid arthritis and A-fib on Eliquis presenting today for hospital and procedural follow-up.  Recently hospitalized at [facility] 10/26/2022 and seen by our inpatient service for consultation regarding melena Positive stool.  Initial hemoglobin 9.1 with INR 1.8.  She had a repeat hemoglobin that was 8.4.  CT A/P showed large right pleural effusion with atelectasis, cardiomegaly, splenomegaly.  Patient reported taking MiraLAX regularly to help with constipation.  Denied any previous history of colonoscopy or upper endoscopy.  Noted to be on Eliquis for A-fib.  She was scheduled for EGD as outlined below.  Given her pleural effusion she underwent thoracentesis.  She was treated with PPI drip.  EGD 10/27/2022: -Normal esophagus -Congestive gastropathy s/p biopsy -Normal duodenum -Schedule outpatient colonoscopy  Colonoscopy 11/21/2022: -2 mm polyp in the ascending colon -5 nonbleeding angiodysplastic lesions in the colon treated with APC -Sigmoid and descending diverticulosis -Nonbleeding internal hemorrhoids -Obtain CBC and iron stores in 1 month -Continue iron supplementation   Today: Energy is slowly improving. Still taking iron daily. Denies melena or brbpr.   States this past weekend she had bbq at a family function and had a little vomiting but that has bene the only occurrence. Denies nausea or acid reflux. Denies dysphagia, abdominal pain.  Appetite is good. No weight loss.   Puts miralax in her coffee every morning and that keeps her regular.   Denies chest pain. Has a little bit of shortness of breath with PT but not usually.  Denies any dizziness, lightheadedness, syncope, or presyncope.  Has swelling in her legs and feet. Taking her lasix daily. Same swelling as her baseline.   Blood sugars have been good. BP has been up and down.    Current Outpatient Medications  Medication Sig Dispense Refill   albuterol (VENTOLIN HFA) 108 (90 Base) MCG/ACT inhaler Inhale 1-2 puffs into the lungs every 6 (six) hours as needed for wheezing or shortness of breath.     alendronate (FOSAMAX) 70 MG tablet Take 70 mg by mouth every Thursday.     apixaban (ELIQUIS) 5 MG TABS tablet Take 1 tablet (5 mg total) by mouth 2 (two) times daily. 60 tablet 11   Cranberry 450 MG TABS Take by mouth. 2 tablets daily     ferrous sulfate 325 (65 FE) MG tablet Take 325 mg by mouth in the morning.     fexofenadine (ALLEGRA) 180 MG tablet Take 180 mg by mouth daily.     furosemide (LASIX) 20 MG tablet Take 20 mg by mouth in the morning.     HYDROcodone-acetaminophen (NORCO) 7.5-325 MG tablet Take 1 tablet by mouth 2 (two) times daily as needed for severe pain. 30 tablet 0   losartan (COZAAR) 50 MG tablet Take 1 tablet (50 mg total) by mouth daily. 30 tablet 3   metFORMIN (GLUCOPHAGE) 500 MG tablet Take 1,000 mg by mouth 2 (two) times daily with a meal.  Multiple Vitamin (MULTIVITAMIN) tablet Take 1 tablet by mouth daily. Centrum for Women     simvastatin (ZOCOR) 20 MG tablet Take 20 mg by mouth every evening.     solifenacin (VESICARE) 5 MG tablet Take 5 mg by mouth at bedtime.     traZODone (DESYREL) 150 MG tablet Take 150 mg by mouth at bedtime.     No current facility-administered medications for this visit.    Past Medical History:  Diagnosis Date   Anemia    takes Folic Acid daily   Arthritis    takes Plaquenil daily and Methotrexate 2 times  a week   Cough    takes Allegra daily   Depression    takes Prozac at bedtime   Diabetes mellitus without complication (HCC)    takes Metformin daily   Family history of anesthesia complication    sister post op N/V   History of blood transfusion    History of UTI    Hyperlipidemia    takes Simvastatin nightly   Joint pain    Joint swelling    Pacemaker    Seasonal allergies    takes Allegra daily   Sinoatrial node dysfunction (HCC)    has ICD    Past Surgical History:  Procedure Laterality Date   ABDOMINAL HYSTERECTOMY     BIOPSY  10/27/2022   Procedure: BIOPSY;  Surgeon: Dolores Frame, MD;  [facility];  Service: Gastroenterology;;   CHOLECYSTECTOMY     COLONOSCOPY     COLONOSCOPY WITH PROPOFOL N/A 11/21/2022   Procedure: COLONOSCOPY WITH PROPOFOL;  Surgeon: Dolores Frame, MD;  Location: [facility];  Service: Gastroenterology;  Laterality: N/A;  12:30PM;ASA 3   EP IMPLANTABLE DEVICE N/A 08/06/2015   Procedure: PPM Generator Changeout;  Surgeon: Marinus Maw, MD;  Location: [facility] INVASIVE CV LAB;  Service: Cardiovascular;  Laterality: N/A;   ESOPHAGOGASTRODUODENOSCOPY (EGD) WITH PROPOFOL N/A 10/27/2022   Procedure: ESOPHAGOGASTRODUODENOSCOPY (EGD) WITH PROPOFOL;  Surgeon: Dolores Frame, MD;  [facility];  Service: Gastroenterology;  Laterality: N/A;   HOT HEMOSTASIS  11/21/2022   Procedure: HOT HEMOSTASIS (ARGON PLASMA COAGULATION/BICAP);  Surgeon: Marguerita Merles, Reuel Boom, MD;  [facility]: [facility];  Service: Gastroenterology;;   INSERT / REPLACE / REMOVE PACEMAKER     POLYPECTOMY  11/21/2022   Procedure: POLYPECTOMY;  Surgeon: Dolores Frame, MD;  [facility];  Service: Gastroenterology;;   right knee replacement     TOTAL KNEE ARTHROPLASTY Left 05/06/2013   Dr Cleophas Dunker   TOTAL KNEE ARTHROPLASTY Left 05/06/2013   Procedure: TOTAL KNEE ARTHROPLASTY;  Surgeon: [name], MD;   Location: [facility] OR;  Service: Orthopedics;  Laterality: Left;    Family History  Problem Relation Age of Onset   COPD Mother    Stroke Father    Heart attack Father    Heart failure Sister    Diabetes Sister    CAD Brother    Diabetes Brother    Multiple sclerosis Sister    CAD Brother    Diabetes Brother    Renal cancer Brother     Allergies as of 11/28/2022 - Review Complete 11/28/2022  Allergen Reaction Noted   Penicillins Shortness Of Breath, Itching, Swelling, and Rash 07/10/2011   Lisinopril Cough 11/26/2017   Macrobid [nitrofurantoin] Hives, Diarrhea, Itching, and Nausea And Vomiting 06/19/2016   Codeine Itching, Swelling, and Rash 07/10/2011   Levaquin [levofloxacin in d5w] Itching, Swelling, and Other (See Comments) 03/13/2014  Sulfa drugs cross reactors Itching, Swelling, and Rash 07/10/2011    Social History   Socioeconomic History   Marital status: Married    Spouse name: Not on file   Number of children: Not on file   Years of education: Not on file   Highest education level: Not on file  Occupational History   Not on file  Tobacco Use   Smoking status: Former    Packs/day: 1.00    Years: 52.00    Additional pack years: 0.00    Total pack years: 52.00    Types: Cigarettes    Start date: 03/01/1962    Quit date: 11/28/2020    Years since quitting: 2.0   Smokeless tobacco: Never  Vaping Use   Vaping Use: Never used  Substance and Sexual Activity   Alcohol use: No    Alcohol/week: 0.0 standard drinks of alcohol   Drug use: No   Sexual activity: Not Currently    Birth control/protection: Surgical  Other Topics Concern   Not on file  Social History Narrative   Not on file   Social Determinants of Health   Financial Resource Strain: Not on file  Food Insecurity: No Food Insecurity (10/25/2022)   Hunger Vital Sign    Worried About Running Out of Food in the Last Year: Never true    Ran Out of Food in the Last Year: Never true  Transportation Needs:  Unmet Transportation Needs (10/25/2022)   PRAPARE - Administrator, Civil Service (Medical): Yes    Lack of Transportation (Non-Medical): Yes  Physical Activity: Not on file  Stress: Not on file  Social Connections: Not on file     Review of Systems   Gen: Denies fever, chills, anorexia. Denies fatigue, weakness, weight loss.  CV: Denies chest pain, palpitations, syncope, peripheral edema, and claudication. Resp: Denies dyspnea at rest, cough, wheezing, coughing up blood, and pleurisy. GI: See HPI Derm: Denies rash, itching, dry skin Psych: Denies depression, anxiety, memory loss, confusion. No homicidal or suicidal ideation.  Heme: Denies bruising, bleeding, and enlarged lymph nodes.   Physical Exam   BP (!) 159/68 (BP Location: Left Arm, Patient Position: Sitting, Cuff Size: Large)   Pulse 67   Temp 97.8 F (36.6 C) (Temporal)   Ht 5\' 4"  (1.626 m)   Wt 163 lb 14.4 oz (74.3 kg)   BMI 28.13 kg/m   General:   Alert and oriented. No distress noted. Pleasant and cooperative.  Head:  Normocephalic and atraumatic. Eyes:  Conjuctiva clear without scleral icterus. Lungs:  Clear to auscultation bilaterally. No wheezes, rales, or rhonchi. No distress.  Heart:  S1, S2. Systolic murmur Abdomen:  +BS, soft, non-tender and non-distended. No rebound or guarding. No HSM or masses noted. Rectal: deferred Msk:  Symmetrical without gross deformities. Normal posture. Extremities:  Without edema. Neurologic:  Alert and  oriented x4 Psych:  Alert and cooperative. Normal mood and affect.   Assessment  Tracey Koch is a 77 y.o. female with a history of anemia, diabetes, HLD, SA node dysfunction s/p ICD, depression, aortic stenosis, rheumatoid arthritis and A-fib on Eliquis presenting today for hospital and procedural follow-up.  Anemia, colonic AVMs: Recent hospitalization with anemia, melena, and heme positive stool.  Underwent EGD inpatient that was negative.  Recent  outpatient colonoscopy with evidence of 5 colonic AVMs s/p APC therapy.  Maintained on oral iron currently.  Will assess CBC and iron panel in 1 month with her PCP and will request the  results to be faxed to [place].  Currently without any melena, BRBPR, fatigue, lightheadedness, chest pain, or shortness of breath, or dizziness.   Constipation: Well-controlled with MiraLAX 1 capful daily in her coffee.  PLAN   Continue iron 325 mg  once daily CBC and iron panel in 1 month with Dr. Neita Carp. Request results be faxed to [place].  Avoid NSAIDs Continue MiraLAX 1 capful daily Follow-up in 6 months    Brooke Bonito, MSN, FNP-BC, AGACNP-BC [facility]  I have reviewed the note and agree with the APP's assessment as described in this progress note  Katrinka Blazing, MD Gastroenterology and Hepatology [facility]

## 2022-12-03 DIAGNOSIS — I350 Nonrheumatic aortic (valve) stenosis: Secondary | ICD-10-CM | POA: Diagnosis not present

## 2022-12-03 DIAGNOSIS — K922 Gastrointestinal hemorrhage, unspecified: Secondary | ICD-10-CM | POA: Diagnosis not present

## 2022-12-03 DIAGNOSIS — I480 Paroxysmal atrial fibrillation: Secondary | ICD-10-CM | POA: Diagnosis not present

## 2022-12-03 DIAGNOSIS — D696 Thrombocytopenia, unspecified: Secondary | ICD-10-CM | POA: Diagnosis not present

## 2022-12-03 DIAGNOSIS — E1122 Type 2 diabetes mellitus with diabetic chronic kidney disease: Secondary | ICD-10-CM | POA: Diagnosis not present

## 2022-12-03 DIAGNOSIS — N189 Chronic kidney disease, unspecified: Secondary | ICD-10-CM | POA: Diagnosis not present

## 2022-12-03 DIAGNOSIS — I130 Hypertensive heart and chronic kidney disease with heart failure and stage 1 through stage 4 chronic kidney disease, or unspecified chronic kidney disease: Secondary | ICD-10-CM | POA: Diagnosis not present

## 2022-12-03 DIAGNOSIS — D631 Anemia in chronic kidney disease: Secondary | ICD-10-CM | POA: Diagnosis not present

## 2022-12-03 DIAGNOSIS — I5033 Acute on chronic diastolic (congestive) heart failure: Secondary | ICD-10-CM | POA: Diagnosis not present

## 2022-12-06 DIAGNOSIS — K922 Gastrointestinal hemorrhage, unspecified: Secondary | ICD-10-CM | POA: Diagnosis not present

## 2022-12-06 DIAGNOSIS — E1122 Type 2 diabetes mellitus with diabetic chronic kidney disease: Secondary | ICD-10-CM | POA: Diagnosis not present

## 2022-12-06 DIAGNOSIS — D631 Anemia in chronic kidney disease: Secondary | ICD-10-CM | POA: Diagnosis not present

## 2022-12-06 DIAGNOSIS — I130 Hypertensive heart and chronic kidney disease with heart failure and stage 1 through stage 4 chronic kidney disease, or unspecified chronic kidney disease: Secondary | ICD-10-CM | POA: Diagnosis not present

## 2022-12-06 DIAGNOSIS — D696 Thrombocytopenia, unspecified: Secondary | ICD-10-CM | POA: Diagnosis not present

## 2022-12-06 DIAGNOSIS — I480 Paroxysmal atrial fibrillation: Secondary | ICD-10-CM | POA: Diagnosis not present

## 2022-12-06 DIAGNOSIS — N189 Chronic kidney disease, unspecified: Secondary | ICD-10-CM | POA: Diagnosis not present

## 2022-12-06 DIAGNOSIS — I5033 Acute on chronic diastolic (congestive) heart failure: Secondary | ICD-10-CM | POA: Diagnosis not present

## 2022-12-06 DIAGNOSIS — I350 Nonrheumatic aortic (valve) stenosis: Secondary | ICD-10-CM | POA: Diagnosis not present

## 2022-12-07 ENCOUNTER — Ambulatory Visit (INDEPENDENT_AMBULATORY_CARE_PROVIDER_SITE_OTHER): Payer: Medicare PPO

## 2022-12-07 DIAGNOSIS — I495 Sick sinus syndrome: Secondary | ICD-10-CM

## 2022-12-07 LAB — CUP PACEART REMOTE DEVICE CHECK
Battery Impedance: 602 Ohm
Battery Remaining Longevity: 101 mo
Battery Voltage: 2.79 V
Brady Statistic AP VP Percent: 0 %
Brady Statistic AP VS Percent: 6 %
Brady Statistic AS VP Percent: 0 %
Brady Statistic AS VS Percent: 94 %
Date Time Interrogation Session: 20240509091842
Implantable Lead Connection Status: 753985
Implantable Lead Connection Status: 753985
Implantable Lead Implant Date: 20061110
Implantable Lead Implant Date: 20061110
Implantable Lead Location: 753859
Implantable Lead Location: 753860
Implantable Lead Model: 5076
Implantable Lead Model: 5076
Implantable Pulse Generator Implant Date: 20170106
Lead Channel Impedance Value: 465 Ohm
Lead Channel Impedance Value: 616 Ohm
Lead Channel Setting Pacing Amplitude: 1.5 V
Lead Channel Setting Pacing Amplitude: 2 V
Lead Channel Setting Pacing Pulse Width: 0.4 ms
Lead Channel Setting Sensing Sensitivity: 5.6 mV
Zone Setting Status: 755011
Zone Setting Status: 755011

## 2022-12-12 DIAGNOSIS — D696 Thrombocytopenia, unspecified: Secondary | ICD-10-CM | POA: Diagnosis not present

## 2022-12-12 DIAGNOSIS — I480 Paroxysmal atrial fibrillation: Secondary | ICD-10-CM | POA: Diagnosis not present

## 2022-12-12 DIAGNOSIS — I130 Hypertensive heart and chronic kidney disease with heart failure and stage 1 through stage 4 chronic kidney disease, or unspecified chronic kidney disease: Secondary | ICD-10-CM | POA: Diagnosis not present

## 2022-12-12 DIAGNOSIS — I350 Nonrheumatic aortic (valve) stenosis: Secondary | ICD-10-CM | POA: Diagnosis not present

## 2022-12-12 DIAGNOSIS — K922 Gastrointestinal hemorrhage, unspecified: Secondary | ICD-10-CM | POA: Diagnosis not present

## 2022-12-12 DIAGNOSIS — I5033 Acute on chronic diastolic (congestive) heart failure: Secondary | ICD-10-CM | POA: Diagnosis not present

## 2022-12-12 DIAGNOSIS — D631 Anemia in chronic kidney disease: Secondary | ICD-10-CM | POA: Diagnosis not present

## 2022-12-12 DIAGNOSIS — N189 Chronic kidney disease, unspecified: Secondary | ICD-10-CM | POA: Diagnosis not present

## 2022-12-12 DIAGNOSIS — E1122 Type 2 diabetes mellitus with diabetic chronic kidney disease: Secondary | ICD-10-CM | POA: Diagnosis not present

## 2022-12-18 DIAGNOSIS — I350 Nonrheumatic aortic (valve) stenosis: Secondary | ICD-10-CM | POA: Diagnosis not present

## 2022-12-18 DIAGNOSIS — N189 Chronic kidney disease, unspecified: Secondary | ICD-10-CM | POA: Diagnosis not present

## 2022-12-18 DIAGNOSIS — D631 Anemia in chronic kidney disease: Secondary | ICD-10-CM | POA: Diagnosis not present

## 2022-12-18 DIAGNOSIS — E1122 Type 2 diabetes mellitus with diabetic chronic kidney disease: Secondary | ICD-10-CM | POA: Diagnosis not present

## 2022-12-18 DIAGNOSIS — I5033 Acute on chronic diastolic (congestive) heart failure: Secondary | ICD-10-CM | POA: Diagnosis not present

## 2022-12-18 DIAGNOSIS — I130 Hypertensive heart and chronic kidney disease with heart failure and stage 1 through stage 4 chronic kidney disease, or unspecified chronic kidney disease: Secondary | ICD-10-CM | POA: Diagnosis not present

## 2022-12-18 DIAGNOSIS — K922 Gastrointestinal hemorrhage, unspecified: Secondary | ICD-10-CM | POA: Diagnosis not present

## 2022-12-18 DIAGNOSIS — D696 Thrombocytopenia, unspecified: Secondary | ICD-10-CM | POA: Diagnosis not present

## 2022-12-18 DIAGNOSIS — I480 Paroxysmal atrial fibrillation: Secondary | ICD-10-CM | POA: Diagnosis not present

## 2022-12-20 DIAGNOSIS — N184 Chronic kidney disease, stage 4 (severe): Secondary | ICD-10-CM | POA: Diagnosis not present

## 2022-12-20 DIAGNOSIS — R5383 Other fatigue: Secondary | ICD-10-CM | POA: Diagnosis not present

## 2022-12-20 DIAGNOSIS — D649 Anemia, unspecified: Secondary | ICD-10-CM | POA: Diagnosis not present

## 2022-12-26 NOTE — Progress Notes (Signed)
Remote pacemaker transmission.   

## 2022-12-28 DIAGNOSIS — I480 Paroxysmal atrial fibrillation: Secondary | ICD-10-CM | POA: Diagnosis not present

## 2022-12-28 DIAGNOSIS — D696 Thrombocytopenia, unspecified: Secondary | ICD-10-CM | POA: Diagnosis not present

## 2022-12-28 DIAGNOSIS — I350 Nonrheumatic aortic (valve) stenosis: Secondary | ICD-10-CM | POA: Diagnosis not present

## 2022-12-28 DIAGNOSIS — I5033 Acute on chronic diastolic (congestive) heart failure: Secondary | ICD-10-CM | POA: Diagnosis not present

## 2022-12-28 DIAGNOSIS — I130 Hypertensive heart and chronic kidney disease with heart failure and stage 1 through stage 4 chronic kidney disease, or unspecified chronic kidney disease: Secondary | ICD-10-CM | POA: Diagnosis not present

## 2022-12-28 DIAGNOSIS — N189 Chronic kidney disease, unspecified: Secondary | ICD-10-CM | POA: Diagnosis not present

## 2022-12-28 DIAGNOSIS — E1122 Type 2 diabetes mellitus with diabetic chronic kidney disease: Secondary | ICD-10-CM | POA: Diagnosis not present

## 2022-12-28 DIAGNOSIS — K922 Gastrointestinal hemorrhage, unspecified: Secondary | ICD-10-CM | POA: Diagnosis not present

## 2022-12-28 DIAGNOSIS — D631 Anemia in chronic kidney disease: Secondary | ICD-10-CM | POA: Diagnosis not present

## 2023-01-18 DIAGNOSIS — N189 Chronic kidney disease, unspecified: Secondary | ICD-10-CM | POA: Diagnosis not present

## 2023-01-18 DIAGNOSIS — E1122 Type 2 diabetes mellitus with diabetic chronic kidney disease: Secondary | ICD-10-CM | POA: Diagnosis not present

## 2023-01-18 DIAGNOSIS — E7849 Other hyperlipidemia: Secondary | ICD-10-CM | POA: Diagnosis not present

## 2023-01-24 DIAGNOSIS — E782 Mixed hyperlipidemia: Secondary | ICD-10-CM | POA: Diagnosis not present

## 2023-01-24 DIAGNOSIS — N3946 Mixed incontinence: Secondary | ICD-10-CM | POA: Diagnosis not present

## 2023-01-24 DIAGNOSIS — F09 Unspecified mental disorder due to known physiological condition: Secondary | ICD-10-CM | POA: Diagnosis not present

## 2023-01-24 DIAGNOSIS — E7849 Other hyperlipidemia: Secondary | ICD-10-CM | POA: Diagnosis not present

## 2023-01-24 DIAGNOSIS — I482 Chronic atrial fibrillation, unspecified: Secondary | ICD-10-CM | POA: Diagnosis not present

## 2023-01-24 DIAGNOSIS — M069 Rheumatoid arthritis, unspecified: Secondary | ICD-10-CM | POA: Diagnosis not present

## 2023-01-24 DIAGNOSIS — F5101 Primary insomnia: Secondary | ICD-10-CM | POA: Diagnosis not present

## 2023-01-24 DIAGNOSIS — R296 Repeated falls: Secondary | ICD-10-CM | POA: Diagnosis not present

## 2023-01-24 DIAGNOSIS — I10 Essential (primary) hypertension: Secondary | ICD-10-CM | POA: Diagnosis not present

## 2023-01-24 DIAGNOSIS — E1165 Type 2 diabetes mellitus with hyperglycemia: Secondary | ICD-10-CM | POA: Diagnosis not present

## 2023-02-12 ENCOUNTER — Telehealth (INDEPENDENT_AMBULATORY_CARE_PROVIDER_SITE_OTHER): Payer: Self-pay | Admitting: Gastroenterology

## 2023-02-12 NOTE — Telephone Encounter (Signed)
 Patient made aware of all.  

## 2023-02-12 NOTE — Telephone Encounter (Signed)
Received the results of the most recent blood workup performed on 12/20/2022 showed a WBC of 3.3, hemoglobin 9.7, platelets of 129, improved iron stores with iron saturation of 22%, iron of 83 and ferritin of 40.  Please let the patient know her iron stores are improving and her hemoglobin is better compared to prior but still has not normalized.  We should repeat a CBC and iron stores in 3 months.  She has an appointment with Toni Amend in 3 months.  Also, please let her know that she should follow-up with her PCP regarding her cell counts as although counts have been low, which may need to have further monitoring and workup.

## 2023-02-19 DIAGNOSIS — R030 Elevated blood-pressure reading, without diagnosis of hypertension: Secondary | ICD-10-CM | POA: Diagnosis not present

## 2023-02-19 DIAGNOSIS — Z20828 Contact with and (suspected) exposure to other viral communicable diseases: Secondary | ICD-10-CM | POA: Diagnosis not present

## 2023-02-19 DIAGNOSIS — R059 Cough, unspecified: Secondary | ICD-10-CM | POA: Diagnosis not present

## 2023-02-21 ENCOUNTER — Encounter (INDEPENDENT_AMBULATORY_CARE_PROVIDER_SITE_OTHER): Payer: Self-pay

## 2023-03-08 ENCOUNTER — Ambulatory Visit (INDEPENDENT_AMBULATORY_CARE_PROVIDER_SITE_OTHER): Payer: Medicare PPO

## 2023-03-08 DIAGNOSIS — I495 Sick sinus syndrome: Secondary | ICD-10-CM | POA: Diagnosis not present

## 2023-04-10 DIAGNOSIS — R030 Elevated blood-pressure reading, without diagnosis of hypertension: Secondary | ICD-10-CM | POA: Diagnosis not present

## 2023-04-10 DIAGNOSIS — N3000 Acute cystitis without hematuria: Secondary | ICD-10-CM | POA: Diagnosis not present

## 2023-04-24 NOTE — Progress Notes (Signed)
Remote pacemaker transmission.   

## 2023-05-16 DIAGNOSIS — F17210 Nicotine dependence, cigarettes, uncomplicated: Secondary | ICD-10-CM | POA: Diagnosis not present

## 2023-05-16 DIAGNOSIS — E7849 Other hyperlipidemia: Secondary | ICD-10-CM | POA: Diagnosis not present

## 2023-05-16 DIAGNOSIS — N184 Chronic kidney disease, stage 4 (severe): Secondary | ICD-10-CM | POA: Diagnosis not present

## 2023-05-16 DIAGNOSIS — E1122 Type 2 diabetes mellitus with diabetic chronic kidney disease: Secondary | ICD-10-CM | POA: Diagnosis not present

## 2023-05-23 DIAGNOSIS — N3946 Mixed incontinence: Secondary | ICD-10-CM | POA: Diagnosis not present

## 2023-05-23 DIAGNOSIS — N184 Chronic kidney disease, stage 4 (severe): Secondary | ICD-10-CM | POA: Diagnosis not present

## 2023-05-23 DIAGNOSIS — M069 Rheumatoid arthritis, unspecified: Secondary | ICD-10-CM | POA: Diagnosis not present

## 2023-05-23 DIAGNOSIS — R2689 Other abnormalities of gait and mobility: Secondary | ICD-10-CM | POA: Diagnosis not present

## 2023-05-23 DIAGNOSIS — I10 Essential (primary) hypertension: Secondary | ICD-10-CM | POA: Diagnosis not present

## 2023-05-23 DIAGNOSIS — E1165 Type 2 diabetes mellitus with hyperglycemia: Secondary | ICD-10-CM | POA: Diagnosis not present

## 2023-05-23 DIAGNOSIS — I482 Chronic atrial fibrillation, unspecified: Secondary | ICD-10-CM | POA: Diagnosis not present

## 2023-05-23 DIAGNOSIS — F09 Unspecified mental disorder due to known physiological condition: Secondary | ICD-10-CM | POA: Diagnosis not present

## 2023-05-23 DIAGNOSIS — E7849 Other hyperlipidemia: Secondary | ICD-10-CM | POA: Diagnosis not present

## 2023-05-28 DIAGNOSIS — R300 Dysuria: Secondary | ICD-10-CM | POA: Diagnosis not present

## 2023-05-30 ENCOUNTER — Ambulatory Visit (INDEPENDENT_AMBULATORY_CARE_PROVIDER_SITE_OTHER): Payer: Medicare PPO | Admitting: Gastroenterology

## 2023-05-30 ENCOUNTER — Encounter: Payer: Self-pay | Admitting: Gastroenterology

## 2023-05-30 VITALS — BP 134/65 | HR 75 | Temp 98.6°F | Ht 64.0 in | Wt 165.8 lb

## 2023-05-30 DIAGNOSIS — K5520 Angiodysplasia of colon without hemorrhage: Secondary | ICD-10-CM | POA: Diagnosis not present

## 2023-05-30 DIAGNOSIS — D509 Iron deficiency anemia, unspecified: Secondary | ICD-10-CM

## 2023-05-30 DIAGNOSIS — K5900 Constipation, unspecified: Secondary | ICD-10-CM | POA: Diagnosis not present

## 2023-05-30 NOTE — Patient Instructions (Signed)
Continue taking your iron once daily.  Avoid all NSAIDs (ibuprofen, Aleve, Advil, Motrin) and Goody powders  We will request your recent labs from your primary care provider.  Please continue taking MiraLAX daily in your coffee and I would like for you to start taking a daily stool softener (you can use Colace or Senokot, you can get generic/store brand if you would like.  Follow-up in 6 months, sooner if needed!  I be of a wonderful Thanksgiving and Christmas!  It was a pleasure to see you today. I want to create trusting relationships with patients. If you receive a survey regarding your visit,  I greatly appreciate you taking time to fill this out on paper or through your MyChart. I value your feedback.  Brooke Bonito, MSN, FNP-BC, AGACNP-BC [facility]

## 2023-06-01 ENCOUNTER — Telehealth: Payer: Self-pay | Admitting: Gastroenterology

## 2023-06-01 NOTE — Telephone Encounter (Signed)
Received labs from primary care office.  No recent hemoglobin checked.  Only checked A1c, lipid panel, and CMP on 05/16/23  Sodium 130, chloride 111, glucose 160, BUN 36, albumin 3.4, creatinine 2.1, EGFR 23.6.  Normal lipid panel.  A1c 5.7  Brooke Bonito, MSN, APRN, FNP-BC, AGACNP-BC [facility] [facility]

## 2023-06-01 NOTE — Telephone Encounter (Signed)
Please have patient get a repeat CBC, please let her daughter know as well. This was not checked by her primary care provider.   If her hemoglobin remains low then we may need to do a capsule study to evaluate further for GI cause.   Brooke Bonito, MSN, APRN, FNP-BC, AGACNP-BC [facility] at [facility]

## 2023-06-07 ENCOUNTER — Ambulatory Visit (INDEPENDENT_AMBULATORY_CARE_PROVIDER_SITE_OTHER): Payer: Medicare PPO

## 2023-06-07 DIAGNOSIS — I495 Sick sinus syndrome: Secondary | ICD-10-CM

## 2023-06-08 LAB — CUP PACEART REMOTE DEVICE CHECK
Battery Impedance: 678 Ohm
Battery Remaining Longevity: 94 mo
Battery Voltage: 2.79 V
Brady Statistic AP VP Percent: 0 %
Brady Statistic AP VS Percent: 13 %
Brady Statistic AS VP Percent: 0 %
Brady Statistic AS VS Percent: 87 %
Date Time Interrogation Session: 20241107102744
Implantable Lead Connection Status: 753985
Implantable Lead Connection Status: 753985
Implantable Lead Implant Date: 20061110
Implantable Lead Implant Date: 20061110
Implantable Lead Location: 753859
Implantable Lead Location: 753860
Implantable Lead Model: 5076
Implantable Lead Model: 5076
Implantable Pulse Generator Implant Date: 20170106
Lead Channel Impedance Value: 459 Ohm
Lead Channel Impedance Value: 612 Ohm
Lead Channel Setting Pacing Amplitude: 1.5 V
Lead Channel Setting Pacing Amplitude: 2 V
Lead Channel Setting Pacing Pulse Width: 0.4 ms
Lead Channel Setting Sensing Sensitivity: 5.6 mV
Zone Setting Status: 755011
Zone Setting Status: 755011

## 2023-06-08 NOTE — Telephone Encounter (Signed)
Tried to call pt's daughter several times no answer no voicemail not set up.

## 2023-06-20 NOTE — Progress Notes (Signed)
Remote pacemaker transmission.   

## 2023-06-26 ENCOUNTER — Other Ambulatory Visit: Payer: Self-pay | Admitting: *Deleted

## 2023-06-26 DIAGNOSIS — D509 Iron deficiency anemia, unspecified: Secondary | ICD-10-CM

## 2023-06-26 NOTE — Telephone Encounter (Signed)
Spoke to pt and daughter Risk analyst) informed that she needed labs. They voiced understanding. Daughter stated she will try and take her one day next week. Labs entered into Epic.

## 2023-08-10 DIAGNOSIS — R300 Dysuria: Secondary | ICD-10-CM | POA: Diagnosis not present

## 2023-09-06 ENCOUNTER — Ambulatory Visit (INDEPENDENT_AMBULATORY_CARE_PROVIDER_SITE_OTHER): Payer: Medicare HMO

## 2023-09-06 DIAGNOSIS — I495 Sick sinus syndrome: Secondary | ICD-10-CM

## 2023-09-12 LAB — CUP PACEART REMOTE DEVICE CHECK
Battery Impedance: 729 Ohm
Battery Remaining Longevity: 91 mo
Battery Voltage: 2.8 V
Brady Statistic AP VP Percent: 0 %
Brady Statistic AP VS Percent: 13 %
Brady Statistic AS VP Percent: 0 %
Brady Statistic AS VS Percent: 87 %
Date Time Interrogation Session: 20250207104624
Implantable Lead Connection Status: 753985
Implantable Lead Connection Status: 753985
Implantable Lead Implant Date: 20061110
Implantable Lead Implant Date: 20061110
Implantable Lead Location: 753859
Implantable Lead Location: 753860
Implantable Lead Model: 5076
Implantable Lead Model: 5076
Implantable Pulse Generator Implant Date: 20170106
Lead Channel Impedance Value: 471 Ohm
Lead Channel Impedance Value: 610 Ohm
Lead Channel Setting Pacing Amplitude: 1.5 V
Lead Channel Setting Pacing Amplitude: 2 V
Lead Channel Setting Pacing Pulse Width: 0.4 ms
Lead Channel Setting Sensing Sensitivity: 5.6 mV
Zone Setting Status: 755011
Zone Setting Status: 755011

## 2023-09-14 DIAGNOSIS — R9089 Other abnormal findings on diagnostic imaging of central nervous system: Secondary | ICD-10-CM | POA: Diagnosis not present

## 2023-09-14 DIAGNOSIS — J449 Chronic obstructive pulmonary disease, unspecified: Secondary | ICD-10-CM | POA: Diagnosis not present

## 2023-09-14 DIAGNOSIS — S0003XA Contusion of scalp, initial encounter: Secondary | ICD-10-CM | POA: Diagnosis not present

## 2023-09-14 DIAGNOSIS — J90 Pleural effusion, not elsewhere classified: Secondary | ICD-10-CM | POA: Diagnosis not present

## 2023-09-14 DIAGNOSIS — R079 Chest pain, unspecified: Secondary | ICD-10-CM | POA: Diagnosis not present

## 2023-09-14 DIAGNOSIS — S0990XA Unspecified injury of head, initial encounter: Secondary | ICD-10-CM | POA: Diagnosis not present

## 2023-09-14 DIAGNOSIS — S0101XA Laceration without foreign body of scalp, initial encounter: Secondary | ICD-10-CM | POA: Diagnosis not present

## 2023-09-14 DIAGNOSIS — I509 Heart failure, unspecified: Secondary | ICD-10-CM | POA: Diagnosis not present

## 2023-09-14 DIAGNOSIS — F17210 Nicotine dependence, cigarettes, uncomplicated: Secondary | ICD-10-CM | POA: Diagnosis not present

## 2023-09-14 DIAGNOSIS — E7800 Pure hypercholesterolemia, unspecified: Secondary | ICD-10-CM | POA: Diagnosis not present

## 2023-09-14 DIAGNOSIS — R918 Other nonspecific abnormal finding of lung field: Secondary | ICD-10-CM | POA: Diagnosis not present

## 2023-09-14 DIAGNOSIS — E1142 Type 2 diabetes mellitus with diabetic polyneuropathy: Secondary | ICD-10-CM | POA: Diagnosis not present

## 2023-09-14 DIAGNOSIS — Z23 Encounter for immunization: Secondary | ICD-10-CM | POA: Diagnosis not present

## 2023-09-14 DIAGNOSIS — E860 Dehydration: Secondary | ICD-10-CM | POA: Diagnosis not present

## 2023-09-14 DIAGNOSIS — J168 Pneumonia due to other specified infectious organisms: Secondary | ICD-10-CM | POA: Diagnosis not present

## 2023-09-14 DIAGNOSIS — Z950 Presence of cardiac pacemaker: Secondary | ICD-10-CM | POA: Diagnosis not present

## 2023-09-14 DIAGNOSIS — J189 Pneumonia, unspecified organism: Secondary | ICD-10-CM | POA: Diagnosis not present

## 2023-09-14 DIAGNOSIS — E119 Type 2 diabetes mellitus without complications: Secondary | ICD-10-CM | POA: Diagnosis not present

## 2023-09-14 DIAGNOSIS — I10 Essential (primary) hypertension: Secondary | ICD-10-CM | POA: Diagnosis not present

## 2023-09-14 DIAGNOSIS — S06320A Contusion and laceration of left cerebrum without loss of consciousness, initial encounter: Secondary | ICD-10-CM | POA: Diagnosis not present

## 2023-09-14 NOTE — ED Provider Notes (Addendum)
 Emergency Department Provider Note    ED Clinical Impression   Final diagnoses:  Fall, initial encounter  Injury of head, initial encounter  Laceration of scalp, initial encounter  Primary hypertension  Dehydration  Pneumonia of right lower lobe due to infectious organism (Primary)    ED Assessment/Plan    History   Chief Complaint  Patient presents with  . Fall  . Head Injury with Unknown LOC  . Laceration   HPI  1915 hrs. Based 78 year old female complex medical history including cardiac pacemaker chest pain cardiac cath that was negative in the past with very minor congestive heart failure COPD chronic pain she fell today without apparent reason.  Does not appear to be a syncopal episode exam awake alert she has slight systolic and diastolic hypertension cardiopulmonary general neuroexam negative. Injury includes a occipital laceration. Will go ahead and clean off the hair and plan staple going to this area otherwise CAT scan pending blood work consider for discharge  Past Medical History:  Diagnosis Date  . Anemia, unspecified   . BMI 31.0-31.9,adult   . BMI 32.0-32.9,adult   . Cardiac pacemaker in situ   . Chest pain 04/2005   cath no stenoses, EF 65-70%, low normal LVF; prolonged sinus pauses with dual chamber pacemaker 11/06  . Chronic airflow obstruction (CMS-HCC)   . Chronic pain disorder   . COPD exacerbation (CMS-HCC)   . Generalized colicky abdominal pain   . Hypertension 2000  . Hypoalbuminemia   . Peripheral neuropathy 1999  . Polyarthritis 2008   aspirate neg for crystals, presumed seroneg RA 2009  . Pure hypercholesterolemia   . Recurrent UTI    with normal renal ultrasound and bilat retrograde pyelograms 04/2011  . Sepsis (CMS-HCC)    urosepsis, hosp 8/14-8/18/15 blood cult + E. Coli  . Tobacco use disorder   . Type 2 diabetes mellitus without complications (CMS-HCC) 1998  . Urinary incontinence in female     Past Surgical History:   Procedure Laterality Date  . APPENDECTOMY  01/1986  . CARDIAC CATHETERIZATION  04/2005   EF 65-70%, low normal LVF  . CARDIAC PACEMAKER PLACEMENT  05/2005  . CYSTOSCOPY  04/2011   with heavily trabeculated bladder, random bx chronic inflammation per urology  . CYSTOSCOPY  05/1986   chronic microhematuria neg cysto  . CYSTOSCOPY  03/21/2018   Diffuse erythema, erythematous patch in posterior bladder wall (either CIS or chronic infection), bullous mucosal changes at the bladder neck consistent with cystitis cystica  . LAPAROSCOPIC CHOLECYSTECTOMY    . TOTAL ABDOMINAL HYSTERECTOMY W/ BILATERAL SALPINGOOPHORECTOMY  01/1986  . TOTAL KNEE ARTHROPLASTY Right 10/21  . TOTAL KNEE ARTHROPLASTY Left 05/06/2013    Family History  Problem Relation Age of Onset  . Emphysema Mother   . Aneurysm Father     Social History   Socioeconomic History  . Marital status: Married  Tobacco Use  . Smoking status: Every Day    Current packs/day: 1.00    Average packs/day: 1 pack/day for 40.0 years (40.0 ttl pk-yrs)    Types: Cigarettes  . Smokeless tobacco: Never  Substance and Sexual Activity  . Alcohol  use: No  . Drug use: No   Social Drivers of Health   Food Insecurity: No Food Insecurity (10/25/2022)   Received from [facility]   Hunger Vital Sign   . Worried About Programme researcher, broadcasting/film/video in the Last Year: Never true   . Ran Out of Food in the Last Year: Never true  Transportation  Needs: Unmet Transportation Needs (10/25/2022)   Received from [facility] - Transportation   . Lack of Transportation (Medical): Yes   . Lack of Transportation (Non-Medical): Yes    Review of Systems  Respiratory:  Negative for chest tightness and shortness of breath.   Gastrointestinal:  Negative for nausea.  Skin:  Positive for wound.  Neurological:  Positive for headaches.  All other systems reviewed and are negative.   Physical Exam   BP 171/122   Pulse 77   Temp 36.2 C (97.2 F)  (Temporal)   Resp 22   Ht 167.6 cm (5' 6)   SpO2 98%   BMI 26.15 kg/m   Physical Exam  Vital signs have been reviewed. Patient is well-appearing w/o respiratory distress shock or major trauma. HEENT is atraumatic.  Neck shows unimpaired range of motion. Chest No increased work of breathing or audible wheezing. Cardiac Good perfusion throughout. Abdomen is not distended.  Sipper laceration not yet visualized Extremities are without significant trauma. Skin no visualized rash. Neuro exam is grossly non-focal. Psych exam shows normal mood and behavior.  ED Course    CAT scan pending treat and release   Medical Decision Making   2015 hrs. CT imaging is pending at this time patient has significant hypertension some elevated BUN/creatinine compared to last measured 5 years ago will give IV fluids recommend referral back to primary care   2030 hrs. X-ray reviewed patient has right lower lobe pneumonia we will review criteria for pneumonia admission Pneumonia severity score is 88 borderline for admission will discuss with patient and family  I have updated clinical scenario with patient and family and scoring for pneumonia patient family has decided that they wish to go home have recommended recheck tomorrow for reevaluation of pneumonia consideration for failure of outpatient treatment I think it is reasonable to let her go home although we would consider for admission if she does not do well with home management    [place], Norleen Agent, MD 09/14/23 1916    [place] Norleen Agent, MD 09/14/23 2015    [place] Norleen Agent, MD 09/14/23 2016    [place] Norleen Agent, MD 09/14/23 2034

## 2023-09-15 DIAGNOSIS — I482 Chronic atrial fibrillation, unspecified: Secondary | ICD-10-CM | POA: Diagnosis not present

## 2023-09-15 DIAGNOSIS — Z6826 Body mass index (BMI) 26.0-26.9, adult: Secondary | ICD-10-CM | POA: Diagnosis not present

## 2023-09-15 DIAGNOSIS — S0101XA Laceration without foreign body of scalp, initial encounter: Secondary | ICD-10-CM | POA: Diagnosis not present

## 2023-09-17 ENCOUNTER — Encounter: Payer: Self-pay | Admitting: Internal Medicine

## 2023-09-17 DIAGNOSIS — N184 Chronic kidney disease, stage 4 (severe): Secondary | ICD-10-CM | POA: Diagnosis not present

## 2023-09-17 DIAGNOSIS — I10 Essential (primary) hypertension: Secondary | ICD-10-CM | POA: Diagnosis not present

## 2023-09-17 DIAGNOSIS — E7849 Other hyperlipidemia: Secondary | ICD-10-CM | POA: Diagnosis not present

## 2023-09-17 DIAGNOSIS — R5383 Other fatigue: Secondary | ICD-10-CM | POA: Diagnosis not present

## 2023-09-17 DIAGNOSIS — E1122 Type 2 diabetes mellitus with diabetic chronic kidney disease: Secondary | ICD-10-CM | POA: Diagnosis not present

## 2023-09-24 DIAGNOSIS — N184 Chronic kidney disease, stage 4 (severe): Secondary | ICD-10-CM | POA: Diagnosis not present

## 2023-09-24 DIAGNOSIS — E782 Mixed hyperlipidemia: Secondary | ICD-10-CM | POA: Diagnosis not present

## 2023-09-24 DIAGNOSIS — Z6829 Body mass index (BMI) 29.0-29.9, adult: Secondary | ICD-10-CM | POA: Diagnosis not present

## 2023-09-24 DIAGNOSIS — I10 Essential (primary) hypertension: Secondary | ICD-10-CM | POA: Diagnosis not present

## 2023-09-24 DIAGNOSIS — I482 Chronic atrial fibrillation, unspecified: Secondary | ICD-10-CM | POA: Diagnosis not present

## 2023-09-25 ENCOUNTER — Telehealth: Payer: Self-pay

## 2023-09-25 NOTE — Telephone Encounter (Signed)
 Medication reconciliation for upcoming cardiology appointment/

## 2023-09-26 ENCOUNTER — Encounter: Payer: Self-pay | Admitting: Internal Medicine

## 2023-09-26 ENCOUNTER — Ambulatory Visit: Payer: Medicare HMO | Attending: Internal Medicine | Admitting: Internal Medicine

## 2023-09-26 VITALS — BP 138/60 | HR 75 | Ht 64.0 in | Wt 175.2 lb

## 2023-09-26 DIAGNOSIS — I495 Sick sinus syndrome: Secondary | ICD-10-CM

## 2023-09-26 NOTE — Progress Notes (Signed)
 HPI Tracey Koch returns today for followup. She is a pleasant 78 yo woman with a h/o symptomatic bradycardia, s/p PPM, HTN, arthritis, and DM. In the interim, she has been stable. No chest pain. She has class 2-3 dyspnea. No syncope. She notes occaisional palpitations. She has some arthritic symptoms. She notes some peripheral edema. She continues to be tobacco free.  She has very mild claudication and dyspnea with exertion.   Allergies  Allergen Reactions   Penicillins Shortness Of Breath, Itching, Swelling and Rash    Has patient had a PCN reaction causing immediate rash, facial/tongue/throat swelling, SOB or lightheadedness with hypotension: Yes Has patient had a PCN reaction causing severe rash involving mucus membranes or skin necrosis: No Has patient had a PCN reaction that required hospitalization No Has patient had a PCN reaction occurring within the last 10 years: No If all of the above answers are "NO", then may proceed with Cephalosporin use.    Lisinopril Cough   Macrobid [Nitrofurantoin] Hives, Diarrhea, Itching and Nausea And Vomiting   Codeine Itching, Swelling and Rash   Levaquin [Levofloxacin In D5w] Itching, Swelling and Other (See Comments)    Can take low dose   Sulfa Drugs Cross Reactors Itching, Swelling and Rash     Current Outpatient Medications  Medication Sig Dispense Refill   apixaban (ELIQUIS) 5 MG TABS tablet Take 1 tablet (5 mg total) by mouth 2 (two) times daily. 60 tablet 11   Cranberry 450 MG TABS Take by mouth. 2 tablets daily     ferrous sulfate 325 (65 FE) MG tablet Take 325 mg by mouth in the morning.     fexofenadine (ALLEGRA) 180 MG tablet Take 180 mg by mouth daily.     furosemide (LASIX) 20 MG tablet Take 20 mg by mouth in the morning.     HYDROcodone-acetaminophen (NORCO) 7.5-325 MG tablet Take 1 tablet by mouth 2 (two) times daily as needed for severe pain. 30 tablet 0   losartan (COZAAR) 50 MG tablet Take 1 tablet (50 mg total) by  mouth daily. 30 tablet 3   metFORMIN (GLUCOPHAGE) 500 MG tablet Take 1,000 mg by mouth 2 (two) times daily with a meal.      Multiple Vitamin (MULTIVITAMIN) tablet Take 1 tablet by mouth daily. Centrum for Women     simvastatin (ZOCOR) 20 MG tablet Take 20 mg by mouth every evening.     solifenacin (VESICARE) 5 MG tablet Take 5 mg by mouth at bedtime.     traZODone (DESYREL) 150 MG tablet Take 150 mg by mouth at bedtime.     No current facility-administered medications for this visit.     Past Medical History:  Diagnosis Date   Anemia    takes Folic Acid daily   Arthritis    takes Plaquenil daily and Methotrexate 2 times a week   Cough    takes Allegra daily   Depression    takes Prozac at bedtime   Diabetes mellitus without complication (HCC)    takes Metformin daily   Family history of anesthesia complication    sister post op N/V   History of blood transfusion    History of UTI    Hyperlipidemia    takes Simvastatin nightly   Joint pain    Joint swelling    Pacemaker    Seasonal allergies    takes Allegra daily   Sinoatrial node dysfunction (HCC)    has ICD    ROS:  All systems reviewed and negative except as noted in the HPI.   Past Surgical History:  Procedure Laterality Date   ABDOMINAL HYSTERECTOMY     BIOPSY  10/27/2022   Procedure: BIOPSY;  Surgeon: Dolores Frame, MD;  Location: [facility];  Service: Gastroenterology;;   CHOLECYSTECTOMY     COLONOSCOPY     COLONOSCOPY WITH PROPOFOL N/A 11/21/2022   Procedure: COLONOSCOPY WITH PROPOFOL;  Surgeon: Dolores Frame, MD;  Location: [facility];  Service: Gastroenterology;  Laterality: N/A;  12:30PM;ASA 3   EP IMPLANTABLE DEVICE N/A 08/06/2015   Procedure: PPM Generator Changeout;  Surgeon: Marinus Maw, MD;  Location: [facility];  Service: Cardiovascular;  Laterality: N/A;   ESOPHAGOGASTRODUODENOSCOPY (EGD) WITH PROPOFOL N/A 10/27/2022   Procedure: ESOPHAGOGASTRODUODENOSCOPY  (EGD) WITH PROPOFOL;  Surgeon: Dolores Frame, MD;  Location: [facility];  Service: Gastroenterology;  Laterality: N/A;   HOT HEMOSTASIS  11/21/2022   Procedure: HOT HEMOSTASIS (ARGON PLASMA COAGULATION/BICAP);  Surgeon: Marguerita Merles, Reuel Boom, MD;  Location: [facility];  Service: Gastroenterology;;   INSERT / REPLACE / REMOVE PACEMAKER     POLYPECTOMY  11/21/2022   Procedure: POLYPECTOMY;  Surgeon: Dolores Frame, MD;  Location: [facility];  Service: Gastroenterology;;   right knee replacement     TOTAL KNEE ARTHROPLASTY Left 05/06/2013   Dr Cleophas Dunker   TOTAL KNEE ARTHROPLASTY Left 05/06/2013   Procedure: TOTAL KNEE ARTHROPLASTY;  Surgeon: [name], MD;  Location: [facility];  Service: Orthopedics;  Laterality: Left;     Family History  Problem Relation Age of Onset   COPD Mother    Stroke Father    Heart attack Father    Heart failure Sister    Diabetes Sister    CAD Brother    Diabetes Brother    Multiple sclerosis Sister    CAD Brother    Diabetes Brother    Renal cancer Brother      Social History   Socioeconomic History   Marital status: Married    Spouse name: Not on file   Number of children: Not on file   Years of education: Not on file   Highest education level: Not on file  Occupational History   Not on file  Tobacco Use   Smoking status: Former    Current packs/day: 0.00    Average packs/day: 1 pack/day for 58.7 years (58.7 ttl pk-yrs)    Types: Cigarettes    Start date: 03/01/1962    Quit date: 11/28/2020    Years since quitting: 2.8   Smokeless tobacco: Never  Vaping Use   Vaping status: Never Used  Substance and Sexual Activity   Alcohol use: No    Alcohol/week: 0.0 standard drinks of alcohol   Drug use: No   Sexual activity: Not Currently    Birth control/protection: Surgical  Other Topics Concern   Not on file  Social History Narrative   Not on file   Social Drivers of Health   Financial Resource Strain:  Not on file  Food Insecurity: No Food Insecurity (10/25/2022)   Hunger Vital Sign    Worried About Running Out of Food in the Last Year: Never true    Ran Out of Food in the Last Year: Never true  Transportation Needs: Unmet Transportation Needs (10/25/2022)   PRAPARE - Administrator, Civil Service (Medical): Yes    Lack of Transportation (Non-Medical): Yes  Physical Activity: Not on file  Stress:  Not on file  Social Connections: Not on file  Intimate Partner Violence: Not At Risk (10/25/2022)   Humiliation, Afraid, Rape, and Kick questionnaire    Fear of Current or Ex-Partner: No    Emotionally Abused: No    Physically Abused: No    Sexually Abused: No     BP (!) 146/72   Pulse 75   Ht 5\' 4"  (1.626 m)   Wt 175 lb 3.2 oz (79.5 kg)   SpO2 96%   BMI 30.07 kg/m   Physical Exam:  Well appearing NAD HEENT: Unremarkable Neck:  No JVD, no thyromegally Lymphatics:  No adenopathy Back:  No CVA tenderness Lungs:  Clear with no wheezes HEART:  Regular rate rhythm, 3/6 AS murmurs, no rubs, no clicks Abd:  soft, positive bowel sounds, no organomegally, no rebound, no guarding Ext:  2 plus pulses, no edema, no cyanosis, no clubbing Skin:  No rashes no nodules Neuro:  CN II through XII intact, motor grossly intact  EKG - nsr with atrial pacing  DEVICE  Normal device function.  See PaceArt for details.   Assess/Plan:  1. Tobacco abuse -she remains in remission. I encouraged her not to smoke. 2. HTN - her bp is up a bit today. She states that it is controlled at home. I encouraged her to reduce her salt intake. We will increase her diuretic. 3. PPM - her [employer] DDD PM is working normally. We will recheck in several months. 4. coags - she has not had any significant bleeding on her OAC. We will follow. 5. AS - her murmur is loud. A year ago she had a mean gradient of 12 mmHg. I plan to recheck her echo in another year, sooner if her symptoms worsen.  Sharlot Gowda  Arynn Armand,MD

## 2023-09-26 NOTE — Patient Instructions (Signed)

## 2023-10-01 LAB — CUP PACEART INCLINIC DEVICE CHECK
Date Time Interrogation Session: 20250226211241
Implantable Lead Connection Status: 753985
Implantable Lead Connection Status: 753985
Implantable Lead Implant Date: 20061110
Implantable Lead Implant Date: 20061110
Implantable Lead Location: 753859
Implantable Lead Location: 753860
Implantable Lead Model: 5076
Implantable Lead Model: 5076
Implantable Pulse Generator Implant Date: 20170106
Lead Channel Impedance Value: 459 Ohm
Lead Channel Impedance Value: 620 Ohm
Lead Channel Pacing Threshold Amplitude: 0.5 V
Lead Channel Pacing Threshold Amplitude: 0.75 V
Lead Channel Sensing Intrinsic Amplitude: 15.6 mV
Lead Channel Sensing Intrinsic Amplitude: 2 mV

## 2023-10-11 NOTE — Progress Notes (Signed)
 Remote pacemaker transmission.

## 2023-10-11 NOTE — Addendum Note (Signed)
 Addended by: Elease Etienne A on: 10/11/2023 01:20 PM   Modules accepted: Orders

## 2023-10-24 DIAGNOSIS — R609 Edema, unspecified: Secondary | ICD-10-CM | POA: Diagnosis not present

## 2023-10-24 DIAGNOSIS — Z6830 Body mass index (BMI) 30.0-30.9, adult: Secondary | ICD-10-CM | POA: Diagnosis not present

## 2023-10-24 DIAGNOSIS — R300 Dysuria: Secondary | ICD-10-CM | POA: Diagnosis not present

## 2023-10-24 DIAGNOSIS — N3941 Urge incontinence: Secondary | ICD-10-CM | POA: Diagnosis not present

## 2023-10-31 ENCOUNTER — Encounter: Payer: Self-pay | Admitting: Gastroenterology

## 2023-10-31 DIAGNOSIS — R051 Acute cough: Secondary | ICD-10-CM | POA: Diagnosis not present

## 2023-10-31 DIAGNOSIS — R5383 Other fatigue: Secondary | ICD-10-CM | POA: Diagnosis not present

## 2023-10-31 DIAGNOSIS — R300 Dysuria: Secondary | ICD-10-CM | POA: Diagnosis not present

## 2023-10-31 DIAGNOSIS — R8290 Unspecified abnormal findings in urine: Secondary | ICD-10-CM | POA: Diagnosis not present

## 2023-10-31 DIAGNOSIS — Z20828 Contact with and (suspected) exposure to other viral communicable diseases: Secondary | ICD-10-CM | POA: Diagnosis not present

## 2023-10-31 DIAGNOSIS — Z87440 Personal history of urinary (tract) infections: Secondary | ICD-10-CM | POA: Diagnosis not present

## 2023-10-31 DIAGNOSIS — R062 Wheezing: Secondary | ICD-10-CM | POA: Diagnosis not present

## 2023-10-31 DIAGNOSIS — Z6830 Body mass index (BMI) 30.0-30.9, adult: Secondary | ICD-10-CM | POA: Diagnosis not present

## 2023-12-19 DIAGNOSIS — I10 Essential (primary) hypertension: Secondary | ICD-10-CM | POA: Diagnosis not present

## 2023-12-19 DIAGNOSIS — N184 Chronic kidney disease, stage 4 (severe): Secondary | ICD-10-CM | POA: Diagnosis not present

## 2023-12-19 DIAGNOSIS — R5383 Other fatigue: Secondary | ICD-10-CM | POA: Diagnosis not present

## 2023-12-19 DIAGNOSIS — E7849 Other hyperlipidemia: Secondary | ICD-10-CM | POA: Diagnosis not present

## 2023-12-19 DIAGNOSIS — E782 Mixed hyperlipidemia: Secondary | ICD-10-CM | POA: Diagnosis not present

## 2023-12-19 DIAGNOSIS — E1122 Type 2 diabetes mellitus with diabetic chronic kidney disease: Secondary | ICD-10-CM | POA: Diagnosis not present

## 2023-12-26 DIAGNOSIS — N184 Chronic kidney disease, stage 4 (severe): Secondary | ICD-10-CM | POA: Diagnosis not present

## 2023-12-26 DIAGNOSIS — I482 Chronic atrial fibrillation, unspecified: Secondary | ICD-10-CM | POA: Diagnosis not present

## 2023-12-26 DIAGNOSIS — E782 Mixed hyperlipidemia: Secondary | ICD-10-CM | POA: Diagnosis not present

## 2023-12-26 DIAGNOSIS — I10 Essential (primary) hypertension: Secondary | ICD-10-CM | POA: Diagnosis not present

## 2023-12-26 DIAGNOSIS — Z6830 Body mass index (BMI) 30.0-30.9, adult: Secondary | ICD-10-CM | POA: Diagnosis not present

## 2024-01-22 ENCOUNTER — Ambulatory Visit (INDEPENDENT_AMBULATORY_CARE_PROVIDER_SITE_OTHER)

## 2024-01-22 DIAGNOSIS — I495 Sick sinus syndrome: Secondary | ICD-10-CM

## 2024-01-23 LAB — CUP PACEART REMOTE DEVICE CHECK
Battery Impedance: 832 Ohm
Battery Remaining Longevity: 85 mo
Battery Voltage: 2.79 V
Brady Statistic AP VP Percent: 0 %
Brady Statistic AP VS Percent: 9 %
Brady Statistic AS VP Percent: 0 %
Brady Statistic AS VS Percent: 91 %
Date Time Interrogation Session: 20250624172808
Implantable Lead Connection Status: 753985
Implantable Lead Connection Status: 753985
Implantable Lead Implant Date: 20061110
Implantable Lead Implant Date: 20061110
Implantable Lead Location: 753859
Implantable Lead Location: 753860
Implantable Lead Model: 5076
Implantable Lead Model: 5076
Implantable Pulse Generator Implant Date: 20170106
Lead Channel Impedance Value: 445 Ohm
Lead Channel Impedance Value: 619 Ohm
Lead Channel Setting Pacing Amplitude: 1.5 V
Lead Channel Setting Pacing Amplitude: 2 V
Lead Channel Setting Pacing Pulse Width: 0.4 ms
Lead Channel Setting Sensing Sensitivity: 5.6 mV
Zone Setting Status: 755011
Zone Setting Status: 755011

## 2024-01-27 ENCOUNTER — Ambulatory Visit: Payer: Self-pay | Admitting: Internal Medicine

## 2024-02-06 DIAGNOSIS — E785 Hyperlipidemia, unspecified: Secondary | ICD-10-CM | POA: Diagnosis not present

## 2024-02-06 DIAGNOSIS — I129 Hypertensive chronic kidney disease with stage 1 through stage 4 chronic kidney disease, or unspecified chronic kidney disease: Secondary | ICD-10-CM | POA: Diagnosis not present

## 2024-02-06 DIAGNOSIS — N184 Chronic kidney disease, stage 4 (severe): Secondary | ICD-10-CM | POA: Diagnosis not present

## 2024-02-06 DIAGNOSIS — I130 Hypertensive heart and chronic kidney disease with heart failure and stage 1 through stage 4 chronic kidney disease, or unspecified chronic kidney disease: Secondary | ICD-10-CM | POA: Diagnosis not present

## 2024-02-06 DIAGNOSIS — N390 Urinary tract infection, site not specified: Secondary | ICD-10-CM | POA: Diagnosis not present

## 2024-02-06 DIAGNOSIS — I509 Heart failure, unspecified: Secondary | ICD-10-CM | POA: Diagnosis not present

## 2024-02-07 ENCOUNTER — Encounter: Payer: Self-pay | Admitting: Nephrology

## 2024-02-07 ENCOUNTER — Other Ambulatory Visit: Payer: Self-pay | Admitting: Nephrology

## 2024-02-07 DIAGNOSIS — N184 Chronic kidney disease, stage 4 (severe): Secondary | ICD-10-CM

## 2024-02-08 ENCOUNTER — Ambulatory Visit
Admission: RE | Admit: 2024-02-08 | Discharge: 2024-02-08 | Disposition: A | Discharge status: Home / Self Care | Source: Ambulatory Visit | Attending: Nephrology | Admitting: Nephrology

## 2024-02-08 DIAGNOSIS — N184 Chronic kidney disease, stage 4 (severe): Secondary | ICD-10-CM

## 2024-02-08 DIAGNOSIS — N189 Chronic kidney disease, unspecified: Secondary | ICD-10-CM | POA: Diagnosis not present

## 2024-02-08 DIAGNOSIS — N281 Cyst of kidney, acquired: Secondary | ICD-10-CM | POA: Diagnosis not present

## 2024-02-29 DIAGNOSIS — R8271 Bacteriuria: Secondary | ICD-10-CM | POA: Diagnosis not present

## 2024-02-29 DIAGNOSIS — D4102 Neoplasm of uncertain behavior of left kidney: Secondary | ICD-10-CM | POA: Diagnosis not present

## 2024-02-29 DIAGNOSIS — N3021 Other chronic cystitis with hematuria: Secondary | ICD-10-CM | POA: Diagnosis not present

## 2024-03-24 DIAGNOSIS — E1129 Type 2 diabetes mellitus with other diabetic kidney complication: Secondary | ICD-10-CM | POA: Diagnosis not present

## 2024-03-24 DIAGNOSIS — E7849 Other hyperlipidemia: Secondary | ICD-10-CM | POA: Diagnosis not present

## 2024-03-24 DIAGNOSIS — E1122 Type 2 diabetes mellitus with diabetic chronic kidney disease: Secondary | ICD-10-CM | POA: Diagnosis not present

## 2024-03-24 DIAGNOSIS — N1832 Chronic kidney disease, stage 3b: Secondary | ICD-10-CM | POA: Diagnosis not present

## 2024-03-24 DIAGNOSIS — R5383 Other fatigue: Secondary | ICD-10-CM | POA: Diagnosis not present

## 2024-03-24 DIAGNOSIS — D649 Anemia, unspecified: Secondary | ICD-10-CM | POA: Diagnosis not present

## 2024-04-01 DIAGNOSIS — D649 Anemia, unspecified: Secondary | ICD-10-CM | POA: Diagnosis not present

## 2024-04-01 DIAGNOSIS — I10 Essential (primary) hypertension: Secondary | ICD-10-CM | POA: Diagnosis not present

## 2024-04-01 DIAGNOSIS — Z6830 Body mass index (BMI) 30.0-30.9, adult: Secondary | ICD-10-CM | POA: Diagnosis not present

## 2024-04-01 DIAGNOSIS — N184 Chronic kidney disease, stage 4 (severe): Secondary | ICD-10-CM | POA: Diagnosis not present

## 2024-04-01 DIAGNOSIS — I482 Chronic atrial fibrillation, unspecified: Secondary | ICD-10-CM | POA: Diagnosis not present

## 2024-04-22 ENCOUNTER — Ambulatory Visit (INDEPENDENT_AMBULATORY_CARE_PROVIDER_SITE_OTHER)

## 2024-04-22 DIAGNOSIS — I495 Sick sinus syndrome: Secondary | ICD-10-CM

## 2024-04-23 LAB — CUP PACEART REMOTE DEVICE CHECK
Battery Impedance: 884 Ohm
Battery Remaining Longevity: 82 mo
Battery Voltage: 2.78 V
Brady Statistic AP VP Percent: 0 %
Brady Statistic AP VS Percent: 10 %
Brady Statistic AS VP Percent: 0 %
Brady Statistic AS VS Percent: 90 %
Date Time Interrogation Session: 20250923113517
Implantable Lead Connection Status: 753985
Implantable Lead Connection Status: 753985
Implantable Lead Implant Date: 20061110
Implantable Lead Implant Date: 20061110
Implantable Lead Location: 753859
Implantable Lead Location: 753860
Implantable Lead Model: 5076
Implantable Lead Model: 5076
Implantable Pulse Generator Implant Date: 20170106
Lead Channel Impedance Value: 472 Ohm
Lead Channel Impedance Value: 638 Ohm
Lead Channel Setting Pacing Amplitude: 1.5 V
Lead Channel Setting Pacing Amplitude: 2 V
Lead Channel Setting Pacing Pulse Width: 0.4 ms
Lead Channel Setting Sensing Sensitivity: 5.6 mV
Zone Setting Status: 755011
Zone Setting Status: 755011

## 2024-04-23 NOTE — Progress Notes (Signed)
 Remote PPM Transmission

## 2024-04-27 ENCOUNTER — Ambulatory Visit: Payer: Self-pay | Admitting: Internal Medicine

## 2024-05-14 ENCOUNTER — Encounter (INDEPENDENT_AMBULATORY_CARE_PROVIDER_SITE_OTHER): Payer: Self-pay | Admitting: Gastroenterology

## 2024-06-17 ENCOUNTER — Other Ambulatory Visit: Payer: Self-pay

## 2024-06-17 DIAGNOSIS — I350 Nonrheumatic aortic (valve) stenosis: Secondary | ICD-10-CM

## 2024-07-22 ENCOUNTER — Encounter

## 2024-09-08 ENCOUNTER — Ambulatory Visit (HOSPITAL_COMMUNITY)

## 2024-09-12 ENCOUNTER — Ambulatory Visit: Admitting: Student in an Organized Health Care Education/Training Program

## 2024-10-21 ENCOUNTER — Encounter

## 2025-01-20 ENCOUNTER — Encounter

## 2025-04-21 ENCOUNTER — Encounter
